# Patient Record
Sex: Female | Born: 1960 | Race: Black or African American | Hispanic: No | Marital: Married | Smoking: Former smoker
Health system: Southern US, Community
[De-identification: ages and names within clinical notes are randomized; demographics above are authoritative.]

## PROBLEM LIST (undated history)

## (undated) DIAGNOSIS — K589 Irritable bowel syndrome without diarrhea: Secondary | ICD-10-CM

## (undated) HISTORY — DX: Irritable bowel syndrome without diarrhea: K58.9

---

## 1991-10-17 HISTORY — PX: GALLBLADDER SURGERY: SHX652

## 2016-10-16 HISTORY — PX: HYSTEROSCOPY: SHX211

## 2017-08-13 LAB — HM HEPATITIS C SCREENING LAB: HM Hepatitis Screen: NEGATIVE

## 2017-10-16 DIAGNOSIS — K589 Irritable bowel syndrome without diarrhea: Secondary | ICD-10-CM

## 2017-10-16 HISTORY — DX: Irritable bowel syndrome, unspecified: K58.9

## 2018-07-24 ENCOUNTER — Encounter: Payer: Self-pay | Admitting: Family Medicine

## 2018-07-24 ENCOUNTER — Other Ambulatory Visit (HOSPITAL_COMMUNITY)
Admission: RE | Admit: 2018-07-24 | Discharge: 2018-07-24 | Disposition: A | Discharge status: Home / Self Care | Payer: Managed Care, Other (non HMO) | Source: Ambulatory Visit | Attending: Obstetrics and Gynecology | Admitting: Obstetrics and Gynecology

## 2018-07-24 ENCOUNTER — Ambulatory Visit (INDEPENDENT_AMBULATORY_CARE_PROVIDER_SITE_OTHER): Payer: Managed Care, Other (non HMO) | Admitting: Family Medicine

## 2018-07-24 ENCOUNTER — Encounter (INDEPENDENT_AMBULATORY_CARE_PROVIDER_SITE_OTHER): Payer: Self-pay

## 2018-07-24 ENCOUNTER — Encounter: Payer: Self-pay | Admitting: Obstetrics and Gynecology

## 2018-07-24 ENCOUNTER — Ambulatory Visit (INDEPENDENT_AMBULATORY_CARE_PROVIDER_SITE_OTHER): Payer: Managed Care, Other (non HMO) | Admitting: Obstetrics and Gynecology

## 2018-07-24 VITALS — BP 118/68 | HR 79 | Temp 98.6°F | Resp 16 | Ht 66.0 in | Wt 222.0 lb

## 2018-07-24 VITALS — BP 121/83 | HR 87 | Ht 66.0 in | Wt 222.0 lb

## 2018-07-24 DIAGNOSIS — R609 Edema, unspecified: Secondary | ICD-10-CM | POA: Diagnosis not present

## 2018-07-24 DIAGNOSIS — N951 Menopausal and female climacteric states: Secondary | ICD-10-CM

## 2018-07-24 DIAGNOSIS — K581 Irritable bowel syndrome with constipation: Secondary | ICD-10-CM

## 2018-07-24 DIAGNOSIS — E669 Obesity, unspecified: Secondary | ICD-10-CM

## 2018-07-24 DIAGNOSIS — Z1239 Encounter for other screening for malignant neoplasm of breast: Secondary | ICD-10-CM

## 2018-07-24 DIAGNOSIS — Z7689 Persons encountering health services in other specified circumstances: Secondary | ICD-10-CM

## 2018-07-24 DIAGNOSIS — Z0000 Encounter for general adult medical examination without abnormal findings: Secondary | ICD-10-CM

## 2018-07-24 DIAGNOSIS — L821 Other seborrheic keratosis: Secondary | ICD-10-CM

## 2018-07-24 MED ORDER — HYDROCHLOROTHIAZIDE 25 MG PO TABS: 25.0000 mg | ORAL_TABLET | Freq: Every day | ORAL | 5 refills | Status: DC

## 2018-07-24 NOTE — Progress Notes (Signed)
Subjective:    Patient ID: Kara Porter, female    DOB: Dec 11, 1960, 57 y.o.   MRN: [id number]  Kara Porter is a 57 y.o. female presenting on 07/24/2018 for Establish Care and Irritable Bowel Syndrome  Establish care with  New PCP today. Previously she was in [place] for 14 years, then [place] 1 yr, then [place]. Prior PCP Dr Orvil Feil through [facility] in [place].   HPI   Chronic IBS / History of prior Fatigue/Joint Pain / Fluid Retention Prior PCP Dr Orvil Feil thought maybe Fibromyalgia, trial few medicines in past, limited results. She did not believe this diagnosis, she had difficulty walking and pain, she has changed lifestyle to improve her health through nutrition switched to FODMAP diet, to help her mental and physical health. Improved her health with reduced inflammation, able to lose some weight. Now working on exercise regimen now with walking regularly low intensity 20-65min daily 5 x week, joined a gym and plan to  - Down 24 lbs approx - Diet changes has helped a whole lot, with reduced swelling and pain, most of her pain was in areas of knees, back, hip - Does not take in salt much but she retains fluid, on HCTZ not for HTN, she has had normal BP, has monitored it before. - Overall she has dramatically improved and continues to do better on current lifestyle regimen  Former Smoker She quit smoking in past with trials of chantix and NRT patches multiple times.  IBS Prior dx from previous PCP mostly constipation type prior to treatments and diet changes now improved background info - Since followed by nutritionist has improved diet and eliminated gluten and improved FODMAPs diet, she has also had issues with constipation. She has been taking OTC Metamucil and Miralax regularly for past 1-2 years, with good results. Previously she would have constipation q 3-4 days, now doing better more regular. Asking how long she can use this regimen. She drinks water or sparkling water and stopped drinking  soda. 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 She does not need referral ordered to derm, she plans to call them to schedule for routine skin check Additional social history She is married and her husband is living with his mother as caregiver temporarily, about 2 hr away.  Child in [place] and one in [place] Maintenance:  UTD screen Hep C 08/13/17  Breast CA Screen - followed by GYN - prior mammo through [facility], now ordered for [facility], prior abnormal w/ cystic breast findings usual require repeat.  Due TDap - will think on this and check records.  Flu shot at work, will send [place] copy of record.  Prior colonoscopy, see above - will request record from [facility]  DEXA per GYN  Depression screen PHQ 2/9 07/24/2018  Decreased Interest 0  Down, Depressed, Hopeless 0  PHQ - 2 Score 0    Past Medical History:  Diagnosis Date  . Irritable bowel syndrome  2019   Past Surgical History:  Procedure Laterality Date  . [place]  . [place]  . HYSTEROSCOPY  2018   Social History   Socioeconomic History  . Marital status: Married    Spouse name: Not on file  . Number of children: 2  . Years of education: College  . Highest education level: Bachelor's degree (e.g., BA, AB, BS)  Occupational History  . Occupation: HR Manager    Comment: [employer]  Social Needs  . Financial resource strain: Not on file  . Food insecurity:     Worry: Not on file    Inability: Not on file  . Transportation needs:    Medical: Not on file    Non-medical: Not on file  Tobacco Use  . Smoking status: Former Smoker    Packs/day: 0.75    Years: 6.00    Pack years: 4.50    Types: Cigarettes    Last attempt to quit: 2018    Years since quitting: 1.7  . Smokeless tobacco: Former Network engineer and Sexual Activity  . Alcohol use: Not Currently    Frequency: Never  . Drug use: Never  . Sexual activity: Not Currently    Birth control/protection: None  Lifestyle  . Physical activity:    Days per week: 5 days    Minutes per session: Not on file  . Stress: Rather much  Relationships  . Social connections:    Talks on phone: Three times a week    Gets together: Three times a week    Attends religious service: Not on file    Active member of club or organization: Not on file    Attends meetings of clubs or organizations: Not on file    Relationship status: Married  . Intimate partner violence:    Fear of current or ex partner: No    Emotionally abused: No    Physically abused: No    Forced sexual activity: No  Other Topics Concern  . Not on file  Social History Narrative  . Not on file   Family History  Problem Relation Age of Onset  . Hyperlipidemia Mother   . Heart disease Mother   . Cancer Father   . Hyperlipidemia Father    Current Outpatient Medications on File Prior to Visit  Medication Sig  . valACYclovir (VALTREX) 500 MG tablet Take 500 mg by mouth 3 (three) times daily as needed. PRN herpes flare   No current facility-administered medications on file prior to visit.     Review of Systems Per HPI unless specifically indicated above     Objective:    BP 118/68   Pulse 79   Temp 98.6 F (37 C) (Oral)   Resp 16   Ht 5\' 6"  (1.676 m)   Wt 222 lb (100.7 kg)   BMI 35.83 kg/m   Wt Readings from Last 3 Encounters:  07/24/18 222 lb (100.7 kg)  07/24/18 222 lb (100.7 kg)    Physical Exam    Constitutional: She is oriented to person, place, and time. She appears well-developed and well-nourished. No distress.  Well-appearing, comfortable, cooperative, obese  HENT:  Head: Normocephalic and atraumatic.  Mouth/Throat: Oropharynx is clear and moist.  Eyes: Conjunctivae are normal. Right eye exhibits no discharge. Left eye exhibits no discharge.  Cardiovascular: Normal rate.  Pulmonary/Chest: Effort normal.  Musculoskeletal: She exhibits no edema.  Neurological: She is alert and oriented to person, place, and time.  Skin: Skin is warm and dry. No rash noted. She is not diaphoretic. No erythema.  Several areas of freckles of skin face cheek area - some slightly raised SK appearing lesions, similar on neck, non tender, no asymmetry or abnormality  Psychiatric: She has a normal mood and affect. Her behavior is normal.  Well groomed, good eye contact, normal speech and thoughts  Nursing note and vitals reviewed.  Results for orders placed or performed in visit on 07/24/18  HM HEPATITIS C SCREENING LAB  Result Value Ref Range   HM Hepatitis Screen Negative-Validated       Assessment & Plan:   Problem List Items Addressed This Visit    Fluid retention Stable chronic issue related to inflammation by her report within body, does not seem to be other cause, improved and controlled on thiazide    Relevant Medications   hydrochlorothiazide (HYDRODIURIL) 25 MG tablet   Irritable bowel syndrome - Primary Primarily constipation type, now well controlled on current diet, lifestyle and med regimen metamucil/miralax for prevention - Follow-up results of prior colonoscopy - request record - In future if need further assistance / workup will consult GI    Obesity (BMI 35.0-39.9 without comorbidity) Gradual wt loss with lifestyle changes Encourage keep up the good work    Seborrheic keratoses Skin findings mostly consistent with benign SKs, but I support her establishing with new  Dermatology for routine surveillance with fam history of skin cancer     Other Visit Diagnoses    Encounter to establish care with new doctor     Request record from [facility] PCP and review in chart other record    Screening for breast cancer       Followed by GYN - they have ordered routine Mammo at [facility], advised patient we can send fax release to [facility] for her results from [facility], needs to sign      Meds ordered this encounter  Medications  . hydrochlorothiazide (HYDRODIURIL) 25 MG tablet    Sig: Take 1 tablet (25 mg total) by mouth daily.    Dispense:  30 tablet    Refill:  5    Patient has changed PCP from Dr Orvil Feil to myself, please update rx on file, fill when ready    Follow up plan: Return in about 4 weeks (around 08/21/2018) for Annual Physical.  Future labs will be ordered for 08/21/18  Nobie Putnam, [facility] 07/24/2018, 4:00 PM

## 2018-07-24 NOTE — Progress Notes (Signed)
HPI:      [name] is a 57 y.o. [id number] who LMP was Patient's last menstrual period was 11/24/2017 (exact date).  Subjective:   She presents today for her annual examination.  She has no specific complaints.  She has only had 2 periods in 2019.  She previously had an endometrial ablation for heavy bleeding and cramping.  She has also had fibroidectomy's for previous fibroids.  She complains of night sweats but denies daily hot flashes. She is up-to-date on mammography and colonoscopy.  She has never had a DEXA scan. She is seeing her family doctor later today who does most of her annual exam blood work.    Hx: The following portions of the patient's history were reviewed and updated as appropriate:             She  has a past medical history of Irritable bowel syndrome (2019). She does not have a problem list on file. She  has a past surgical history that includes Cesarean section (1991 and 1997); Gallbladder surgery (1993); and Hysteroscopy (2018). Her family history includes Cancer in her father; Heart disease in her mother; Hyperlipidemia in her father and mother. She  reports that she quit smoking about 21 months ago. She has never used smokeless tobacco. She reports that she drank alcohol. She reports that she does not use drugs. She has a current medication list which includes the following prescription(s): hydrochlorothiazide, multivitamin, polyethylene glycol, and psyllium. She has no allergies on file.       Review of Systems:  Review of Systems  Constitutional: Denied constitutional symptoms, night sweats, recent illness, fatigue, fever, insomnia and weight loss.  Eyes: Denied eye symptoms, eye pain, photophobia, vision change and visual disturbance.  Ears/Nose/Throat/Neck: Denied ear, nose, throat or neck symptoms, hearing loss, nasal discharge, sinus congestion and sore throat.  Cardiovascular: Denied cardiovascular symptoms, arrhythmia, chest pain/pressure, edema,  exercise intolerance, orthopnea and palpitations.  Respiratory: Denied pulmonary symptoms, asthma, pleuritic pain, productive sputum, cough, dyspnea and wheezing.  Gastrointestinal: Denied, gastro-esophageal reflux, melena, nausea and vomiting.  Genitourinary: Denied genitourinary symptoms including symptomatic vaginal discharge, pelvic relaxation issues, and urinary complaints.  Musculoskeletal: Denied musculoskeletal symptoms, stiffness, swelling, muscle weakness and myalgia.  Dermatologic: Denied dermatology symptoms, rash and scar.  Neurologic: Denied neurology symptoms, dizziness, headache, neck pain and syncope.  Psychiatric: Denied psychiatric symptoms, anxiety and depression.  Endocrine: Denied endocrine symptoms including hot flashes and night sweats.   Meds:   Current Outpatient Medications on File Prior to Visit  Medication Sig Dispense Refill  . hydrochlorothiazide (HYDRODIURIL) 25 MG tablet Take 25 mg by mouth daily.    . Multiple Vitamin (MULTIVITAMIN) LIQD Take 5 mLs by mouth daily.    . polyethylene glycol (MIRALAX / GLYCOLAX) packet Take 17 g by mouth daily.    . psyllium (METAMUCIL) 58.6 % packet Take 1 packet by mouth daily.     No current facility-administered medications on file prior to visit.     Objective:     Vitals:   07/24/18 1016  BP: 121/83  Pulse: 87              Physical examination General NAD, Conversant  HEENT Atraumatic; Op clear with mmm.  Normo-cephalic. Pupils reactive. Anicteric sclerae  Thyroid/Neck Smooth without nodularity or enlargement. Normal ROM.  Neck Supple.  Skin No rashes, lesions or ulceration. Normal palpated skin turgor. No nodularity.  Breasts: No masses or discharge.  Symmetric.  No axillary adenopathy.  Lungs: Clear to  auscultation.No rales or wheezes. Normal Respiratory effort, no retractions.  Heart: NSR.  No murmurs or rubs appreciated. No periferal edema  Abdomen: Soft.  Non-tender.  No masses.  No HSM. No hernia   Extremities: Moves all appropriately.  Normal ROM for age. No lymphadenopathy.  Neuro: Oriented to PPT.  Normal mood. Normal affect.     Pelvic:   Vulva: Normal appearance.  No lesions.  Vagina: No lesions or abnormalities noted.  Support: Normal pelvic support.  Urethra No masses tenderness or scarring.  Meatus Normal size without lesions or prolapse.  Cervix: Normal appearance.  No lesions.  Anus: Normal exam.  No lesions.  Perineum: Normal exam.  No lesions.        Bimanual   Uterus: Normal size.  Non-tender.  Mobile.  AV.  Adnexae: No masses.  Non-tender to palpation.  Cul-de-sac: Negative for abnormality.    Annual examination limited by patient body habitus.  Assessment:    [id number] There are no active problems to display for this patient.    1. Encounter for annual physical exam   2. Symptomatic menopausal or female climacteric states        Plan:            1.  Basic Screening Recommendations The basic screening recommendations for asymptomatic women were discussed with the patient during her visit.  The age-appropriate recommendations were discussed with her and the rational for the tests reviewed.  When I am informed by the patient that another primary care physician has previously obtained the age-appropriate tests and they are up-to-date, only outstanding tests are ordered and referrals given as necessary.  Abnormal results of tests will be discussed with her when all of her results are completed. Pap performed-co-test Mammogram ordered DEXA ordered Colonoscopy-patient says she is up-to-date  2.  FSH to rule in or out menopause.  Will discuss HRT if elevated FSH noted.  Orders No orders of the defined types were placed in this encounter.   No orders of the defined types were placed in this encounter.       F/U  Return for We will contact her with any abnormal test results.  Finis Bud, M.D. 07/24/2018 10:30 AM

## 2018-07-24 NOTE — Progress Notes (Signed)
Pt is here today for annual exam. Pt has had irregular periods for 3 years, her LMP was 11/2017. Pt's last pap was in 07/2017 and results were normal. Pt requests a referral for a mammogram.

## 2018-07-24 NOTE — Patient Instructions (Addendum)
Thank you for coming to the office today.  Keep up the good work overall  See if you can help [place] request the record for the Colonoscopy from [place] from 2014. Call them directly or let us know exactly where to send the release form.  Notify us when you get your Flu Shot for our records.  We will bill a preventative code at next visit FYI for the blood work.  [place] Dr. Ree Edman   [address], [place], [place] [address] Phone: [phone]  ---------------------------------------------- [place]   [place], [place] [address] Hours: 8AM-5PM Phone: [phone]  ----------------------------------------------  [facility] [place], [place] [address] Phone: [phone]   DUE for FASTING BLOOD WORK (no food or drink after midnight before the lab appointment, only water or coffee without cream/sugar on the morning of)  SCHEDULE "Lab Only" visit in the morning at the clinic for lab draw in [place]   - Make sure Lab Only appointment is at about 1 week before your next appointment, so that results will be available  For Lab Results, once available within 2-3 days of blood draw, you can can log in to MyChart online to view your results and a brief explanation. Also, we can discuss results at next follow-up visit.   Please schedule a Follow-up Appointment to: Return in about 4 weeks (around 08/21/2018) for Annual Physical.  If you have any other questions or concerns, please feel free to call the office or send a message through [place]. You may also schedule an earlier appointment if necessary.  Additionally, you may be receiving a survey about your experience at our office within a few days to 1 week by e-mail or mail. We value your feedback.  Nobie Putnam, DO [facility]

## 2018-07-24 NOTE — Addendum Note (Signed)
Addended by: Elmer Picker M on: 07/24/2018 11:21 AM   Modules accepted: Orders

## 2018-07-25 ENCOUNTER — Telehealth: Payer: Self-pay | Admitting: Family Medicine

## 2018-07-25 ENCOUNTER — Other Ambulatory Visit: Payer: Self-pay | Admitting: Family Medicine

## 2018-07-25 DIAGNOSIS — R609 Edema, unspecified: Secondary | ICD-10-CM

## 2018-07-25 DIAGNOSIS — E7800 Pure hypercholesterolemia, unspecified: Secondary | ICD-10-CM | POA: Insufficient documentation

## 2018-07-25 DIAGNOSIS — E669 Obesity, unspecified: Secondary | ICD-10-CM

## 2018-07-25 DIAGNOSIS — Z0000 Encounter for general adult medical examination without abnormal findings: Secondary | ICD-10-CM

## 2018-07-25 DIAGNOSIS — K581 Irritable bowel syndrome with constipation: Secondary | ICD-10-CM

## 2018-07-25 LAB — FOLLICLE STIMULATING HORMONE: FSH: 27.3 m[IU]/mL

## 2018-07-25 NOTE — Telephone Encounter (Signed)
Attempted to call patient back. She needs to sign a [employer] Form so that we can fax it back to [place] and they can request her records of prior mammogram from [facility].  Form is completed and in the folder at nursing station for her next appointment in November 2019.  If she prefers we can mail it or fax it to her, and all she has to do is [place] to [place].  Or she can come by and sign it and WE can fax it to [place].  Or she can wait until her November appointment and we can have her sign it here and then we can fax it.  Nobie Putnam, DO [facility] 07/25/2018, 1:55 PM

## 2018-07-26 ENCOUNTER — Telehealth: Payer: Self-pay

## 2018-07-26 LAB — CYTOLOGY - PAP
DIAGNOSIS: NEGATIVE
HPV (WINDOPATH): NOT DETECTED

## 2018-07-26 NOTE — Telephone Encounter (Signed)
Unable to reach pt on the phone, will try again later today.

## 2018-07-29 NOTE — Telephone Encounter (Signed)
Left Vm for patient to return call

## 2018-07-30 ENCOUNTER — Telehealth: Payer: Self-pay | Admitting: Obstetrics and Gynecology

## 2018-07-30 NOTE — Telephone Encounter (Signed)
Informed pt of pap results and advised her to make an appt with Dr. Amalia Hailey to discuss menopause. Pt will call back to make an appt after reviewing her schedule.

## 2018-07-30 NOTE — Telephone Encounter (Signed)
Patient returned your call.  Thanks!

## 2018-08-21 ENCOUNTER — Other Ambulatory Visit: Payer: Managed Care, Other (non HMO)

## 2018-08-21 DIAGNOSIS — K581 Irritable bowel syndrome with constipation: Secondary | ICD-10-CM

## 2018-08-21 DIAGNOSIS — Z0000 Encounter for general adult medical examination without abnormal findings: Secondary | ICD-10-CM

## 2018-08-21 DIAGNOSIS — R609 Edema, unspecified: Secondary | ICD-10-CM

## 2018-08-21 DIAGNOSIS — E7800 Pure hypercholesterolemia, unspecified: Secondary | ICD-10-CM

## 2018-08-21 DIAGNOSIS — E669 Obesity, unspecified: Secondary | ICD-10-CM

## 2018-08-22 LAB — COMPLETE METABOLIC PANEL WITH GFR
AG Ratio: 1.6 (calc) (ref 1.0–2.5)
ALBUMIN MSPROF: 4.4 g/dL (ref 3.6–5.1)
ALKALINE PHOSPHATASE (APISO): 100 U/L (ref 33–130)
ALT: 18 U/L (ref 6–29)
AST: 17 U/L (ref 10–35)
BILIRUBIN TOTAL: 0.4 mg/dL (ref 0.2–1.2)
BUN: 12 mg/dL (ref 7–25)
CHLORIDE: 103 mmol/L (ref 98–110)
CO2: 28 mmol/L (ref 20–32)
CREATININE: 0.65 mg/dL (ref 0.50–1.05)
Calcium: 9.4 mg/dL (ref 8.6–10.4)
GFR, Est African American: 116 mL/min/{1.73_m2} (ref >=60)
GFR, Est Non African American: 100 mL/min/{1.73_m2} (ref >=60)
GLUCOSE: 100 mg/dL — AB (ref 65–99)
Globulin: 2.8 g/dL (calc) (ref 1.9–3.7)
Potassium: 3.9 mmol/L (ref 3.5–5.3)
Sodium: 138 mmol/L (ref 135–146)
Total Protein: 7.2 g/dL (ref 6.1–8.1)

## 2018-08-22 LAB — CBC WITH DIFFERENTIAL/PLATELET
Basophils Absolute: 34 cells/uL (ref 0–200)
Basophils Relative: 0.4 %
EOS PCT: 0.6 %
Eosinophils Absolute: 51 cells/uL (ref 15–500)
HEMATOCRIT: 40.5 % (ref 35.0–45.0)
HEMOGLOBIN: 13.2 g/dL (ref 11.7–15.5)
LYMPHS ABS: 2601 {cells}/uL (ref 850–3900)
MCH: 30 pg (ref 27.0–33.0)
MCHC: 32.6 g/dL (ref 32.0–36.0)
MCV: 92 fL (ref 80.0–100.0)
MPV: 9.9 fL (ref 7.5–12.5)
Monocytes Relative: 6 %
NEUTROS ABS: 5304 {cells}/uL (ref 1500–7800)
NEUTROS PCT: 62.4 %
Platelets: 429 10*3/uL — ABNORMAL HIGH (ref 140–400)
RBC: 4.4 10*6/uL (ref 3.80–5.10)
RDW: 12.7 % (ref 11.0–15.0)
Total Lymphocyte: 30.6 %
WBC: 8.5 10*3/uL (ref 3.8–10.8)
WBCMIX: 510 {cells}/uL (ref 200–950)

## 2018-08-22 LAB — HEMOGLOBIN A1C
Hgb A1c MFr Bld: 5.7 % of total Hgb — ABNORMAL HIGH (ref <5.7)
MEAN PLASMA GLUCOSE: 117 (calc)
eAG (mmol/L): 6.5 (calc)

## 2018-08-22 LAB — LIPID PANEL
CHOL/HDL RATIO: 4.5 (calc) (ref <5.0)
Cholesterol: 221 mg/dL — ABNORMAL HIGH (ref <200)
HDL: 49 mg/dL — ABNORMAL LOW (ref >50)
LDL Cholesterol (Calc): 145 mg/dL (calc) — ABNORMAL HIGH
NON-HDL CHOLESTEROL (CALC): 172 mg/dL — AB (ref <130)
TRIGLYCERIDES: 142 mg/dL (ref <150)

## 2018-08-22 LAB — TSH: TSH: 1.44 m[IU]/L

## 2018-08-22 LAB — T4, FREE: Free T4: 0.9 ng/dL (ref 0.8–1.8)

## 2018-08-25 ENCOUNTER — Encounter: Payer: Self-pay | Admitting: Family Medicine

## 2018-08-25 DIAGNOSIS — R7303 Prediabetes: Secondary | ICD-10-CM | POA: Insufficient documentation

## 2018-08-25 DIAGNOSIS — R7309 Other abnormal glucose: Secondary | ICD-10-CM | POA: Insufficient documentation

## 2018-08-25 DIAGNOSIS — E1169 Type 2 diabetes mellitus with other specified complication: Secondary | ICD-10-CM | POA: Insufficient documentation

## 2018-08-28 ENCOUNTER — Encounter: Payer: Self-pay | Admitting: Family Medicine

## 2018-08-28 ENCOUNTER — Ambulatory Visit (INDEPENDENT_AMBULATORY_CARE_PROVIDER_SITE_OTHER): Payer: Managed Care, Other (non HMO) | Admitting: Family Medicine

## 2018-08-28 VITALS — BP 111/62 | HR 79 | Temp 98.5°F | Resp 16 | Ht 66.0 in | Wt 223.0 lb

## 2018-08-28 DIAGNOSIS — K581 Irritable bowel syndrome with constipation: Secondary | ICD-10-CM

## 2018-08-28 DIAGNOSIS — R7309 Other abnormal glucose: Secondary | ICD-10-CM | POA: Diagnosis not present

## 2018-08-28 DIAGNOSIS — Z0000 Encounter for general adult medical examination without abnormal findings: Secondary | ICD-10-CM

## 2018-08-28 DIAGNOSIS — E7800 Pure hypercholesterolemia, unspecified: Secondary | ICD-10-CM

## 2018-08-28 DIAGNOSIS — E669 Obesity, unspecified: Secondary | ICD-10-CM | POA: Diagnosis not present

## 2018-08-28 DIAGNOSIS — L239 Allergic contact dermatitis, unspecified cause: Secondary | ICD-10-CM

## 2018-08-28 DIAGNOSIS — D75839 Thrombocytosis, unspecified: Secondary | ICD-10-CM

## 2018-08-28 DIAGNOSIS — D473 Essential (hemorrhagic) thrombocythemia: Secondary | ICD-10-CM

## 2018-08-28 MED ORDER — TRIAMCINOLONE ACETONIDE 0.1 % EX CREA: 1.0000 "application " | TOPICAL_CREAM | Freq: Two times a day (BID) | CUTANEOUS | 0 refills | Status: DC | PRN

## 2018-08-28 NOTE — Assessment & Plan Note (Signed)
Weight stable Encourage improved lifestyle

## 2018-08-28 NOTE — Assessment & Plan Note (Signed)
Controlled cholesterol on lifestyle Last lipid panel 08/2018 Calculated ASCVD 10 yr risk score 3%  Plan: 1. Encourage improved lifestyle - low carb/cholesterol, reduce portion size, continue improving regular exercise Follow-up yearly lipid

## 2018-08-28 NOTE — Assessment & Plan Note (Signed)
Clinically stable chronic IBS-C Improved on diet changes/lifestyle Gluten free FODMAPS On miralax, metamucil  Future refer to GI in future if needed

## 2018-08-28 NOTE — Patient Instructions (Addendum)
Thank you for coming to the office today.  Keep up the good work!  Cholesterol is improved from last year. Still slightly elevated LDL. But towards the goal.  A1c 5.7, borderline Pre-Diabetic still. Keep improving diet / activity / weight loss.  Will follow-up Mammogram and DEXA once completed.  Discuss hormone with GYN as well - my preference would be to avoid if you are not having significant symptoms.  Please schedule a Follow-up Appointment to: Return in about 6 months (around 02/26/2019) for 6 month follow-up PreDM A1c, Weight, IBS.  If you have any other questions or concerns, please feel free to call the office or send a message through [facility]. You may also schedule an earlier appointment if necessary.  Additionally, you may be receiving a survey about your experience at our office within a few days to 1 week by e-mail or mail. We value your feedback.  Nobie Putnam, DO [place]

## 2018-08-28 NOTE — Assessment & Plan Note (Addendum)
Stable A1c 5.7, no prior readings Concern with obesity, HLD  Plan:  1. Not on any therapy currently  2. Encourage improved lifestyle - low carb, low sugar diet, reduce portion size, continue improving regular exercise 3. Follow-up 6 month PreDM A1c

## 2018-08-28 NOTE — Assessment & Plan Note (Signed)
Mild elevated plt, 400-430 range, stable for past >2 years Asymptomatic Otherwise normal CBC  Follow yearly CBC or sooner if abnormal symptoms or new concern. Consider path smear vs hematology if need

## 2018-08-28 NOTE — Progress Notes (Signed)
Subjective:    Patient ID: Kara Porter, female    DOB: Mar 07, 1961, 57 y.o.   MRN: [id number]  Kara Porter is a 57 y.o. female presenting on 08/28/2018 for Annual Exam   HPI   Here for Annual Physical and Lab Review.  IBS Prior dx from previous PCP mostly constipation type prior to treatments and diet changes now improved background info - Since followed by nutritionist has improved diet and eliminated gluten and improved FODMAPs diet, she has also had issues with constipation. She has been taking OTC Metamucil and Miralax regularly for past 1-2 years, with good results. - Today reports doing well. Limited flares of constipation  Former Smoker She quit smoking in past with trials of chantix and NRT patches multiple times.  Pre-Diabetes / Obesity BMI >35 Reports prior history elevated A1c mild in past, no result available. Now recent reading 5.7 CBGs: Not checking sugar Meds: Never on Currently not on ACEi/ARB Denies hypoglycemia  Seasonal Allergic Rhinitis Improved on Tylenol Sinus PRN, Flonase, and occasional Claritin, uses Neti Pot PRN  HYPERLIPIDEMIA: - Reports no concerns. Last lipid panel 08/2018, normal HDL and TG, slightly elevated Total Cholesterol and LDL 145, but improved from 2018 with LDL 165 - Not on cholesterol medicine  Thrombocytosis, elevated platelets Prior history of mild elevated platelets for past 2 years+, with range 400 to 430. She was unaware of this result. 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 - She has not scheduled yet with Dermatology - She has one area on  her chest wall between breasts with two itchy spots, had flaking skin, had issue with fungal infection of skin already treated with powder improved within skin fold beneath breasts, due to moisture  PMH - History of prior Fatigue/Joint Pain / Fluid Retention - see prior note for background history, improved on FODMAP diet and gluten free.  Post menopause Followed by [facility], Dr Amalia Hailey, has seen recently, they did lab testing for menopause and patient will follow-up to discuss HRT. She is less interested in medication at this time because she does not endorse significant post menopausal symptoms. Only occasional sweat and some wt gain, otherwise doing well.   Health Maintenance:  UTD screen Hep C 08/13/17  Breast CA Screen - followed by GYN - prior mammo through [facility], now ordered for [facility], prior abnormal w/ cystic breast findings usual require repeat. - SCHEDULED for 09/18/18  Due TDap - declines today  Flu shot at work, will send [place] copy of record - this month 08/2018  Prior colonoscopy, see above - already requested record from [facility] did not receive copy yet. She states it was done past few years, no polyps, next due reported at age 50.  She is already scheduled for DEXA and Mammogram 09/18/2018   Depression screen PHQ 2/9 08/28/2018 07/24/2018  Decreased Interest 0 0  Down, Depressed, Hopeless 0 0  PHQ - 2 Score 0 0    Past Medical History:  Diagnosis Date  . Irritable bowel syndrome 2019   Past Surgical History:  Procedure Laterality  Date  . [place] and 1997  . [place]  . HYSTEROSCOPY  2018   Social History   Socioeconomic History  . Marital status: Married    Spouse name: Not on file  . Number of children: 2  . Years of education: College  . Highest education level: Bachelor's degree (e.g., BA, AB, BS)  Occupational History  . Occupation: HR Manager    Comment: [employer]  Social Needs  .  Financial resource strain: Not on file  . Food insecurity:    Worry: Not on file    Inability: Not on file  . Transportation needs:    Medical: Not on file    Non-medical: Not on file  Tobacco Use  . Smoking status: Former Smoker    Packs/day: 0.75    Years: 6.00    Pack years: 4.50    Types: Cigarettes    Last attempt to quit: 2018    Years since quitting: 1.8  . Smokeless tobacco: Former Network engineer and Sexual Activity  . Alcohol use: Not Currently    Frequency: Never  . Drug use: Never  . Sexual activity: Not Currently    Birth control/protection: None  Lifestyle  . Physical activity:    Days per week: 5 days    Minutes per session: Not on file  . Stress: Rather much  Relationships  . Social connections:    Talks on phone: Three times a week    Gets together: Three times a week    Attends religious service: Not on file    Active member of club or organization: Not on file    Attends meetings of clubs or organizations: Not on file    Relationship status: Married  . Intimate partner violence:    Fear of current or ex partner: No    Emotionally abused: No    Physically abused: No    Forced sexual activity: No  Other Topics Concern  . Not on file  Social History Narrative  . Not on file   Family History  Problem Relation Age of Onset  . Hyperlipidemia Mother   . Heart disease Mother   . Cancer Father   . Hyperlipidemia Father    Current Outpatient Medications on File Prior to Visit  Medication Sig  . hydrochlorothiazide (HYDRODIURIL) 25 MG tablet Take 1 tablet (25 mg total) by mouth daily.  . Multiple Vitamin (MULTIVITAMIN) LIQD Take 5 mLs by mouth daily.  . polyethylene glycol (MIRALAX / GLYCOLAX) packet Take 17 g by mouth daily.  . psyllium (METAMUCIL) 58.6 % packet Take 1 packet by mouth daily.  . valACYclovir (VALTREX) 500 MG tablet Take 500 mg by mouth 3 (three) times daily as needed. PRN herpes flare   No current facility-administered medications  on file prior to visit.     Review of Systems  Constitutional: Negative for activity change, appetite change, chills, diaphoresis, fatigue and fever.  HENT: Negative for congestion and hearing loss.   Eyes: Negative for visual disturbance.  Respiratory: Negative for apnea, cough, chest tightness, shortness of breath and wheezing.   Cardiovascular: Negative for chest pain, palpitations and leg swelling.  Gastrointestinal: Negative for abdominal pain, anal bleeding, blood in stool, constipation (Improved), diarrhea (Improved), nausea and vomiting.  Endocrine: Negative for cold intolerance.  Genitourinary: Negative for difficulty urinating, dysuria, frequency, hematuria, vaginal bleeding, vaginal discharge and vaginal pain.  Musculoskeletal: Negative for arthralgias and neck pain.  Skin: Positive for rash (fungal rash on  chest under breast has improved, now has area of itchy spot between breasts).  Allergic/Immunologic: Negative for environmental allergies.  Neurological: Negative for dizziness, weakness, light-headedness, numbness and headaches.  Hematological: Negative for adenopathy. Bruises/bleeds easily.  Psychiatric/Behavioral: Negative for behavioral problems, dysphoric mood and sleep disturbance. The patient is not nervous/anxious.    Per HPI unless specifically indicated above     Objective:    BP 111/62   Pulse 79   Temp 98.5 F (36.9 C) (Oral)   Resp 16   Ht 5\' 6"  (1.676 m)   Wt 223 lb (101.2 kg)   BMI 35.99 kg/m   Wt Readings from Last 3 Encounters:  08/28/18 223 lb (101.2 kg)  07/24/18 222 lb (100.7 kg)  07/24/18 222 lb (100.7 kg)    Physical Exam  Constitutional: She is oriented to person, place, and time. She appears well-developed and well-nourished. No distress.  Well-appearing, comfortable, cooperative  HENT:  Head: Normocephalic and atraumatic.  Mouth/Throat: Oropharynx is clear and moist.  Frontal / maxillary sinuses non-tender. Nares with mild turbinate  edema and congestion without purulence. Bilateral TMs clear without erythema, effusion or bulging, L ear canal with mild cerumen, non impacting. Oropharynx clear without erythema, exudates, edema or asymmetry.  Eyes: Pupils are equal, round, and reactive to light. Conjunctivae and EOM are normal. Right eye exhibits no discharge. Left eye exhibits no discharge.  Neck: Normal range of motion. Neck supple. No thyromegaly present.  Cardiovascular: Normal rate, regular rhythm, normal heart sounds and intact distal pulses.  No murmur heard. Pulmonary/Chest: Effort normal and breath sounds normal. No respiratory distress. She has no wheezes. She has no rales.    Abdominal: Soft. Bowel sounds are normal. She exhibits no distension and no mass. There is no tenderness.  Musculoskeletal: Normal range of motion. She exhibits no edema or tenderness.  Upper / Lower Extremities: - Normal muscle tone, strength bilateral upper extremities 5/5, lower extremities 5/5  Lymphadenopathy:    She has no cervical adenopathy.  Neurological: She is alert and oriented to person, place, and time.  Distal sensation intact to light touch all extremities  Skin: Skin is warm and dry. No rash noted. She is not diaphoretic. No erythema.  Psychiatric: She has a normal mood and affect. Her behavior is normal.  Well groomed, good eye contact, normal speech and thoughts  Nursing note and vitals reviewed.    Results for orders placed or performed in visit on 08/21/18  T4, free  Result Value Ref Range   Free T4 0.9 0.8 - 1.8 ng/dL  TSH  Result Value Ref Range   TSH 1.44 mIU/L  Lipid panel  Result Value Ref Range   Cholesterol 221 (H) <200 mg/dL   HDL 49 (L) >50 mg/dL   Triglycerides 142 <150 mg/dL   LDL Cholesterol (Calc) 145 (H) mg/dL (calc)   Total CHOL/HDL Ratio 4.5 <5.0 (calc)   Non-HDL Cholesterol (Calc) 172 (H) <130 mg/dL (calc)  COMPLETE METABOLIC PANEL WITH GFR  Result Value Ref Range   Glucose, Bld 100 (H) 65  - 99 mg/dL   BUN 12 7 - 25 mg/dL   Creat 0.65 0.50 - 1.05 mg/dL   GFR, Est Non African American 100 > OR = 60 mL/min/1.84m2   GFR, Est African American 116 > OR = 60 mL/min/1.15m2   BUN/Creatinine Ratio NOT APPLICABLE 6 - 22 (calc)   Sodium 138 135 - 146 mmol/L   Potassium 3.9 3.5 - 5.3 mmol/L   Chloride 103 98 -  110 mmol/L   CO2 28 20 - 32 mmol/L   Calcium 9.4 8.6 - 10.4 mg/dL   Total Protein 7.2 6.1 - 8.1 g/dL   Albumin 4.4 3.6 - 5.1 g/dL   Globulin 2.8 1.9 - 3.7 g/dL (calc)   AG Ratio 1.6 1.0 - 2.5 (calc)   Total Bilirubin 0.4 0.2 - 1.2 mg/dL   Alkaline phosphatase (APISO) 100 33 - 130 U/L   AST 17 10 - 35 U/L   ALT 18 6 - 29 U/L  CBC with Differential/Platelet  Result Value Ref Range   WBC 8.5 3.8 - 10.8 Thousand/uL   RBC 4.40 3.80 - 5.10 Million/uL   Hemoglobin 13.2 11.7 - 15.5 g/dL   HCT 40.5 35.0 - 45.0 %   MCV 92.0 80.0 - 100.0 fL   MCH 30.0 27.0 - 33.0 pg   MCHC 32.6 32.0 - 36.0 g/dL   RDW 12.7 11.0 - 15.0 %   Platelets 429 (H) 140 - 400 Thousand/uL   MPV 9.9 7.5 - 12.5 fL   Neutro Abs 5,304 1,500 - 7,800 cells/uL   Lymphs Abs 2,601 850 - 3,900 cells/uL   WBC mixed population 510 200 - 950 cells/uL   Eosinophils Absolute 51 15 - 500 cells/uL   Basophils Absolute 34 0 - 200 cells/uL   Neutrophils Relative % 62.4 %   Total Lymphocyte 30.6 %   Monocytes Relative 6.0 %   Eosinophils Relative 0.6 %   Basophils Relative 0.4 %  Hemoglobin A1c  Result Value Ref Range   Hgb A1c MFr Bld 5.7 (H) <5.7 % of total Hgb   Mean Plasma Glucose 117 (calc)   eAG (mmol/L) 6.5 (calc)      Assessment & Plan:   Problem List Items Addressed This Visit    Elevated hemoglobin A1c    Stable A1c 5.7, no prior readings Concern with obesity, HLD  Plan:  1. Not on any therapy currently  2. Encourage improved lifestyle - low carb, low sugar diet, reduce portion size, continue improving regular exercise 3. Follow-up 6 month PreDM A1c       Hypercholesteremia    Controlled  cholesterol on lifestyle Last lipid panel 08/2018 Calculated ASCVD 10 yr risk score 3%  Plan: 1. Encourage improved lifestyle - low carb/cholesterol, reduce portion size, continue improving regular exercise Follow-up yearly lipid       Irritable bowel syndrome    Clinically stable chronic IBS-C Improved on diet changes/lifestyle Gluten free FODMAPS On miralax, metamucil  Future refer to GI in future if needed      Obesity (BMI 35.0-39.9 without comorbidity)    Weight stable Encourage improved lifestyle      Thrombocytosis (HCC)    Mild elevated plt, 400-430 range, stable for past >2 years Asymptomatic Otherwise normal CBC  Follow yearly CBC or sooner if abnormal symptoms or new concern. Consider path smear vs hematology if need       Other Visit Diagnoses    Annual physical exam    -  Primary   Allergic contact dermatitis, unspecified trigger       Likely etiology of skin lesion chest wall. Possible changed SK lesion inflamed. Will try topical triamcinolone 0.1% cream BID 1-2 wk   Relevant Medications   triamcinolone cream (KENALOG) 0.1 %      Updated Health Maintenance information Reviewed recent lab results with patient Encouraged improvement to lifestyle with diet and exercise - Goal of weight loss   Meds ordered this encounter  Medications  .  triamcinolone cream (KENALOG) 0.1 %    Sig: Apply 1 application topically 2 (two) times daily as needed. For 1-2 weeks. Do not use on face.    Dispense:  30 g    Refill:  0    Follow up plan: Return in about 6 months (around 02/26/2019) for 6 month follow-up PreDM A1c, Weight, IBS.  Nobie Putnam, [facility] 08/28/2018, 2:51 PM

## 2018-09-18 ENCOUNTER — Ambulatory Visit
Admission: RE | Admit: 2018-09-18 | Discharge: 2018-09-18 | Disposition: A | Discharge status: Home / Self Care | Payer: Managed Care, Other (non HMO) | Source: Ambulatory Visit | Attending: Obstetrics and Gynecology | Admitting: Obstetrics and Gynecology

## 2018-09-18 ENCOUNTER — Encounter: Payer: Self-pay | Admitting: Radiology

## 2018-09-18 DIAGNOSIS — Z0000 Encounter for general adult medical examination without abnormal findings: Secondary | ICD-10-CM

## 2018-10-17 DIAGNOSIS — R350 Frequency of micturition: Secondary | ICD-10-CM | POA: Diagnosis not present

## 2018-10-17 DIAGNOSIS — R0981 Nasal congestion: Secondary | ICD-10-CM | POA: Diagnosis not present

## 2018-10-17 DIAGNOSIS — R5383 Other fatigue: Secondary | ICD-10-CM | POA: Diagnosis not present

## 2018-12-15 DIAGNOSIS — R509 Fever, unspecified: Secondary | ICD-10-CM | POA: Diagnosis not present

## 2018-12-15 DIAGNOSIS — R05 Cough: Secondary | ICD-10-CM | POA: Diagnosis not present

## 2018-12-15 DIAGNOSIS — R6889 Other general symptoms and signs: Secondary | ICD-10-CM | POA: Diagnosis not present

## 2018-12-15 DIAGNOSIS — J22 Unspecified acute lower respiratory infection: Secondary | ICD-10-CM | POA: Diagnosis not present

## 2018-12-19 ENCOUNTER — Encounter: Payer: Self-pay | Admitting: Family Medicine

## 2018-12-19 ENCOUNTER — Ambulatory Visit (INDEPENDENT_AMBULATORY_CARE_PROVIDER_SITE_OTHER): Payer: BLUE CROSS/BLUE SHIELD | Admitting: Family Medicine

## 2018-12-19 ENCOUNTER — Other Ambulatory Visit: Payer: Self-pay

## 2018-12-19 VITALS — BP 123/76 | HR 74 | Temp 98.3°F | Resp 16 | Ht 66.0 in | Wt 223.0 lb

## 2018-12-19 DIAGNOSIS — G933 Postviral fatigue syndrome: Secondary | ICD-10-CM

## 2018-12-19 DIAGNOSIS — J9801 Acute bronchospasm: Secondary | ICD-10-CM

## 2018-12-19 DIAGNOSIS — G9331 Postviral fatigue syndrome: Secondary | ICD-10-CM

## 2018-12-19 MED ORDER — BENZONATATE 100 MG PO CAPS: 100.0000 mg | ORAL_CAPSULE | Freq: Three times a day (TID) | ORAL | 0 refills | Status: DC | PRN

## 2018-12-19 MED ORDER — ALBUTEROL SULFATE 108 (90 BASE) MCG/ACT IN AEPB: 2.0000 | INHALATION_SPRAY | Freq: Four times a day (QID) | RESPIRATORY_TRACT | 0 refills | Status: DC | PRN

## 2018-12-19 MED ORDER — IPRATROPIUM BROMIDE 0.06 % NA SOLN: 2.0000 | Freq: Four times a day (QID) | NASAL | 0 refills | Status: DC

## 2018-12-19 NOTE — Progress Notes (Signed)
Subjective:    Patient ID: Kara Porter, female    DOB: 06-02-1961, 58 y.o.   MRN: [id number]  Dewayne Jurek is a 58 y.o. female presenting on 12/19/2018 for Cough (went to urgent care at [facility] on Sunday treated for flu and pneumonia for lower left lung, wants clearance for work since she is still not feeling well)   HPI   Urgent Care FOLLOW-UP VISIT  Hospital/Location: [facility] Date of UC Visit: 12/15/18  Reason for Presenting to UC: Fever, chills Flu like Primary (+Secondary) Diagnosis: Influenza  FOLLOW-UP  - ED provider note and record have been reviewed - Patient presents today about 4 days after recent Urgent Care visit. Brief summary of recent course, patient had symptoms of fever, cough flu like symptoms for few day, testing at urgent care with rapid flu swab (negative for A and B) also chest x-ray was read as negative for pneumonia by radiology but treating physician viewed and thought they correlated breath sounds with possible early pneumonia or developing problem in Right lower lung, treated with Tamiflu and Azithromycin, as well as cough syrup w/ codeine.  - Today reports overall has done fairly well after discharge from ED. She is about 50% better now 4 days later. Symptoms of cough is still present some wheezing at night. Admits occasional dizziness but not spinning, just not quite normal. Still tired and run down, today is first day leaving house.  - New medications on discharge: Azithromycin Zpak, Tamiflu treatment dose, Cough syrup codeine - Changes to current meds on discharge: none  She has note to go back to work tomorrow, does not feel like she has enough energy still tired and fatigued also she is worried about going to conference next week.  Admits some loose bowels, eating only liquids and soup mostly Admits mild headache Denies any return of fevers or chills sweats - now resolved Denies any nausea vomiting   Depression screen [facility] 2/9  12/19/2018 08/28/2018 07/24/2018  Decreased Interest 0 0 0  Down, Depressed, Hopeless 0 0 0  PHQ - 2 Score 0 0 0    Social History   Tobacco Use  . Smoking status: Former Smoker    Packs/day: 0.75    Years: 6.00    Pack years: 4.50    Types: Cigarettes    Last attempt to quit: 2018    Years since quitting: 2.1  . Smokeless tobacco: Former Network engineer Use Topics  . Alcohol use: Not Currently    Frequency: Never  . Drug use: Never    Review of Systems Per HPI unless specifically indicated above     Objective:    BP 123/76   Pulse 74   Temp 98.3 F (36.8 C) (Oral)   Resp 16   Ht 5\' 6"  (1.676 m)   Wt 223 lb (101.2 kg)   LMP 11/24/2017 (Exact Date)   SpO2 97%   BMI 35.99 kg/m   Wt Readings from Last 3 Encounters:  12/19/18 223 lb (101.2 kg)  08/28/18 223 lb (101.2 kg)  07/24/18 222 lb (100.7 kg)    Physical Exam Vitals signs and nursing note reviewed.  Constitutional:      General: She is not in acute distress.    Appearance: She is well-developed. She is not diaphoretic.     Comments: Mostly well but still slightly tired appearing, comfortable, cooperative  HENT:     Head: Normocephalic and atraumatic.     Comments: Frontal / maxillary sinuses  non-tender. Nares patent without congestion  Bilateral TMs clear without erythema, effusion or bulging. Oropharynx clear without erythema, exudates, edema or asymmetry. Eyes:     General:        Right eye: No discharge.        Left eye: No discharge.     Conjunctiva/sclera: Conjunctivae normal.  Neck:     Musculoskeletal: Normal range of motion and neck supple.     Thyroid: No thyromegaly.  Cardiovascular:     Rate and Rhythm: Normal rate and regular rhythm.     Heart sounds: Normal heart sounds. No murmur.  Pulmonary:     Effort: Pulmonary effort is normal. No respiratory distress.     Breath sounds: Normal breath sounds. No wheezing or rales.     Comments: No focal crackles or wheezing on exam, including RLL.  Slightly reduced air movement but overall no significant abnormality. Occasional coughing spell. Musculoskeletal: Normal range of motion.  Lymphadenopathy:     Cervical: No cervical adenopathy.  Skin:    General: Skin is warm and dry.     Findings: No erythema or rash.  Neurological:     Mental Status: She is alert and oriented to person, place, and time.  Psychiatric:        Behavior: Behavior normal.     Comments: Well groomed, good eye contact, normal speech and thoughts       I have personally reviewed the radiology report from 12/15/18 [facility].  X-ray chest PA and lateral3/10/2018 [place] Result Narrative  XR CHEST PA AND LATERAL  History: R05 Cough.  Comparison: None.  Frontal and lateral chest 12/15/2018 5:07 PM hours.   Heart size within normal limits.  Lungs show no focal consolidation.  Dextrothoracic scoliosis.  Electronically Signed by: Sarita Bottom, MD, [facility] Electronically Signed on: 12/15/2018 5:16 PM      Results for orders placed or performed in visit on 08/21/18  T4, free  Result Value Ref Range   Free T4 0.9 0.8 - 1.8 ng/dL  TSH  Result Value Ref Range   TSH 1.44 mIU/L  Lipid panel  Result Value Ref Range   Cholesterol 221 (H) <200 mg/dL   HDL 49 (L) >50 mg/dL   Triglycerides 142 <150 mg/dL   LDL Cholesterol (Calc) 145 (H) mg/dL (calc)   Total CHOL/HDL Ratio 4.5 <5.0 (calc)   Non-HDL Cholesterol (Calc) 172 (H) <130 mg/dL (calc)  COMPLETE METABOLIC PANEL WITH GFR  Result Value Ref Range   Glucose, Bld 100 (H) 65 - 99 mg/dL   BUN 12 7 - 25 mg/dL   Creat 0.65 0.50 - 1.05 mg/dL   GFR, Est Non African American 100 > OR = 60 mL/min/1.48m2   GFR, Est African American 116 > OR = 60 mL/min/1.83m2   BUN/Creatinine Ratio NOT APPLICABLE 6 - 22 (calc)   Sodium 138 135 - 146 mmol/L   Potassium 3.9 3.5 - 5.3 mmol/L   Chloride 103 98 - 110 mmol/L   CO2 28 20 - 32 mmol/L   Calcium 9.4 8.6 -  10.4 mg/dL   Total Protein 7.2 6.1 - 8.1 g/dL   Albumin 4.4 3.6 - 5.1 g/dL   Globulin 2.8 1.9 - 3.7 g/dL (calc)   AG Ratio 1.6 1.0 - 2.5 (calc)   Total Bilirubin 0.4 0.2 - 1.2 mg/dL   Alkaline phosphatase (APISO) 100 33 - 130 U/L   AST 17 10 - 35 U/L   ALT 18  6 - 29 U/L  CBC with Differential/Platelet  Result Value Ref Range   WBC 8.5 3.8 - 10.8 Thousand/uL   RBC 4.40 3.80 - 5.10 Million/uL   Hemoglobin 13.2 11.7 - 15.5 g/dL   HCT 40.5 35.0 - 45.0 %   MCV 92.0 80.0 - 100.0 fL   MCH 30.0 27.0 - 33.0 pg   MCHC 32.6 32.0 - 36.0 g/dL   RDW 12.7 11.0 - 15.0 %   Platelets 429 (H) 140 - 400 Thousand/uL   MPV 9.9 7.5 - 12.5 fL   Neutro Abs 5,304 1,500 - 7,800 cells/uL   Lymphs Abs 2,601 850 - 3,900 cells/uL   WBC mixed population 510 200 - 950 cells/uL   Eosinophils Absolute 51 15 - 500 cells/uL   Basophils Absolute 34 0 - 200 cells/uL   Neutrophils Relative % 62.4 %   Total Lymphocyte 30.6 %   Monocytes Relative 6.0 %   Eosinophils Relative 0.6 %   Basophils Relative 0.4 %  Hemoglobin A1c  Result Value Ref Range   Hgb A1c MFr Bld 5.7 (H) <5.7 % of total Hgb   Mean Plasma Glucose 117 (calc)   eAG (mmol/L) 6.5 (calc)      Assessment & Plan:   Problem List Items Addressed This Visit    None    Visit Diagnoses    Post-influenza syndrome    -  Primary   Relevant Medications   ipratropium (ATROVENT) 0.06 % nasal spray   Cough due to bronchospasm       Relevant Medications   benzonatate (TESSALON) 100 MG capsule   Albuterol Sulfate (PROAIR RESPICLICK) 829 (90 Base) MCG/ACT AEPB      IMPROVING suspected influenza vs bronchitis - now seems to be post flu syndrome lingering cough, may still have bronchitis that is improving. - Recent urgent care flu test and x-ray were negative - I do not appreciate any abnormal breath sounds focally in RLL today, seems improved from last eval on 3/1 - No other focal findings of infection today - Did not receive influenza vaccine this  season  Plan: 1. FINISH Tamiflu and Azithromycin Z-pak course - covered already from urgent care for flu and bronchitis/walking pneumonia - Continue cough syrup codeine at night PRN  ADD - Start Tessalon Perls take 1 capsule up to 3 times a day as needed for cough - Ordered Albuterol respiclick inhaler PRN use for coughing bronchospasm worse at night - Start Atrovent nasal spray decongestant 2 sprays in each nostril up to 4 times daily for 7 days  - Deferred X-ray repeat at this time - Note written for work  Return criteria given if significant worsening, consider post-influenza complications, otherwise follow-up if needed    Meds ordered this encounter  Medications  . benzonatate (TESSALON) 100 MG capsule    Sig: Take 1 capsule (100 mg total) by mouth 3 (three) times daily as needed for cough.    Dispense:  30 capsule    Refill:  0  . ipratropium (ATROVENT) 0.06 % nasal spray    Sig: Place 2 sprays into both nostrils 4 (four) times daily. For up to 5-7 days then stop.    Dispense:  15 mL    Refill:  0  . Albuterol Sulfate (PROAIR RESPICLICK) 937 (90 Base) MCG/ACT AEPB    Sig: Inhale 2 puffs into the lungs every 6 (six) hours as needed.    Dispense:  1 each    Refill:  0    Follow  up plan: Return in about 2 weeks (around 01/02/2019), or if symptoms worsen or fail to improve, for as needed post flu / bronchitis.   Nobie Putnam, [facility] 12/19/2018, 9:35 AM

## 2018-12-19 NOTE — Patient Instructions (Addendum)
Thank you for coming to the office today.  You have been treated for Flu already and also for Bronchitis / Walking Pneumonia with Z-pak by urgent care  X-ray did not confirm pneumonia  I do not hear a focal sign of pneumonia today in lungs  Finish Flu medicine and Z-pak  - For symptom control (these are optional OTC medicines)      - Take Ibuprofen / Advil 400-600mg  every 6-8 hours as needed for fever / muscle aches, and may also take Tylenol 500-1000mg  per dose every 6-8 hours or 3 times a day, can alternate dosing     - May try OTC Mucinex (regular) up to 7-10 days then stop  If prescribed for you      - Start Tessalon perls one every 8 hours or 3 times a day as needed for cough      - Start Atrovent nasal spray decongestant 2 sprays in each nostril up to 4 times daily for 7 days  May use rescue inhaler Albuterol as needed for coughing spell or wheezing  - Wash hands and cover cough very well to avoid spread of infection - Improve hydration with plenty of clear fluids   If significant worsening with poor fluid intake, worsening fever, difficulty breathing due to coughing, worsening body aches, weakness, or other more concerning symptoms difficulty breathing you can seek treatment at Emergency Department. Also if improved flu symptoms and then worsening days to week later with concerns for bronchitis, productive cough fever chills again we may need to check for possible pneumonia that can occur after the flu   Please schedule a Follow-up Appointment to: Return in about 2 weeks (around 01/02/2019), or if symptoms worsen or fail to improve, for as needed post flu / bronchitis.  If you have any other questions or concerns, please feel free to call the office or send a message through [place]. You may also schedule an earlier appointment if necessary.  Additionally, you may be receiving a survey about your experience at our office within a few days to 1 week by e-mail or mail. We value your  feedback.  Nobie Putnam, DO [place]

## 2019-01-10 ENCOUNTER — Other Ambulatory Visit: Payer: Self-pay | Admitting: Family Medicine

## 2019-01-10 DIAGNOSIS — G933 Postviral fatigue syndrome: Secondary | ICD-10-CM

## 2019-01-10 DIAGNOSIS — G9331 Postviral fatigue syndrome: Secondary | ICD-10-CM

## 2019-01-24 ENCOUNTER — Other Ambulatory Visit: Payer: Self-pay | Admitting: Family Medicine

## 2019-01-24 DIAGNOSIS — G933 Postviral fatigue syndrome: Secondary | ICD-10-CM

## 2019-01-24 DIAGNOSIS — J3089 Other allergic rhinitis: Secondary | ICD-10-CM

## 2019-01-24 DIAGNOSIS — G9331 Postviral fatigue syndrome: Secondary | ICD-10-CM

## 2019-01-27 MED ORDER — IPRATROPIUM BROMIDE 0.06 % NA SOLN: 2.0000 | Freq: Four times a day (QID) | NASAL | 1 refills | Status: DC

## 2019-01-27 NOTE — Addendum Note (Signed)
Addended by: Olin Hauser on: 01/27/2019 10:52 AM   Modules accepted: Orders

## 2019-05-30 DIAGNOSIS — N3000 Acute cystitis without hematuria: Secondary | ICD-10-CM | POA: Diagnosis not present

## 2019-05-30 DIAGNOSIS — R350 Frequency of micturition: Secondary | ICD-10-CM | POA: Diagnosis not present

## 2019-07-17 DIAGNOSIS — M545 Low back pain: Secondary | ICD-10-CM | POA: Diagnosis not present

## 2019-07-17 DIAGNOSIS — M5442 Lumbago with sciatica, left side: Secondary | ICD-10-CM | POA: Diagnosis not present

## 2019-07-17 DIAGNOSIS — G8929 Other chronic pain: Secondary | ICD-10-CM | POA: Diagnosis not present

## 2019-07-17 DIAGNOSIS — M5136 Other intervertebral disc degeneration, lumbar region: Secondary | ICD-10-CM | POA: Diagnosis not present

## 2019-07-17 DIAGNOSIS — M48062 Spinal stenosis, lumbar region with neurogenic claudication: Secondary | ICD-10-CM | POA: Diagnosis not present

## 2019-07-24 ENCOUNTER — Other Ambulatory Visit: Payer: Self-pay | Admitting: Family Medicine

## 2019-07-24 DIAGNOSIS — R609 Edema, unspecified: Secondary | ICD-10-CM

## 2019-07-28 DIAGNOSIS — M5410 Radiculopathy, site unspecified: Secondary | ICD-10-CM | POA: Diagnosis not present

## 2019-07-28 DIAGNOSIS — M5137 Other intervertebral disc degeneration, lumbosacral region: Secondary | ICD-10-CM | POA: Diagnosis not present

## 2019-08-06 ENCOUNTER — Encounter: Payer: Self-pay | Admitting: Obstetrics and Gynecology

## 2019-08-06 ENCOUNTER — Ambulatory Visit (INDEPENDENT_AMBULATORY_CARE_PROVIDER_SITE_OTHER): Payer: BC Managed Care – PPO | Admitting: Family Medicine

## 2019-08-06 ENCOUNTER — Ambulatory Visit (INDEPENDENT_AMBULATORY_CARE_PROVIDER_SITE_OTHER): Payer: BC Managed Care – PPO | Admitting: Obstetrics and Gynecology

## 2019-08-06 ENCOUNTER — Other Ambulatory Visit: Payer: Self-pay

## 2019-08-06 ENCOUNTER — Other Ambulatory Visit: Payer: Self-pay | Admitting: Family Medicine

## 2019-08-06 ENCOUNTER — Encounter: Payer: Self-pay | Admitting: Family Medicine

## 2019-08-06 VITALS — BP 122/69 | HR 89 | Ht 66.0 in | Wt 241.6 lb

## 2019-08-06 VITALS — BP 117/76 | HR 73 | Temp 98.6°F | Resp 16 | Ht 66.0 in | Wt 241.0 lb

## 2019-08-06 DIAGNOSIS — Z0000 Encounter for general adult medical examination without abnormal findings: Secondary | ICD-10-CM

## 2019-08-06 DIAGNOSIS — K581 Irritable bowel syndrome with constipation: Secondary | ICD-10-CM

## 2019-08-06 DIAGNOSIS — Z1211 Encounter for screening for malignant neoplasm of colon: Secondary | ICD-10-CM

## 2019-08-06 DIAGNOSIS — R7309 Other abnormal glucose: Secondary | ICD-10-CM

## 2019-08-06 DIAGNOSIS — Z1231 Encounter for screening mammogram for malignant neoplasm of breast: Secondary | ICD-10-CM | POA: Diagnosis not present

## 2019-08-06 DIAGNOSIS — E7800 Pure hypercholesterolemia, unspecified: Secondary | ICD-10-CM | POA: Diagnosis not present

## 2019-08-06 DIAGNOSIS — D75839 Thrombocytosis, unspecified: Secondary | ICD-10-CM

## 2019-08-06 DIAGNOSIS — M48061 Spinal stenosis, lumbar region without neurogenic claudication: Secondary | ICD-10-CM | POA: Insufficient documentation

## 2019-08-06 DIAGNOSIS — D473 Essential (hemorrhagic) thrombocythemia: Secondary | ICD-10-CM

## 2019-08-06 DIAGNOSIS — E538 Deficiency of other specified B group vitamins: Secondary | ICD-10-CM

## 2019-08-06 DIAGNOSIS — E669 Obesity, unspecified: Secondary | ICD-10-CM

## 2019-08-06 NOTE — Progress Notes (Signed)
HPI:      [name] is a 58 y.o. [id number] who LMP was Patient's last menstrual period was 11/24/2017 (exact date).  Subjective:   She presents today for her annual examination.  She has no complaints.  She has stopped having menstrual periods.  She has been talking to her PCP about losing weight, her elevated cholesterol and hemoglobin A1c.  She had all of these labs redrawn today as part of her annual examination.     Hx: The following portions of the patient's history were reviewed and updated as appropriate:             She  has a past medical history of Irritable bowel syndrome (2019). She does not have any pertinent problems on file. She  has a past surgical history that includes Cesarean section (1991 and 1997); Gallbladder surgery (1993); and Hysteroscopy (2018). Her family history includes Cancer in her father; Heart disease in her mother; Hyperlipidemia in her father and mother. She  reports that she quit smoking about 2 years ago. Her smoking use included cigarettes. She has a 4.50 pack-year smoking history. She has quit using smokeless tobacco. She reports previous alcohol use. She reports that she does not use drugs. She has a current medication list which includes the following prescription(s): albuterol sulfate, hydrochlorothiazide, ipratropium, meloxicam, multivitamin, polyethylene glycol, psyllium, triamcinolone cream, and valacyclovir. She has No Known Allergies.       Review of Systems:  Review of Systems  Constitutional: Denied constitutional symptoms, night sweats, recent illness, fatigue, fever, insomnia and weight loss.  Eyes: Denied eye symptoms, eye pain, photophobia, vision change and visual disturbance.  Ears/Nose/Throat/Neck: Denied ear, nose, throat or neck symptoms, hearing loss, nasal discharge, sinus congestion and sore throat.  Cardiovascular: Denied cardiovascular symptoms, arrhythmia, chest pain/pressure, edema, exercise intolerance, orthopnea and  palpitations.  Respiratory: Denied pulmonary symptoms, asthma, pleuritic pain, productive sputum, cough, dyspnea and wheezing.  Gastrointestinal: Denied, gastro-esophageal reflux, melena, nausea and vomiting.  Genitourinary: Denied genitourinary symptoms including symptomatic vaginal discharge, pelvic relaxation issues, and urinary complaints.  Musculoskeletal: Denied musculoskeletal symptoms, stiffness, swelling, muscle weakness and myalgia.  Dermatologic: Denied dermatology symptoms, rash and scar.  Neurologic: Denied neurology symptoms, dizziness, headache, neck pain and syncope.  Psychiatric: Denied psychiatric symptoms, anxiety and depression.  Endocrine: Denied endocrine symptoms including hot flashes and night sweats.   Meds:   Current Outpatient Medications on File Prior to Visit  Medication Sig Dispense Refill  . Albuterol Sulfate (PROAIR RESPICLICK) 123XX123 (90 Base) MCG/ACT AEPB Inhale 2 puffs into the lungs every 6 (six) hours as needed. 1 each 0  . hydrochlorothiazide (HYDRODIURIL) 25 MG tablet TAKE 1 TABLET BY MOUTH EVERY DAY 90 tablet 1  . ipratropium (ATROVENT) 0.06 % nasal spray Place 2 sprays into both nostrils 4 (four) times daily. For up to 5-7 days then stop. 15 mL 1  . meloxicam (MOBIC) 15 MG tablet TAKE 1 TABLET (15 MG TOTAL) BY MOUTH ONCE DAILY FOR 30 DAYS    . Multiple Vitamin (MULTIVITAMIN) LIQD Take 5 mLs by mouth daily.    . polyethylene glycol (MIRALAX / GLYCOLAX) packet Take 17 g by mouth daily.    . psyllium (METAMUCIL) 58.6 % packet Take 1 packet by mouth daily.    Marland Kitchen triamcinolone cream (KENALOG) 0.1 % Apply 1 application topically 2 (two) times daily as needed. For 1-2 weeks. Do not use on face. 30 g 0  . valACYclovir (VALTREX) 500 MG tablet Take 500 mg by mouth 3 (three)  times daily as needed. PRN herpes flare     No current facility-administered medications on file prior to visit.     Objective:     Vitals:   08/06/19 1103  BP: 122/69  Pulse: 89               Physical examination General NAD, Conversant  HEENT Atraumatic; Op clear with mmm.  Normo-cephalic. Pupils reactive. Anicteric sclerae  Thyroid/Neck Smooth without nodularity or enlargement. Normal ROM.  Neck Supple.  Skin No rashes, lesions or ulceration. Normal palpated skin turgor. No nodularity.  Breasts: No masses or discharge.  Symmetric.  No axillary adenopathy.  Lungs: Clear to auscultation.No rales or wheezes. Normal Respiratory effort, no retractions.  Heart: NSR.  No murmurs or rubs appreciated. No periferal edema  Abdomen: Soft.  Non-tender.  No masses.  No HSM. No hernia  Extremities: Moves all appropriately.  Normal ROM for age. No lymphadenopathy.  Neuro: Oriented to PPT.  Normal mood. Normal affect.     Pelvic:   Vulva: Normal appearance.  No lesions.  Vagina: No lesions or abnormalities noted.  Support: Normal pelvic support.  Urethra No masses tenderness or scarring.  Meatus Normal size without lesions or prolapse.  Cervix: Normal appearance.  No lesions.  Anus: Normal exam.  No lesions.  Perineum: Normal exam.  No lesions.        Bimanual   Uterus: Normal size.  Non-tender.  Mobile.  AV.  Adnexae: No masses.  Non-tender to palpation.  Cul-de-sac: Negative for abnormality.    Examination limited by patient body habitus  Assessment:    [id number] Patient Active Problem List   Diagnosis Date Noted  . Thrombocytosis ([place]) 08/28/2018  . Elevated hemoglobin A1c 08/25/2018  . Hypercholesteremia 07/25/2018  . Seborrheic keratoses 07/24/2018  . Obesity (BMI 35.0-39.9 without comorbidity) 07/24/2018  . Fluid retention 07/24/2018  . Irritable bowel syndrome 10/16/2017     1. Encounter for annual physical exam   2. Encounter for screening mammogram for malignant neoplasm of breast   3. Obesity (BMI 35.0-39.9 without comorbidity)        Plan:            1.  Basic Screening Recommendations The basic screening recommendations for asymptomatic women were  discussed with the patient during her visit.  The age-appropriate recommendations were discussed with her and the rational for the tests reviewed.  When I am informed by the patient that another primary care physician has previously obtained the age-appropriate tests and they are up-to-date, only outstanding tests are ordered and referrals given as necessary.  Abnormal results of tests will be discussed with her when all of her results are completed.  Routine preventative health maintenance measures emphasized: Exercise/Diet/Weight control, Tobacco Warnings, Alcohol/Substance use risks and Stress Management Mammogram ordered Orders Orders Placed This Encounter  Procedures  . MM 3D SCREEN BREAST BILATERAL    No orders of the defined types were placed in this encounter.       F/U  Return in about 1 year (around 08/05/2020) for Annual Physical.  Finis Bud, M.D. 08/06/2019 11:28 AM

## 2019-08-06 NOTE — Patient Instructions (Addendum)
Thank you for coming to the office today.  In future - we can consider Urology consultation - if recurrent cloudy or inflammation within bladder causing symptoms, may need Urodynamic / Bladder emptying testing.  Also in future - once you are done with prednisone and steroids / anti inflam meds from ortho we can do a blood panel screening here for inflammatory markers - ESR (Sed Rate), CRP (C reactive protein), ANA for lupus, Rheumatoid Factor for RA  Colon Cancer Screening: - For all adults age 6+ routine colon cancer screening is highly recommended.     - Recent guidelines from [place] recommend starting age of 28 - Early detection of colon cancer is important, because often there are no warning signs or symptoms, also if found early usually it can be cured. Late stage is hard to treat.   Cologuard is an excellent alternative for screening test for Colon Cancer. It is highly sensitive for detecting DNA of colon cancer from even the earliest stages. Also, there is NO bowel prep required. - If Cologuard is NEGATIVE, then it is good for 3 years before next due - If Cologuard is POSITIVE, then it is strongly advised to get a Colonoscopy, which allows the GI doctor to locate the source of the cancer or polyp (even very early stage) and treat it by removing it. -------------------------  CPT Code [phone] call insurance to check cost coverage.   The test kit will be delivered to you house within about 1 week. Follow instructions to collect sample, you may call the company for any help or questions, 24/7 telephone support at [phone].   DUE for FASTING BLOOD WORK (no food or drink after midnight before the lab appointment, only water or coffee without cream/sugar on the morning of)  SCHEDULE "Lab Only" visit in the morning at the clinic for lab draw in 4 MONTHS   - Make sure Lab Only appointment is at about 1 week before your next appointment, so that results will be  available  For Lab Results, once available within 2-3 days of blood draw, you can can log in to MyChart online to view your results and a brief explanation. Also, we can discuss results at next follow-up visit.   Please schedule a Follow-up Appointment to: Return in about 4 months (around 12/07/2019) for 4 month follow-up lab results inflammatory / Urinary frequency.  If you have any other questions or concerns, please feel free to call the office or send a message through [place]. You may also schedule an earlier appointment if necessary.  Additionally, you may be receiving a survey about your experience at our office within a few days to 1 week by e-mail or mail. We value your feedback.  Nobie Putnam, DO [place]

## 2019-08-06 NOTE — Progress Notes (Signed)
Subjective:    Patient ID: Kara Porter, female    DOB: 01/29/1961, 58 y.o.   MRN: [id number]  Kara Porter is a 58 y.o. female presenting on 08/06/2019 for Annual Exam   HPI   Here for Annual Physical and Lab Orders.   Wellness / Pre-Diabetes / Obesity BMI >38 Due for lab now. Previous 5.7 Admits wt gain with lifestyle limitation with back pain. CBGs: Not checking sugar Meds: Never on  HYPERLIPIDEMIA: - Reports no concerns. Last lipid panel 08/2018, normal HDL and TG, slightly elevated Total Cholesterol and LDL 145, but improved from 2018 with LDL 165 - Not on cholesterol medicine  Thrombocytosis, elevated platelets Prior history of mild elevated platelets for past 2 years+, with range 400 to 430 Due for lab with CBC for PLT count. Discussed inflammation as possible cause. Admits easy bruising, more frequently, but no bleeding problem  Post menopausal  Additional topics today:  PMH - IBS (see prior notes for background), remains chronic problem. Improving diet. Former Smoker  Lumbar Spinal Stenosis - Followed by Orthopedics, [place].  - Did Prednisone taper to calm down inflammation 6 days > now on Meloxicam - 2nd dose of prednisone - says "entire body deflated", she noticed rest of her body felt betteroverall. She is improved on Meloxicam still now for 30 days, but she is concerned that she was experiencing that generalized inflammation. She was concerned in past she could have fibromyalgia or other inflammatory condition.  Additional concern: Has increased episode of cloudy urine and increased urinary frequency, often has checked urine with no confirmation of infection   Health Maintenance:  Due Flu / TDap - declines today  Prior colonoscopy, [place] - no record received, approx 6 years ago, age 67, no polyps, next due reported at age 62 - but she agrees to proceed with Cologuard if covered.  F/u with OBGYN for Mammogram as scheduled.   Depression screen  [facility] 2/9 08/06/2019 12/19/2018 08/28/2018  Decreased Interest 0 0 0  Down, Depressed, Hopeless 0 0 0  PHQ - 2 Score 0 0 0    Past Medical History:  Diagnosis Date  . Irritable bowel syndrome 2019   Past Surgical History:  Procedure Laterality Date  . [place]  . [place]  . HYSTEROSCOPY  2018   Social History   Socioeconomic History  . Marital status: Married    Spouse name: Not on file  . Number of children: 2  . Years of education: College  . Highest education level: Bachelor's degree (e.g., BA, AB, BS)  Occupational History  . Occupation: HR Manager    Comment: [employer]  Social Needs  . Financial resource strain: Not on file  . Food insecurity    Worry: Not on file    Inability: Not on file  . Transportation needs    Medical: Not on file    Non-medical: Not on file  Tobacco Use  . Smoking status: Former Smoker    Packs/day: 0.75    Years: 6.00    Pack years: 4.50    Types: Cigarettes    Quit date: 2018    Years since quitting: 2.8  . Smokeless tobacco: Former Network engineer and Sexual Activity  . Alcohol use: Not Currently    Frequency: Never  . Drug use: Never  . Sexual activity: Not Currently    Birth control/protection: None  Lifestyle  . Physical activity    Days per week: 5 days  Minutes per session: Not on file  . Stress: Rather much  Relationships  . Social Herbalist on phone: Three times a week    Gets together: Three times a week    Attends religious service: Not on file    Active member of club or organization: Not on file    Attends meetings of clubs or organizations: Not on file    Relationship status: Married  . Intimate partner violence    Fear of current or ex partner: No    Emotionally abused: No    Physically abused: No    Forced sexual activity: No  Other Topics Concern  . Not on file  Social History Narrative  . Not on file   Family History  Problem Relation Age of Onset   . Hyperlipidemia Mother   . Heart disease Mother   . Cancer Father   . Hyperlipidemia Father   . Breast cancer Neg Hx    Current Outpatient Medications on File Prior to Visit  Medication Sig  . Albuterol Sulfate (PROAIR RESPICLICK) 123XX123 (90 Base) MCG/ACT AEPB Inhale 2 puffs into the lungs every 6 (six) hours as needed.  . hydrochlorothiazide (HYDRODIURIL) 25 MG tablet TAKE 1 TABLET BY MOUTH EVERY DAY  . ipratropium (ATROVENT) 0.06 % nasal spray Place 2 sprays into both nostrils 4 (four) times daily. For up to 5-7 days then stop.  . Multiple Vitamin (MULTIVITAMIN) LIQD Take 5 mLs by mouth daily.  . polyethylene glycol (MIRALAX / GLYCOLAX) packet Take 17 g by mouth daily.  . psyllium (METAMUCIL) 58.6 % packet Take 1 packet by mouth daily.  Marland Kitchen triamcinolone cream (KENALOG) 0.1 % Apply 1 application topically 2 (two) times daily as needed. For 1-2 weeks. Do not use on face.  . valACYclovir (VALTREX) 500 MG tablet Take 500 mg by mouth 3 (three) times daily as needed. PRN herpes flare  . meloxicam (MOBIC) 15 MG tablet TAKE 1 TABLET (15 MG TOTAL) BY MOUTH ONCE DAILY FOR 30 DAYS   No current facility-administered medications on file prior to visit.     Review of Systems  Constitutional: Negative for activity change, appetite change, chills, diaphoresis, fatigue and fever.  HENT: Negative for congestion and hearing loss.   Eyes: Negative for visual disturbance.  Respiratory: Negative for apnea, cough, chest tightness, shortness of breath and wheezing.   Cardiovascular: Negative for chest pain, palpitations and leg swelling.  Gastrointestinal: Negative for abdominal pain, anal bleeding, blood in stool, constipation, diarrhea, nausea and vomiting.  Endocrine: Negative for cold intolerance.  Genitourinary: Negative for difficulty urinating, dysuria, frequency and hematuria.  Musculoskeletal: Positive for arthralgias and back pain. Negative for neck pain.  Skin: Negative for rash.   Allergic/Immunologic: Negative for environmental allergies.  Neurological: Negative for dizziness, weakness, light-headedness, numbness and headaches.  Hematological: Negative for adenopathy.  Psychiatric/Behavioral: Negative for behavioral problems, dysphoric mood and sleep disturbance. The patient is not nervous/anxious.    Per HPI unless specifically indicated above      Objective:    BP 117/76   Pulse 73   Temp 98.6 F (37 C) (Oral)   Resp 16   Ht 5\' 6"  (1.676 m)   Wt 241 lb (109.3 kg)   LMP 11/24/2017 (Exact Date)   BMI 38.90 kg/m   Wt Readings from Last 3 Encounters:  08/06/19 241 lb 9.6 oz (109.6 kg)  08/06/19 241 lb (109.3 kg)  12/19/18 223 lb (101.2 kg)    Physical Exam Vitals signs and  nursing note reviewed.  Constitutional:      General: She is not in acute distress.    Appearance: She is well-developed. She is not diaphoretic.     Comments: Well-appearing, comfortable, cooperative, obesity  HENT:     Head: Normocephalic and atraumatic.  Eyes:     General:        Right eye: No discharge.        Left eye: No discharge.     Conjunctiva/sclera: Conjunctivae normal.     Pupils: Pupils are equal, round, and reactive to light.  Neck:     Musculoskeletal: Normal range of motion and neck supple.     Thyroid: No thyromegaly.  Cardiovascular:     Rate and Rhythm: Normal rate and regular rhythm.     Heart sounds: Normal heart sounds. No murmur.  Pulmonary:     Effort: Pulmonary effort is normal. No respiratory distress.     Breath sounds: Normal breath sounds. No wheezing or rales.  Abdominal:     General: Bowel sounds are normal. There is no distension.     Palpations: Abdomen is soft. There is no mass.     Tenderness: There is no abdominal tenderness.  Musculoskeletal: Normal range of motion.        General: No tenderness.     Comments: Upper / Lower Extremities: - Normal muscle tone, strength bilateral upper extremities 5/5, lower extremities 5/5   Lymphadenopathy:     Cervical: No cervical adenopathy.  Skin:    General: Skin is warm and dry.     Findings: No erythema or rash.  Neurological:     Mental Status: She is alert and oriented to person, place, and time.     Comments: Distal sensation intact to light touch all extremities  Psychiatric:        Behavior: Behavior normal.     Comments: Well groomed, good eye contact, normal speech and thoughts        Results for orders placed or performed in visit on 08/21/18  T4, free  Result Value Ref Range   Free T4 0.9 0.8 - 1.8 ng/dL  TSH  Result Value Ref Range   TSH 1.44 mIU/L  Lipid panel  Result Value Ref Range   Cholesterol 221 (H) <200 mg/dL   HDL 49 (L) >50 mg/dL   Triglycerides 142 <150 mg/dL   LDL Cholesterol (Calc) 145 (H) mg/dL (calc)   Total CHOL/HDL Ratio 4.5 <5.0 (calc)   Non-HDL Cholesterol (Calc) 172 (H) <130 mg/dL (calc)  COMPLETE METABOLIC PANEL WITH GFR  Result Value Ref Range   Glucose, Bld 100 (H) 65 - 99 mg/dL   BUN 12 7 - 25 mg/dL   Creat 0.65 0.50 - 1.05 mg/dL   GFR, Est Non African American 100 > OR = 60 mL/min/1.77m2   GFR, Est African American 116 > OR = 60 mL/min/1.39m2   BUN/Creatinine Ratio NOT APPLICABLE 6 - 22 (calc)   Sodium 138 135 - 146 mmol/L   Potassium 3.9 3.5 - 5.3 mmol/L   Chloride 103 98 - 110 mmol/L   CO2 28 20 - 32 mmol/L   Calcium 9.4 8.6 - 10.4 mg/dL   Total Protein 7.2 6.1 - 8.1 g/dL   Albumin 4.4 3.6 - 5.1 g/dL   Globulin 2.8 1.9 - 3.7 g/dL (calc)   AG Ratio 1.6 1.0 - 2.5 (calc)   Total Bilirubin 0.4 0.2 - 1.2 mg/dL   Alkaline phosphatase (APISO) 100 33 - 130 U/L  AST 17 10 - 35 U/L   ALT 18 6 - 29 U/L  CBC with Differential/Platelet  Result Value Ref Range   WBC 8.5 3.8 - 10.8 Thousand/uL   RBC 4.40 3.80 - 5.10 Million/uL   Hemoglobin 13.2 11.7 - 15.5 g/dL   HCT 40.5 35.0 - 45.0 %   MCV 92.0 80.0 - 100.0 fL   MCH 30.0 27.0 - 33.0 pg   MCHC 32.6 32.0 - 36.0 g/dL   RDW 12.7 11.0 - 15.0 %   Platelets 429 (H)  140 - 400 Thousand/uL   MPV 9.9 7.5 - 12.5 fL   Neutro Abs 5,304 1,500 - 7,800 cells/uL   Lymphs Abs 2,601 850 - 3,900 cells/uL   WBC mixed population 510 200 - 950 cells/uL   Eosinophils Absolute 51 15 - 500 cells/uL   Basophils Absolute 34 0 - 200 cells/uL   Neutrophils Relative % 62.4 %   Total Lymphocyte 30.6 %   Monocytes Relative 6.0 %   Eosinophils Relative 0.6 %   Basophils Relative 0.4 %  Hemoglobin A1c  Result Value Ref Range   Hgb A1c MFr Bld 5.7 (H) <5.7 % of total Hgb   Mean Plasma Glucose 117 (calc)   eAG (mmol/L) 6.5 (calc)      Assessment & Plan:   Problem List Items Addressed This Visit    Thrombocytosis (HCC)   Relevant Orders   CBC with Differential/Platelet   Spinal stenosis of lumbar region without neurogenic claudication   Obesity (BMI 35.0-39.9 without comorbidity)   Relevant Orders   COMPLETE METABOLIC PANEL WITH GFR   TSH   Irritable bowel syndrome   Hypercholesteremia   Relevant Orders   COMPLETE METABOLIC PANEL WITH GFR   Lipid panel   Elevated hemoglobin A1c   Relevant Orders   Hemoglobin A1c    Other Visit Diagnoses    Annual physical exam    -  Primary   Relevant Orders   Hemoglobin A1c   CBC with Differential/Platelet   COMPLETE METABOLIC PANEL WITH GFR   Lipid panel   TSH   Vitamin B12 nutritional deficiency       Relevant Orders   Vitamin B12   Screening for colon cancer       Relevant Orders   Cologuard      Updated Health Maintenance information - Proceed w/ OBGYN f/u for prevention and Mammogram - Declined Flu Vaccine.  Due for routine colon cancer screening. Last colonoscopy about 6-7 years ago in [place], never received record, will request once more. She agrees to check with cologuard screening now instead of waiting full 10 years. - Discussion today about recommendations for either Colonoscopy or Cologuard screening, benefits and risks of screening, interested in Cologuard, understands that if positive then recommendation  is for diagnostic colonoscopy to follow-up. - Ordered Cologuard today  - Patient advised to contact insurance first to learn cost as well  Ordered fasting labs to be drawn today, will review once available. Encouraged improvement to lifestyle with diet and exercise - Goal of weight loss  #Platelets - will check CBC to f/u trend, may be from chronic inflammation  #Lumbar spinal stenosis Proceed with [facility] Orthopedic management, steroid/NSAID and further treatment Anticipate defer further work-up with inflammatory markers for complaints of generalized inflammation concerns until this situation is better under control and off those meds. - Future consider fibro vs inflammatory condition  #Urinary dysfunction Possible OAB or Cystitis Anticipate will need UA and culture at that time to  discuss further she is interested in referral to Urology for more PVR Urodynamic evaluation  No orders of the defined types were placed in this encounter.    Follow up plan: Return in about 4 months (around 12/07/2019) for 4 month follow-up lab results inflammatory / Urinary frequency.   Labs to be ordered once current results reviewed.  Nobie Putnam, [facility] 08/06/2019, 9:09 AM

## 2019-08-06 NOTE — Progress Notes (Signed)
Patient comes in today for annual exam. She is due for mammogram. Labs through PCP. Pap 2019 that was normal.

## 2019-08-07 LAB — CBC WITH DIFFERENTIAL/PLATELET
Absolute Monocytes: 323 cells/uL (ref 200–950)
Basophils Absolute: 30 cells/uL (ref 0–200)
Basophils Relative: 0.4 %
Eosinophils Absolute: 68 cells/uL (ref 15–500)
Eosinophils Relative: 0.9 %
HCT: 41.8 % (ref 35.0–45.0)
Hemoglobin: 13.7 g/dL (ref 11.7–15.5)
Lymphs Abs: 2280 cells/uL (ref 850–3900)
MCH: 30.2 pg (ref 27.0–33.0)
MCHC: 32.8 g/dL (ref 32.0–36.0)
MCV: 92.3 fL (ref 80.0–100.0)
MPV: 9.7 fL (ref 7.5–12.5)
Monocytes Relative: 4.3 %
Neutro Abs: 4800 cells/uL (ref 1500–7800)
Neutrophils Relative %: 64 %
Platelets: 327 10*3/uL (ref 140–400)
RBC: 4.53 10*6/uL (ref 3.80–5.10)
RDW: 12.6 % (ref 11.0–15.0)
Total Lymphocyte: 30.4 %
WBC: 7.5 10*3/uL (ref 3.8–10.8)

## 2019-08-07 LAB — COMPLETE METABOLIC PANEL WITH GFR
AG Ratio: 1.4 (calc) (ref 1.0–2.5)
ALT: 20 U/L (ref 6–29)
AST: 18 U/L (ref 10–35)
Albumin: 4.1 g/dL (ref 3.6–5.1)
Alkaline phosphatase (APISO): 77 U/L (ref 37–153)
BUN: 12 mg/dL (ref 7–25)
CO2: 25 mmol/L (ref 20–32)
Calcium: 9.2 mg/dL (ref 8.6–10.4)
Chloride: 107 mmol/L (ref 98–110)
Creat: 0.52 mg/dL (ref 0.50–1.05)
GFR, Est African American: 124 mL/min/{1.73_m2} (ref >=60)
GFR, Est Non African American: 107 mL/min/{1.73_m2} (ref >=60)
Globulin: 2.9 g/dL (calc) (ref 1.9–3.7)
Glucose, Bld: 97 mg/dL (ref 65–99)
Potassium: 4.3 mmol/L (ref 3.5–5.3)
Sodium: 141 mmol/L (ref 135–146)
Total Bilirubin: 0.4 mg/dL (ref 0.2–1.2)
Total Protein: 7 g/dL (ref 6.1–8.1)

## 2019-08-07 LAB — LIPID PANEL
Cholesterol: 236 mg/dL — ABNORMAL HIGH (ref <200)
HDL: 52 mg/dL (ref >=50)
LDL Cholesterol (Calc): 152 mg/dL (calc) — ABNORMAL HIGH
Non-HDL Cholesterol (Calc): 184 mg/dL (calc) — ABNORMAL HIGH (ref <130)
Total CHOL/HDL Ratio: 4.5 (calc) (ref <5.0)
Triglycerides: 183 mg/dL — ABNORMAL HIGH (ref <150)

## 2019-08-07 LAB — HEMOGLOBIN A1C
Hgb A1c MFr Bld: 5.8 % of total Hgb — ABNORMAL HIGH (ref <5.7)
Mean Plasma Glucose: 120 (calc)
eAG (mmol/L): 6.6 (calc)

## 2019-08-07 LAB — VITAMIN B12: Vitamin B-12: 583 pg/mL (ref 200–1100)

## 2019-08-07 LAB — TSH: TSH: 1.01 mIU/L (ref 0.40–4.50)

## 2019-08-08 ENCOUNTER — Telehealth: Payer: Self-pay

## 2019-08-08 MED ORDER — VALACYCLOVIR HCL 500 MG PO TABS: 500.0000 mg | ORAL_TABLET | Freq: Three times a day (TID) | ORAL | 2 refills | Status: DC | PRN

## 2019-08-08 NOTE — Telephone Encounter (Signed)
Pt requesting [place].  CPE 08/06/19. New prescription request. Pt uses [facility] [address] [place].  Pt ph [phone].

## 2019-08-08 NOTE — Telephone Encounter (Signed)
I have sent prescription to the pharmacy. Left message for patient that it has been sent in.

## 2019-08-20 DIAGNOSIS — M5416 Radiculopathy, lumbar region: Secondary | ICD-10-CM | POA: Diagnosis not present

## 2019-08-20 DIAGNOSIS — M5136 Other intervertebral disc degeneration, lumbar region: Secondary | ICD-10-CM | POA: Diagnosis not present

## 2019-08-20 DIAGNOSIS — M48062 Spinal stenosis, lumbar region with neurogenic claudication: Secondary | ICD-10-CM | POA: Diagnosis not present

## 2019-09-08 ENCOUNTER — Other Ambulatory Visit: Payer: Self-pay | Admitting: Family Medicine

## 2019-09-08 DIAGNOSIS — G8929 Other chronic pain: Secondary | ICD-10-CM

## 2019-09-08 DIAGNOSIS — K581 Irritable bowel syndrome with constipation: Secondary | ICD-10-CM

## 2019-09-08 DIAGNOSIS — R609 Edema, unspecified: Secondary | ICD-10-CM

## 2019-09-08 DIAGNOSIS — D75839 Thrombocytosis, unspecified: Secondary | ICD-10-CM

## 2019-09-08 DIAGNOSIS — M48061 Spinal stenosis, lumbar region without neurogenic claudication: Secondary | ICD-10-CM

## 2019-09-08 DIAGNOSIS — E669 Obesity, unspecified: Secondary | ICD-10-CM

## 2019-09-08 DIAGNOSIS — M2550 Pain in unspecified joint: Secondary | ICD-10-CM

## 2019-09-08 DIAGNOSIS — R7309 Other abnormal glucose: Secondary | ICD-10-CM

## 2019-09-08 DIAGNOSIS — D473 Essential (hemorrhagic) thrombocythemia: Secondary | ICD-10-CM

## 2019-09-23 ENCOUNTER — Ambulatory Visit: Payer: Managed Care, Other (non HMO)

## 2019-09-26 DIAGNOSIS — M5416 Radiculopathy, lumbar region: Secondary | ICD-10-CM | POA: Diagnosis not present

## 2019-09-26 DIAGNOSIS — M5136 Other intervertebral disc degeneration, lumbar region: Secondary | ICD-10-CM | POA: Diagnosis not present

## 2019-09-26 DIAGNOSIS — M48062 Spinal stenosis, lumbar region with neurogenic claudication: Secondary | ICD-10-CM | POA: Diagnosis not present

## 2019-09-29 ENCOUNTER — Ambulatory Visit
Admission: RE | Admit: 2019-09-29 | Discharge: 2019-09-29 | Disposition: A | Discharge status: Home / Self Care | Payer: BC Managed Care – PPO | Source: Ambulatory Visit | Attending: Obstetrics and Gynecology | Admitting: Obstetrics and Gynecology

## 2019-09-29 ENCOUNTER — Other Ambulatory Visit: Payer: Self-pay

## 2019-09-29 DIAGNOSIS — Z1231 Encounter for screening mammogram for malignant neoplasm of breast: Secondary | ICD-10-CM | POA: Insufficient documentation

## 2019-10-24 IMAGING — MG DIGITAL SCREENING BILATERAL MAMMOGRAM WITH TOMO AND CAD
8 of 14 series · 8 of 40 positions shown · non-contrast
Comparison: Previous exam(s).

CLINICAL DATA: Screening.

EXAM:
DIGITAL SCREENING BILATERAL MAMMOGRAM WITH TOMO AND CAD

[L MLO synth-2D]
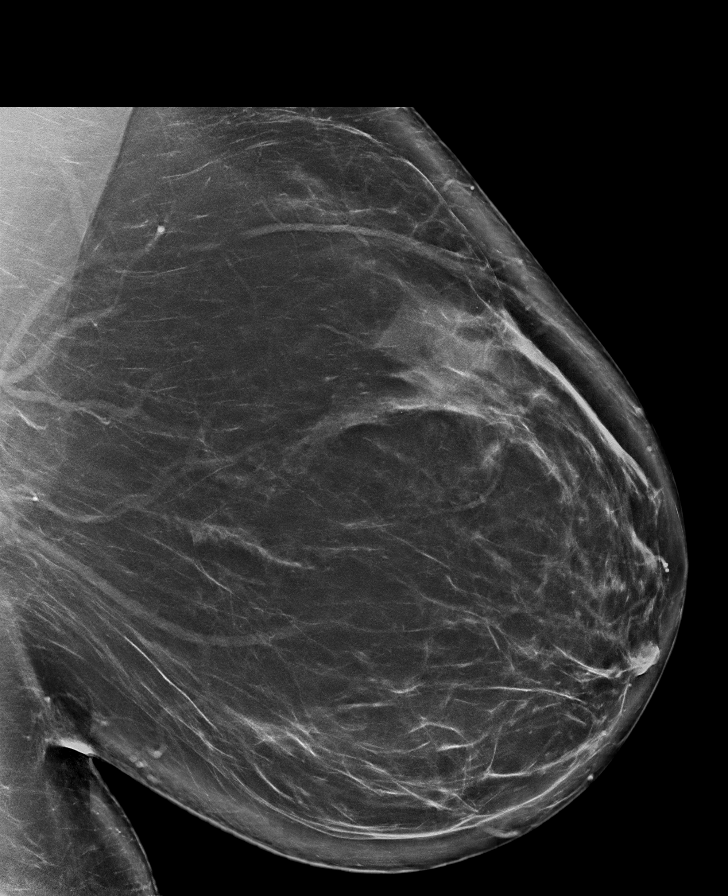

[R CC synth-2D]
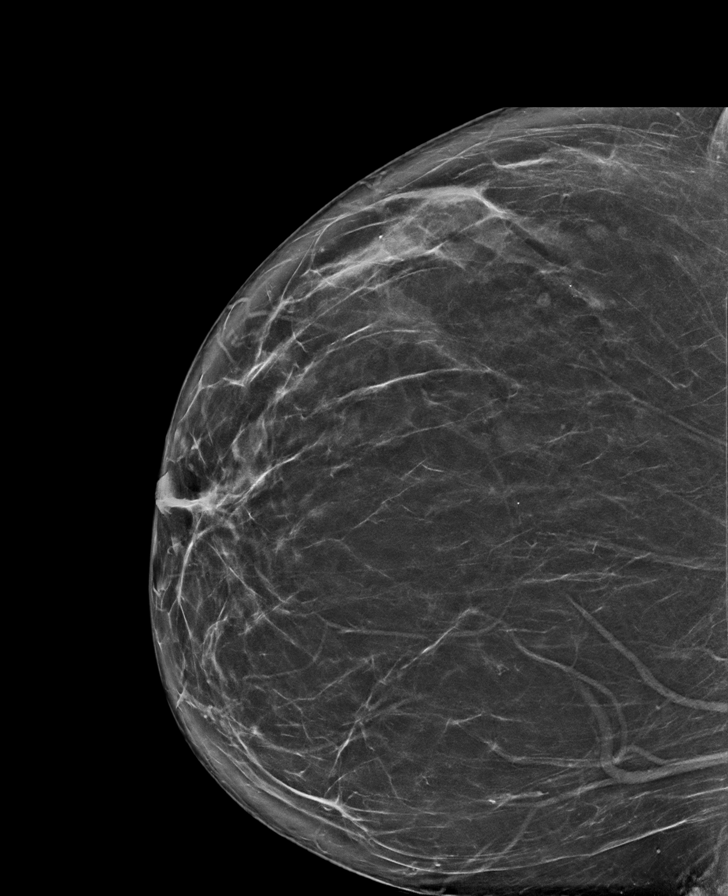

[L CV synth-2D]
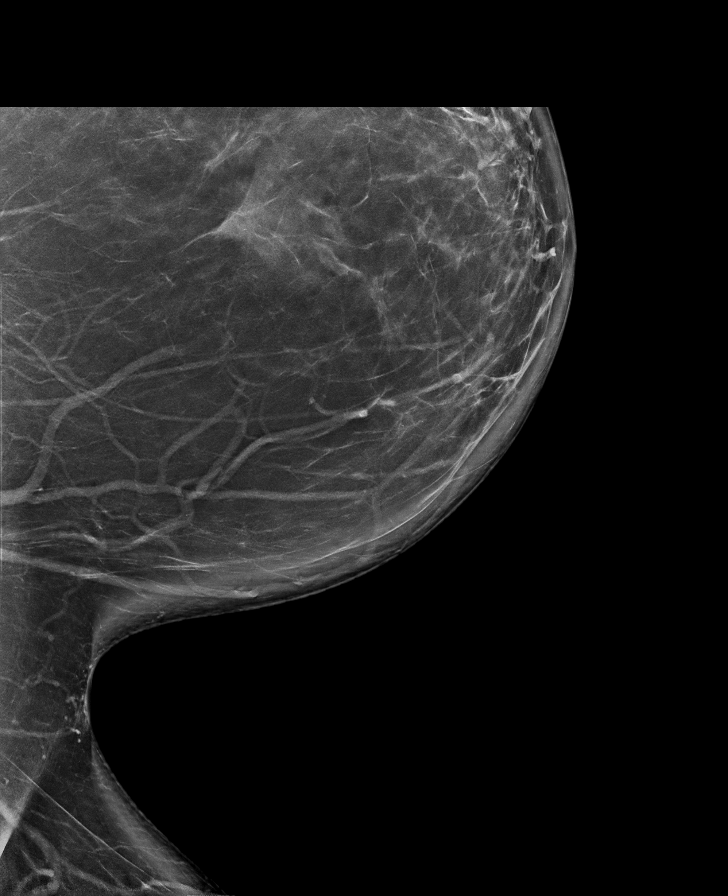

[R CV synth-2D]
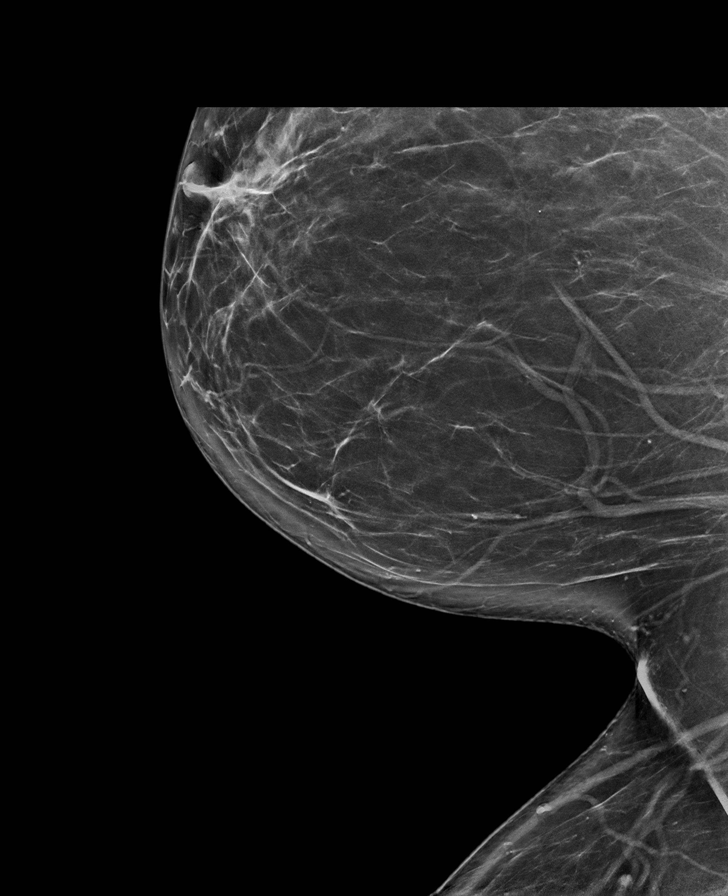

[L CC synth-2D]
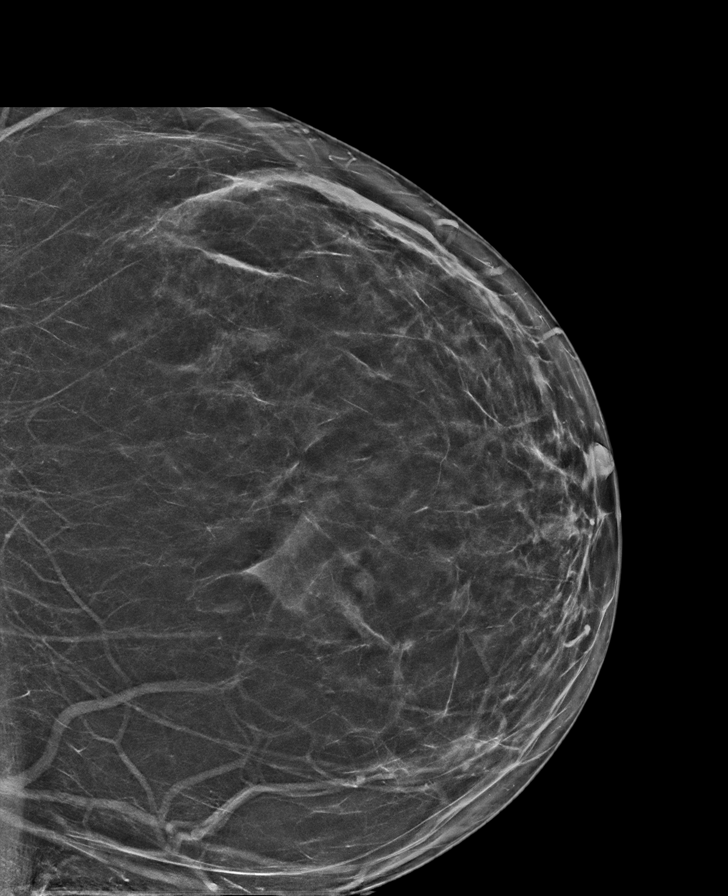

[R MLO synth-2D (1 of 2)]
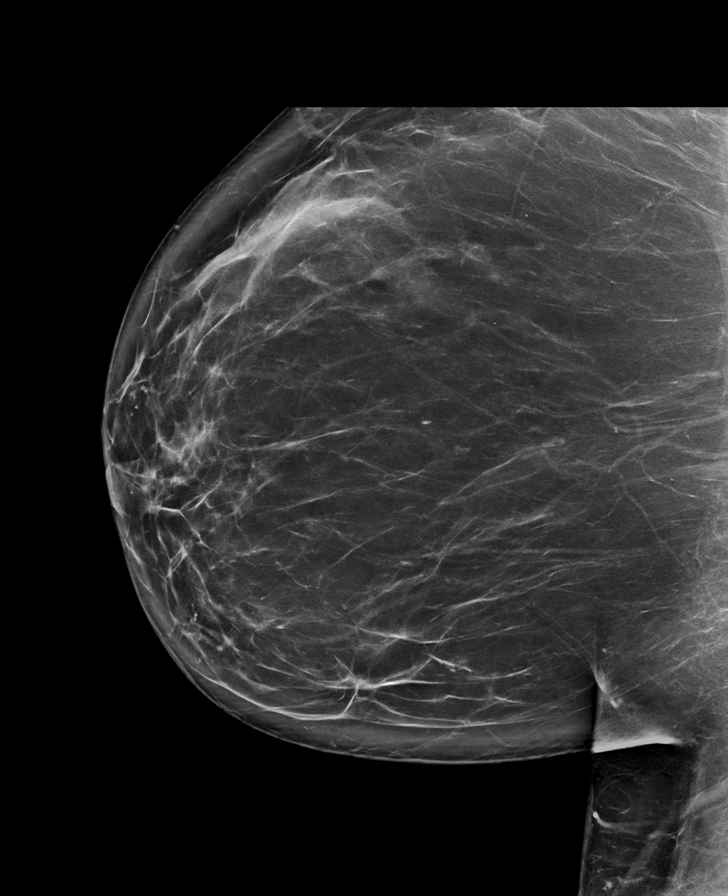

[R MLO synth-2D (2 of 2)]
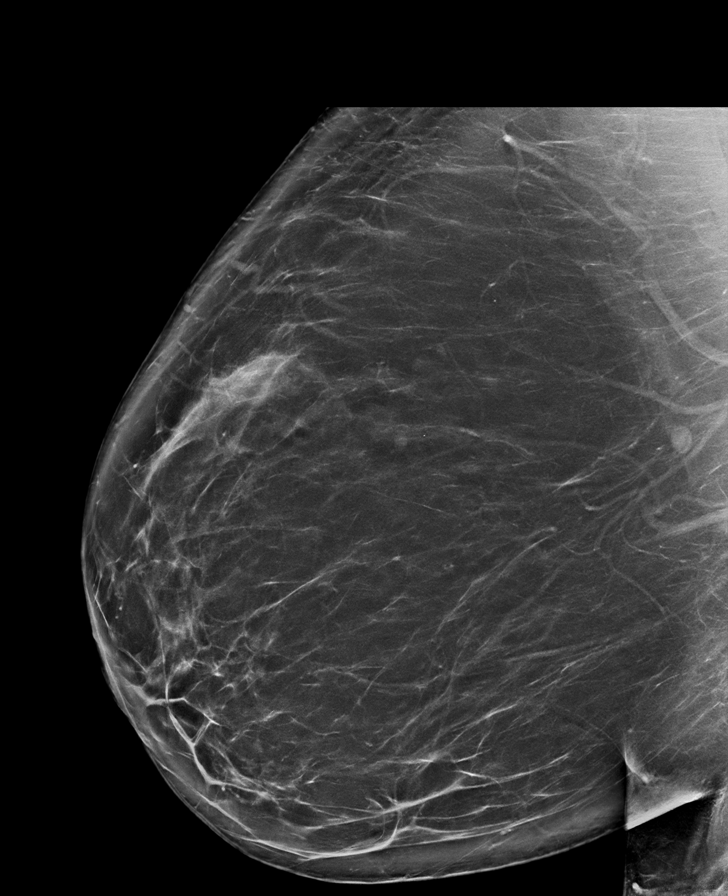

[R CC tomo · tomo slice 44/87.0]
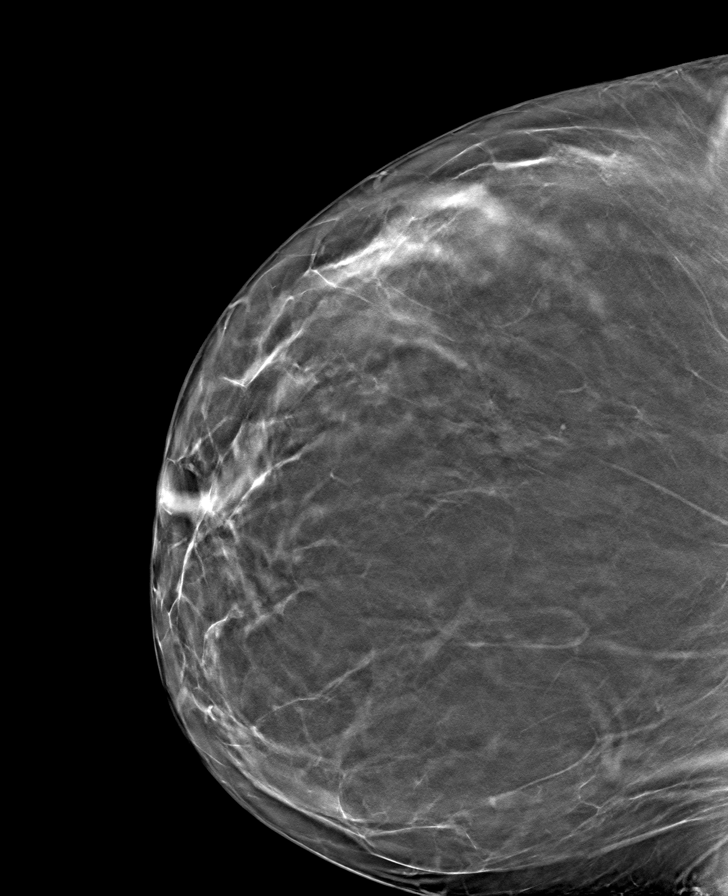

[8 of 40 positions shown; findings below may reference images not displayed]

ACR Breast Density Category b: There are scattered areas of
fibroglandular density.
FINDINGS: There are no findings suspicious for malignancy. Images were
processed with CAD.
IMPRESSION: No mammographic evidence of malignancy. A result letter of this
screening mammogram will be mailed directly to the patient.

RECOMMENDATION:
Screening mammogram in one year. (Code:[id number])

BI-RADS CATEGORY  1: Negative.

## 2019-11-17 DIAGNOSIS — Z1212 Encounter for screening for malignant neoplasm of rectum: Secondary | ICD-10-CM | POA: Diagnosis not present

## 2019-11-17 DIAGNOSIS — Z1211 Encounter for screening for malignant neoplasm of colon: Secondary | ICD-10-CM | POA: Diagnosis not present

## 2019-11-17 LAB — COLOGUARD: Cologuard: NEGATIVE

## 2019-11-20 ENCOUNTER — Encounter: Payer: Self-pay | Admitting: Family Medicine

## 2019-11-20 LAB — COLOGUARD: COLOGUARD: NEGATIVE

## 2019-12-04 ENCOUNTER — Other Ambulatory Visit: Payer: BC Managed Care – PPO

## 2019-12-08 ENCOUNTER — Other Ambulatory Visit (INDEPENDENT_AMBULATORY_CARE_PROVIDER_SITE_OTHER): Payer: BC Managed Care – PPO

## 2019-12-08 ENCOUNTER — Other Ambulatory Visit: Payer: Self-pay

## 2019-12-08 DIAGNOSIS — M48061 Spinal stenosis, lumbar region without neurogenic claudication: Secondary | ICD-10-CM | POA: Diagnosis not present

## 2019-12-08 DIAGNOSIS — G8929 Other chronic pain: Secondary | ICD-10-CM | POA: Diagnosis not present

## 2019-12-08 DIAGNOSIS — M2550 Pain in unspecified joint: Secondary | ICD-10-CM | POA: Diagnosis not present

## 2019-12-08 DIAGNOSIS — N390 Urinary tract infection, site not specified: Secondary | ICD-10-CM | POA: Diagnosis not present

## 2019-12-08 DIAGNOSIS — R7309 Other abnormal glucose: Secondary | ICD-10-CM | POA: Diagnosis not present

## 2019-12-08 LAB — POCT URINALYSIS DIPSTICK
Bilirubin, UA: NEGATIVE
Blood, UA: NEGATIVE
Glucose, UA: NEGATIVE
Ketones, UA: NEGATIVE
Leukocytes, UA: NEGATIVE
Nitrite, UA: NEGATIVE
Protein, UA: NEGATIVE
Spec Grav, UA: 1.025 (ref 1.010–1.025)
Urobilinogen, UA: 0.2 E.U./dL
pH, UA: 5 (ref 5.0–8.0)

## 2019-12-08 NOTE — Addendum Note (Signed)
Addended by: Frederich Cha D on: 12/08/2019 09:44 AM   Modules accepted: Orders

## 2019-12-09 LAB — URINE CULTURE
MICRO NUMBER:: 10175774
SPECIMEN QUALITY:: ADEQUATE

## 2019-12-10 ENCOUNTER — Other Ambulatory Visit: Payer: Self-pay

## 2019-12-10 ENCOUNTER — Ambulatory Visit (INDEPENDENT_AMBULATORY_CARE_PROVIDER_SITE_OTHER): Payer: BC Managed Care – PPO | Admitting: Family Medicine

## 2019-12-10 ENCOUNTER — Encounter: Payer: Self-pay | Admitting: Family Medicine

## 2019-12-10 VITALS — BP 137/74 | Ht 66.0 in | Wt 241.0 lb

## 2019-12-10 DIAGNOSIS — R7309 Other abnormal glucose: Secondary | ICD-10-CM | POA: Diagnosis not present

## 2019-12-10 DIAGNOSIS — E669 Obesity, unspecified: Secondary | ICD-10-CM | POA: Diagnosis not present

## 2019-12-10 DIAGNOSIS — R768 Other specified abnormal immunological findings in serum: Secondary | ICD-10-CM

## 2019-12-10 DIAGNOSIS — R7982 Elevated C-reactive protein (CRP): Secondary | ICD-10-CM | POA: Diagnosis not present

## 2019-12-10 DIAGNOSIS — M48061 Spinal stenosis, lumbar region without neurogenic claudication: Secondary | ICD-10-CM

## 2019-12-10 DIAGNOSIS — G8929 Other chronic pain: Secondary | ICD-10-CM

## 2019-12-10 DIAGNOSIS — M2550 Pain in unspecified joint: Secondary | ICD-10-CM

## 2019-12-10 LAB — SEDIMENTATION RATE: Sed Rate: 29 mm/h (ref 0–30)

## 2019-12-10 LAB — HEMOGLOBIN A1C
Hgb A1c MFr Bld: 6 % of total Hgb — ABNORMAL HIGH (ref <5.7)
Mean Plasma Glucose: 126 (calc)
eAG (mmol/L): 7 (calc)

## 2019-12-10 LAB — ANTI-NUCLEAR AB-TITER (ANA TITER): ANA Titer 1: 1:80 {titer} — ABNORMAL HIGH

## 2019-12-10 LAB — RHEUMATOID FACTOR: Rheumatoid fact SerPl-aCnc: <14 IU/mL (ref <14)

## 2019-12-10 LAB — CYCLIC CITRUL PEPTIDE ANTIBODY, IGG: Cyclic Citrullin Peptide Ab: <16 UNITS

## 2019-12-10 LAB — ANA: Anti Nuclear Antibody (ANA): POSITIVE — AB

## 2019-12-10 LAB — C-REACTIVE PROTEIN: CRP: 21.4 mg/L — ABNORMAL HIGH (ref <8.0)

## 2019-12-10 NOTE — Progress Notes (Signed)
Subjective:    Patient ID: Kara Porter, female    DOB: [birth year], 59 y.o.   MRN: [id number]  Kara Porter is a 59 y.o. female presenting on 12/10/2019 for lab result and Urinary Frequency  Virtual / Telehealth Encounter - Video Visit via [email or web address] The purpose of this virtual visit is to provide medical care while limiting exposure to the novel coronavirus (COVID19) for both patient and office staff.  Consent was obtained for remote visit:  Yes.   Answered questions that patient had about telehealth interaction:  Yes.   I discussed the limitations, risks, security and privacy concerns of performing an evaluation and management service by video/telephone. I also discussed with the patient that there may be a patient responsible charge related to this service. The patient expressed understanding and agreed to proceed.  Patient Location: Home Provider Location: [facility] (Office)   HPI   Wellness / Pre-Diabetes/ Obesity BMI >38 Due for lab now. Previous 5.7 Admits wt gain with lifestyle limitation with back pain. CBGs:Not checking sugar Meds:Never on  Positive ANA / CRP / Joint Pain / Swelling Lumbar Spinal Stenosis - Followed by Orthopedics, [place].  Recent history Steroid injections in back, helped x 2 rounds - Negative ESR (but high normal 29/30), CRP elevated >21, and negative RF and Anti CCP - POSITIVE ANA anti cytoplasmic high titer 1:80 - She is doing yoga and meditation - She has chronic pain all the time - Admits has sciatica symptoms. 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 She has stopped most carbs, but still having difficult - Sleeping 7 hours, limited due to pain   Health Maintenance:  Negative Cologuard 11/17/19 next due 2024  Depression screen [facility] 2/9 12/10/2019 08/06/2019 12/19/2018  Decreased Interest 0 0  0  Down, Depressed, Hopeless 0 0 0  PHQ - 2 Score 0 0 0    Social History   Tobacco Use  . Smoking status: Former Smoker    Packs/day: 0.75    Years: 6.00    Pack years: 4.50    Types: Cigarettes    Quit date: 2018    Years since quitting: 3.1  . Smokeless tobacco: Former Network engineer Use Topics  . Alcohol use: Not Currently  . Drug use: Never    Review of Systems Per HPI unless specifically indicated above     Objective:    BP 137/74   Ht '5\' 6"'  (1.676 m)   Wt 241 lb (109.3 kg)   LMP 11/24/2017 (Exact Date)   BMI 38.90 kg/m   Wt Readings from Last 3 Encounters:  12/10/19 241 lb (109.3 kg)  08/06/19 241 lb 9.6 oz (109.6 kg)  08/06/19 241 lb (109.3 kg)    Physical Exam   Note examination was completely remotely via video observation objective data only  Gen - well-appearing, no acute distress or apparent pain, comfortable HEENT - eyes appear clear without discharge or redness Heart/Lungs - cannot examine virtually - observed no evidence of coughing or labored breathing. Abd - cannot examine virtually  Skin - face visible today- no rash Neuro - awake, alert, oriented Psych - not anxious appearing  Results for orders placed or performed in visit on 12/08/19  Urine Culture   Specimen: Urine  Result Value Ref Range   MICRO NUMBER: [id number]  SPECIMEN QUALITY: Adequate    Sample Source URINE    STATUS: FINAL    ISOLATE 1:      Less than 10,000 CFU/mL of single Gram positive organism isolated. No further testing will be performed. If clinically indicated, recollection using a method to minimize contamination, with prompt transfer to Urine Culture Transport Tube, is recommended.  POCT Urinalysis Dipstick  Result Value Ref Range   Color, UA amber    Clarity, UA clear    Glucose, UA Negative Negative   Bilirubin, UA Negative    Ketones, UA Negative    Spec Grav, UA 1.025 1.010 - 1.025   Blood, UA Negative    pH, UA 5.0 5.0 - 8.0   Protein, UA Negative  Negative   Urobilinogen, UA 0.2 0.2 or 1.0 E.U./dL   Nitrite, UA Negative    Leukocytes, UA Negative Negative   Appearance     Odor        Assessment & Plan:   Problem List Items Addressed This Visit    Spinal stenosis of lumbar region without neurogenic claudication   Relevant Orders   Ambulatory referral to Rheumatology   Obesity (BMI 35.0-39.9 without comorbidity) - Primary   Elevated hemoglobin A1c    Other Visit Diagnoses    Elevated C-reactive protein (CRP)       Relevant Orders   Ambulatory referral to Rheumatology   ANA positive       Relevant Orders   Ambulatory referral to Rheumatology   Chronic pain of multiple joints       Relevant Orders   Ambulatory referral to Rheumatology      #Urinary Dysfunction UA negative. Urine culture negative. Reassurance Can refer to Uro in future if concerning symptoms, may need urodynamics  #Chronic joint pain, lumbar spinal stenosis POSITIVE ANA / CRP - Follow w/ [facility] for continued management of DJD lumbar spine - Now concerning abnormal screening for rheumatological condition, given her history of join pain and swelling inflammation, will pursue refer to [facility] at [facility]  She will call [facility] for dry needling / refer if need   Orders Placed This Encounter  Procedures  . Ambulatory referral to Rheumatology    Referral Priority:   Routine    Referral Type:   Consultation    Referral Reason:   Specialty Services Required    Requested Specialty:   Rheumatology    Number of Visits Requested:   1     No orders of the defined types were placed in this encounter.     Follow up plan: Return if symptoms worsen or fail to improve.   Patient verbalizes understanding with the above medical recommendations including the limitation of remote medical advice.  Specific follow-up and call-back criteria were given for patient to follow-up or seek medical care more urgently if needed.  Total duration of direct patient  care provided via video conference: 20 minutes   Nobie Putnam, [facility] 12/10/2019, 9:56 AM

## 2019-12-10 NOTE — Patient Instructions (Addendum)
I will refer to [facility] Rhe[facility]ology once I get ANA result. Regardless.  Call or message me if you need referral to   [id number] [place], [place] [address] Phone: [phone]  For Dry Needling  If interested can refer to Urology for bladder urodynamics, but currently negative urine dipstick and urine culture.  Negative Cologuard 11/17/19, next due 11/2022  Please schedule a Follow-up Appointment to: Return if symptoms worsen or fail to improve.  If you have any other questions or concerns, please feel free to call the office or send a message through [place]. You may also schedule an earlier appointment if necessary.  Additionally, you may be receiving a survey about your experience at our office within a few days to 1 week by e-mail or mail. We value your feedback.  Nobie Putnam, DO [place]

## 2019-12-11 NOTE — Addendum Note (Signed)
Addended by: Olin Hauser on: 12/11/2019 12:11 AM   Modules accepted: Level of Service

## 2019-12-11 NOTE — Assessment & Plan Note (Signed)
Mild elevated A1c to 6.0 Concern with obesity, HLD  Plan:  1. Not on any therapy currently  2. Encourage improved lifestyle - low carb, low sugar diet, reduce portion size, continue improving regular exercise 3. Follow-up 6 month PreDM A1c

## 2019-12-17 DIAGNOSIS — M545 Low back pain: Secondary | ICD-10-CM | POA: Diagnosis not present

## 2019-12-17 DIAGNOSIS — M5416 Radiculopathy, lumbar region: Secondary | ICD-10-CM | POA: Diagnosis not present

## 2019-12-17 DIAGNOSIS — R262 Difficulty in walking, not elsewhere classified: Secondary | ICD-10-CM | POA: Diagnosis not present

## 2019-12-22 DIAGNOSIS — R262 Difficulty in walking, not elsewhere classified: Secondary | ICD-10-CM | POA: Diagnosis not present

## 2019-12-22 DIAGNOSIS — M545 Low back pain: Secondary | ICD-10-CM | POA: Diagnosis not present

## 2019-12-22 DIAGNOSIS — M5416 Radiculopathy, lumbar region: Secondary | ICD-10-CM | POA: Diagnosis not present

## 2019-12-25 DIAGNOSIS — M5416 Radiculopathy, lumbar region: Secondary | ICD-10-CM | POA: Diagnosis not present

## 2019-12-25 DIAGNOSIS — R262 Difficulty in walking, not elsewhere classified: Secondary | ICD-10-CM | POA: Diagnosis not present

## 2019-12-25 DIAGNOSIS — M545 Low back pain: Secondary | ICD-10-CM | POA: Diagnosis not present

## 2019-12-30 DIAGNOSIS — M5416 Radiculopathy, lumbar region: Secondary | ICD-10-CM | POA: Diagnosis not present

## 2019-12-30 DIAGNOSIS — R262 Difficulty in walking, not elsewhere classified: Secondary | ICD-10-CM | POA: Diagnosis not present

## 2019-12-30 DIAGNOSIS — M545 Low back pain: Secondary | ICD-10-CM | POA: Diagnosis not present

## 2019-12-30 DIAGNOSIS — H04123 Dry eye syndrome of bilateral lacrimal glands: Secondary | ICD-10-CM | POA: Diagnosis not present

## 2020-01-02 DIAGNOSIS — M5416 Radiculopathy, lumbar region: Secondary | ICD-10-CM | POA: Diagnosis not present

## 2020-01-02 DIAGNOSIS — M545 Low back pain: Secondary | ICD-10-CM | POA: Diagnosis not present

## 2020-01-02 DIAGNOSIS — R262 Difficulty in walking, not elsewhere classified: Secondary | ICD-10-CM | POA: Diagnosis not present

## 2020-01-06 DIAGNOSIS — M5416 Radiculopathy, lumbar region: Secondary | ICD-10-CM | POA: Diagnosis not present

## 2020-01-06 DIAGNOSIS — R262 Difficulty in walking, not elsewhere classified: Secondary | ICD-10-CM | POA: Diagnosis not present

## 2020-01-06 DIAGNOSIS — M545 Low back pain: Secondary | ICD-10-CM | POA: Diagnosis not present

## 2020-01-09 DIAGNOSIS — R262 Difficulty in walking, not elsewhere classified: Secondary | ICD-10-CM | POA: Diagnosis not present

## 2020-01-09 DIAGNOSIS — M545 Low back pain: Secondary | ICD-10-CM | POA: Diagnosis not present

## 2020-01-09 DIAGNOSIS — M5416 Radiculopathy, lumbar region: Secondary | ICD-10-CM | POA: Diagnosis not present

## 2020-01-13 DIAGNOSIS — M5416 Radiculopathy, lumbar region: Secondary | ICD-10-CM | POA: Diagnosis not present

## 2020-01-13 DIAGNOSIS — R262 Difficulty in walking, not elsewhere classified: Secondary | ICD-10-CM | POA: Diagnosis not present

## 2020-01-13 DIAGNOSIS — M545 Low back pain: Secondary | ICD-10-CM | POA: Diagnosis not present

## 2020-01-15 DIAGNOSIS — M545 Low back pain: Secondary | ICD-10-CM | POA: Diagnosis not present

## 2020-01-15 DIAGNOSIS — M5416 Radiculopathy, lumbar region: Secondary | ICD-10-CM | POA: Diagnosis not present

## 2020-01-15 DIAGNOSIS — R262 Difficulty in walking, not elsewhere classified: Secondary | ICD-10-CM | POA: Diagnosis not present

## 2020-01-21 DIAGNOSIS — M545 Low back pain: Secondary | ICD-10-CM | POA: Diagnosis not present

## 2020-01-21 DIAGNOSIS — M5416 Radiculopathy, lumbar region: Secondary | ICD-10-CM | POA: Diagnosis not present

## 2020-01-21 DIAGNOSIS — R262 Difficulty in walking, not elsewhere classified: Secondary | ICD-10-CM | POA: Diagnosis not present

## 2020-01-28 ENCOUNTER — Other Ambulatory Visit: Payer: Self-pay | Admitting: Family Medicine

## 2020-01-28 DIAGNOSIS — R262 Difficulty in walking, not elsewhere classified: Secondary | ICD-10-CM | POA: Diagnosis not present

## 2020-01-28 DIAGNOSIS — R609 Edema, unspecified: Secondary | ICD-10-CM

## 2020-01-28 DIAGNOSIS — M545 Low back pain: Secondary | ICD-10-CM | POA: Diagnosis not present

## 2020-01-28 DIAGNOSIS — M5416 Radiculopathy, lumbar region: Secondary | ICD-10-CM | POA: Diagnosis not present

## 2020-01-28 NOTE — Telephone Encounter (Signed)
Requested Prescriptions  Pending Prescriptions Disp Refills  . hydrochlorothiazide (HYDRODIURIL) 25 MG tablet [Pharmacy Med Name: HYDROCHLOROTHIAZIDE 25 MG TAB] 90 tablet 0    Sig: TAKE 1 TABLET BY MOUTH EVERY DAY     Cardiovascular: Diuretics - Thiazide Passed - 01/28/2020  1:23 AM      Passed - Ca in normal range and within 360 days    Calcium  Date Value Ref Range Status  08/06/2019 9.2 8.6 - 10.4 mg/dL Final         Passed - Cr in normal range and within 360 days    Creat  Date Value Ref Range Status  08/06/2019 0.52 0.50 - 1.05 mg/dL Final    Comment:    For patients >11 years of age, the reference limit for Creatinine is approximately 13% higher for people identified as African-American. .          Passed - K in normal range and within 360 days    Potassium  Date Value Ref Range Status  08/06/2019 4.3 3.5 - 5.3 mmol/L Final         Passed - Na in normal range and within 360 days    Sodium  Date Value Ref Range Status  08/06/2019 141 135 - 146 mmol/L Final         Passed - Last BP in normal range    BP Readings from Last 1 Encounters:  12/10/19 137/74         Passed - Valid encounter within last 6 months    Recent Outpatient Visits          1 month ago Obesity (BMI 35.0-39.9 without comorbidity)   [place], DO   5 months ago Annual physical exam   [place], DO   1 year ago Post-influenza syndrome   [place], DO   1 year ago Annual physical exam   [facility] Olin Hauser, DO   1 year ago Irritable bowel syndrome with constipation   [place], DO

## 2020-03-03 DIAGNOSIS — M79671 Pain in right foot: Secondary | ICD-10-CM | POA: Diagnosis not present

## 2020-03-31 DIAGNOSIS — Z1321 Encounter for screening for nutritional disorder: Secondary | ICD-10-CM | POA: Diagnosis not present

## 2020-03-31 DIAGNOSIS — R768 Other specified abnormal immunological findings in serum: Secondary | ICD-10-CM | POA: Diagnosis not present

## 2020-03-31 DIAGNOSIS — M48061 Spinal stenosis, lumbar region without neurogenic claudication: Secondary | ICD-10-CM | POA: Diagnosis not present

## 2020-03-31 DIAGNOSIS — R635 Abnormal weight gain: Secondary | ICD-10-CM | POA: Diagnosis not present

## 2020-03-31 DIAGNOSIS — R7982 Elevated C-reactive protein (CRP): Secondary | ICD-10-CM | POA: Diagnosis not present

## 2020-04-20 ENCOUNTER — Other Ambulatory Visit: Payer: Self-pay | Admitting: Family Medicine

## 2020-04-20 DIAGNOSIS — R609 Edema, unspecified: Secondary | ICD-10-CM

## 2020-04-20 DIAGNOSIS — L739 Follicular disorder, unspecified: Secondary | ICD-10-CM | POA: Diagnosis not present

## 2020-04-20 DIAGNOSIS — D229 Melanocytic nevi, unspecified: Secondary | ICD-10-CM | POA: Diagnosis not present

## 2020-04-20 DIAGNOSIS — D492 Neoplasm of unspecified behavior of bone, soft tissue, and skin: Secondary | ICD-10-CM | POA: Diagnosis not present

## 2020-04-20 DIAGNOSIS — L928 Other granulomatous disorders of the skin and subcutaneous tissue: Secondary | ICD-10-CM | POA: Diagnosis not present

## 2020-04-20 DIAGNOSIS — L820 Inflamed seborrheic keratosis: Secondary | ICD-10-CM | POA: Diagnosis not present

## 2020-04-20 NOTE — Telephone Encounter (Signed)
Requested Prescriptions  Pending Prescriptions Disp Refills  . hydrochlorothiazide (HYDRODIURIL) 25 MG tablet [Pharmacy Med Name: HYDROCHLOROTHIAZIDE 25 MG TAB] 90 tablet 0    Sig: TAKE 1 TABLET BY MOUTH EVERY DAY     Cardiovascular: Diuretics - Thiazide Passed - 04/20/2020  2:02 AM      Passed - Ca in normal range and within 360 days    Calcium  Date Value Ref Range Status  08/06/2019 9.2 8.6 - 10.4 mg/dL Final         Passed - Cr in normal range and within 360 days    Creat  Date Value Ref Range Status  08/06/2019 0.52 0.50 - 1.05 mg/dL Final    Comment:    For patients >37 years of age, the reference limit for Creatinine is approximately 13% higher for people identified as African-American. .          Passed - K in normal range and within 360 days    Potassium  Date Value Ref Range Status  08/06/2019 4.3 3.5 - 5.3 mmol/L Final         Passed - Na in normal range and within 360 days    Sodium  Date Value Ref Range Status  08/06/2019 141 135 - 146 mmol/L Final         Passed - Last BP in normal range    BP Readings from Last 1 Encounters:  12/10/19 137/74         Passed - Valid encounter within last 6 months    Recent Outpatient Visits          4 months ago Obesity (BMI 35.0-39.9 without comorbidity)   [place], DO   8 months ago Annual physical exam   [place], DO   1 year ago Post-influenza syndrome   [place], DO   1 year ago Annual physical exam   [facility] Olin Hauser, DO   1 year ago Irritable bowel syndrome with constipation   [place], DO

## 2020-07-27 ENCOUNTER — Other Ambulatory Visit: Payer: Self-pay | Admitting: Family Medicine

## 2020-07-27 DIAGNOSIS — R609 Edema, unspecified: Secondary | ICD-10-CM

## 2020-07-27 NOTE — Telephone Encounter (Signed)
Appointment 08/18/20- RF granted #[id number]

## 2020-08-18 ENCOUNTER — Ambulatory Visit (INDEPENDENT_AMBULATORY_CARE_PROVIDER_SITE_OTHER): Payer: Managed Care, Other (non HMO) | Admitting: Family Medicine

## 2020-08-18 ENCOUNTER — Other Ambulatory Visit: Payer: Self-pay

## 2020-08-18 ENCOUNTER — Encounter: Payer: Self-pay | Admitting: Family Medicine

## 2020-08-18 VITALS — BP 127/71 | HR 80 | Temp 97.3°F | Resp 16 | Ht 66.0 in | Wt 251.6 lb

## 2020-08-18 DIAGNOSIS — Z1231 Encounter for screening mammogram for malignant neoplasm of breast: Secondary | ICD-10-CM | POA: Diagnosis not present

## 2020-08-18 DIAGNOSIS — R609 Edema, unspecified: Secondary | ICD-10-CM

## 2020-08-18 DIAGNOSIS — R7309 Other abnormal glucose: Secondary | ICD-10-CM | POA: Diagnosis not present

## 2020-08-18 DIAGNOSIS — Z0000 Encounter for general adult medical examination without abnormal findings: Secondary | ICD-10-CM | POA: Diagnosis not present

## 2020-08-18 DIAGNOSIS — Z6841 Body Mass Index (BMI) 40.0 and over, adult: Secondary | ICD-10-CM

## 2020-08-18 DIAGNOSIS — E7800 Pure hypercholesterolemia, unspecified: Secondary | ICD-10-CM

## 2020-08-18 DIAGNOSIS — B009 Herpesviral infection, unspecified: Secondary | ICD-10-CM

## 2020-08-18 MED ORDER — VALACYCLOVIR HCL 500 MG PO TABS: 500.0000 mg | ORAL_TABLET | Freq: Three times a day (TID) | ORAL | 2 refills | Status: DC | PRN

## 2020-08-18 MED ORDER — HYDROCHLOROTHIAZIDE 25 MG PO TABS: 25.0000 mg | ORAL_TABLET | Freq: Every day | ORAL | 3 refills | Status: DC

## 2020-08-18 NOTE — Progress Notes (Signed)
Subjective:    Patient ID: Kara Porter, female    DOB: Oct 08, 1961, 59 y.o.   MRN: [id number]  Kara Porter is a 59 y.o. female presenting on 08/18/2020 for Annual Exam   HPI   Here for Annual Physical and Due fasting Lab Orders.   Wellness / Pre-Diabetes/ Morbid Obesity BMI >40 Prior lab A1c 6.0 Admits wt gain with lifestyle limitation with back pain. CBGs:Not checking sugar Meds:Never on  HYPERLIPIDEMIA: - Reportsno concerns. Last lipid 2020, reviewed, LDL 152 - Not on cholesterol medicine  Thrombocytosis, elevated platelets Prior history of mild elevated platelets for past 2 years+, with range 400 to 430 Due for lab with CBC for PLT count. Discussed inflammation as possible cause. Admits easy bruising, more frequently, but no bleeding problem  Vitamin D deficiency Treated by Rheumatology, 50k weekly.  Post menopausal / Weight Gain / Fatigue  asking about testosterone, insulin random, cortisol - daughter dx with PCOS, she required ablation prior to menopause.  Additional topics today:  PMH - IBS (see prior notes for background), remains chronic problem. Improving diet. Former Smoker  Lumbar Spinal Stenosis - Followed by Orthopedics, [place].   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  Health Maintenance:  [place] up to date,   Due Flu / TDap- declines today  Prior colonoscopy, [facility] - no record received, initial colonoscopy age 44, no polyps. Now completed Cologuard 11/17/19 - NEGATIVE, good for 3 years, next 2024.  F/u with OBGYN for Mammogram as scheduled. - Last pap 2019, next due with GYN 2022    Depression screen [facility] 2/9 08/18/2020 12/10/2019 08/06/2019  Decreased Interest 0 0 0  Down, Depressed, Hopeless 0 0 0  PHQ - 2 Score 0 0 0    Past Medical History:  Diagnosis Date  . Irritable bowel syndrome 2019   Past Surgical History:  Procedure Laterality Date  . [place]  .  [place]  . HYSTEROSCOPY  2018   Social History   Socioeconomic History  . Marital status: Married    Spouse name: Not on file  . Number of children: 2  . Years of education: College  . Highest education level: Bachelor's degree (e.g., BA, AB, BS)  Occupational History  . Occupation: HR Manager    Comment: [employer]  Tobacco Use  . Smoking status: Former Smoker    Packs/day: 0.75    Years: 6.00    Pack years: 4.50    Types: Cigarettes    Quit date: 2018    Years since quitting: 3.8  . Smokeless tobacco: Former Network engineer  . Vaping Use: Never used  Substance and Sexual Activity  . Alcohol use: Not Currently  . Drug use: Never  . Sexual activity: Not Currently    Birth control/protection: None  Other Topics Concern  . Not on file  Social History Narrative  . Not on file   Social Determinants of Health   Financial Resource Strain:   . Difficulty of Paying Living Expenses: Not on file  Food Insecurity:   . Worried About Charity fundraiser in the Last Year: Not on file  . Ran Out of Food in the Last Year: Not on file  Transportation Needs:   . Lack of Transportation (Medical): Not on file  . Lack of Transportation (Non-Medical): Not on file  Physical Activity:   . Days of Exercise per Week: Not on file  .  Minutes of Exercise per Session: Not on file  Stress:   . Feeling of Stress : Not on file  Social Connections:   . Frequency of Communication with Friends and Family: Not on file  . Frequency of Social Gatherings with Friends and Family: Not on file  . Attends Religious Services: Not on file  . Active Member of Clubs or Organizations: Not on file  . Attends Archivist Meetings: Not on file  . Marital Status: Not on file  Intimate Partner Violence:   . Fear of Current or Ex-Partner: Not on file  . Emotionally Abused: Not on file  . Physically Abused: Not on file  . Sexually Abused: Not on file   Family History  Problem  Relation Age of Onset  . Hyperlipidemia Mother   . Heart disease Mother   . Cancer Father   . Hyperlipidemia Father   . Breast cancer Neg Hx    Current Outpatient Medications on File Prior to Visit  Medication Sig  . Albuterol Sulfate (PROAIR RESPICLICK) 720 (90 Base) MCG/ACT AEPB Inhale 2 puffs into the lungs every 6 (six) hours as needed.  Marland Kitchen ipratropium (ATROVENT) 0.06 % nasal spray Place 2 sprays into both nostrils 4 (four) times daily. For up to 5-7 days then stop.  . meloxicam (MOBIC) 15 MG tablet as needed.   . Multiple Vitamin (MULTIVITAMIN) LIQD Take 5 mLs by mouth daily.  . polyethylene glycol (MIRALAX / GLYCOLAX) packet Take 17 g by mouth daily.  . psyllium (METAMUCIL) 58.6 % packet Take 1 packet by mouth daily.  Marland Kitchen triamcinolone cream (KENALOG) 0.1 % Apply 1 application topically 2 (two) times daily as needed. For 1-2 weeks. Do not use on face.   No current facility-administered medications on file prior to visit.    Review of Systems  Constitutional: Negative for activity change, appetite change, chills, diaphoresis, fatigue and fever.  HENT: Negative for congestion and hearing loss.   Eyes: Negative for visual disturbance.  Respiratory: Negative for cough, chest tightness, shortness of breath and wheezing.   Cardiovascular: Negative for chest pain, palpitations and leg swelling.  Gastrointestinal: Negative for abdominal pain, constipation, diarrhea, nausea and vomiting.  Genitourinary: Negative for dysuria, frequency and hematuria.  Musculoskeletal: Negative for arthralgias and neck pain.  Skin: Negative for rash.  Neurological: Negative for dizziness, weakness, light-headedness, numbness and headaches.  Hematological: Negative for adenopathy.  Psychiatric/Behavioral: Negative for behavioral problems, dysphoric mood and sleep disturbance.   Per HPI unless specifically indicated above      Objective:    BP 127/71   Pulse 80   Temp (!) 97.3 F (36.3 C) (Temporal)    Resp 16   Ht 5\' 6"  (1.676 m)   Wt 251 lb 9.6 oz (114.1 kg)   LMP 11/24/2017 (Exact Date)   SpO2 98%   BMI 40.61 kg/m   Wt Readings from Last 3 Encounters:  08/18/20 251 lb 9.6 oz (114.1 kg)  12/10/19 241 lb (109.3 kg)  08/06/19 241 lb 9.6 oz (109.6 kg)    Physical Exam Vitals and nursing note reviewed.  Constitutional:      General: She is not in acute distress.    Appearance: She is well-developed. She is obese. She is not diaphoretic.     Comments: Well-appearing, comfortable, cooperative  HENT:     Head: Normocephalic and atraumatic.  Eyes:     General:        Right eye: No discharge.  Left eye: No discharge.     Conjunctiva/sclera: Conjunctivae normal.     Pupils: Pupils are equal, round, and reactive to light.  Neck:     Thyroid: No thyromegaly.     Vascular: No carotid bruit.  Cardiovascular:     Rate and Rhythm: Normal rate and regular rhythm.     Heart sounds: Normal heart sounds. No murmur heard.   Pulmonary:     Effort: Pulmonary effort is normal. No respiratory distress.     Breath sounds: Normal breath sounds. No wheezing or rales.  Abdominal:     General: Bowel sounds are normal. There is no distension.     Palpations: Abdomen is soft. There is no mass.     Tenderness: There is no abdominal tenderness.  Musculoskeletal:        General: No tenderness. Normal range of motion.     Cervical back: Normal range of motion and neck supple. No tenderness.     Comments: Upper / Lower Extremities: - Normal muscle tone, strength bilateral upper extremities 5/5, lower extremities 5/5  Lymphadenopathy:     Cervical: No cervical adenopathy.  Skin:    General: Skin is warm and dry.     Findings: No erythema or rash.  Neurological:     Mental Status: She is alert and oriented to person, place, and time.     Comments: Distal sensation intact to light touch all extremities  Psychiatric:        Behavior: Behavior normal.     Comments: Well groomed, good eye  contact, normal speech and thoughts      I have personally reviewed the radiology report from [facility] 09/29/19  CLINICAL DATA:  Screening.  EXAM: DIGITAL SCREENING BILATERAL MAMMOGRAM WITH TOMO AND CAD  COMPARISON:  Previous exam(s).  ACR Breast Density Category b: There are scattered areas of fibroglandular density.  FINDINGS: There are no findings suspicious for malignancy. Images were processed with CAD.  IMPRESSION: No mammographic evidence of malignancy. A result letter of this screening mammogram will be mailed directly to the patient.  RECOMMENDATION: Screening mammogram in one year. (Code:SM-B-01Y)  BI-RADS CATEGORY  1: Negative.   Electronically Signed   By: Ammie Ferrier M.D.   On: 09/29/2019 13:55  Results for orders placed or performed in visit on 12/08/19  Urine Culture   Specimen: Urine  Result Value Ref Range   MICRO NUMBER: [id number]    SPECIMEN QUALITY: Adequate    Sample Source URINE    STATUS: FINAL    ISOLATE 1:      Less than 10,000 CFU/mL of single Gram positive organism isolated. No further testing will be performed. If clinically indicated, recollection using a method to minimize contamination, with prompt transfer to Urine Culture Transport Tube, is recommended.  POCT Urinalysis Dipstick  Result Value Ref Range   Color, UA amber    Clarity, UA clear    Glucose, UA Negative Negative   Bilirubin, UA Negative    Ketones, UA Negative    Spec Grav, UA 1.025 1.010 - 1.025   Blood, UA Negative    pH, UA 5.0 5.0 - 8.0   Protein, UA Negative Negative   Urobilinogen, UA 0.2 0.2 or 1.0 E.U./dL   Nitrite, UA Negative    Leukocytes, UA Negative Negative   Appearance     Odor        Assessment & Plan:   Problem List Items Addressed This Visit    Morbid obesity with BMI of 40.0-44.9,  adult [facility])   Relevant Orders   Testosterone   Insulin, random   Hemoglobin A1c   CBC with Differential/Platelet   COMPLETE METABOLIC PANEL  WITH GFR   Lipid panel   TSH   Hypercholesteremia   Relevant Medications   hydrochlorothiazide (HYDRODIURIL) 25 MG tablet   Other Relevant Orders   Lipid panel   TSH   Fluid retention   Relevant Medications   hydrochlorothiazide (HYDRODIURIL) 25 MG tablet   Elevated hemoglobin A1c   Relevant Orders   Insulin, random   Hemoglobin A1c    Other Visit Diagnoses    Annual physical exam    -  Primary   Relevant Orders   Insulin, random   Hemoglobin A1c   CBC with Differential/Platelet   COMPLETE METABOLIC PANEL WITH GFR   Lipid panel   TSH   Encounter for screening mammogram for malignant neoplasm of breast       Relevant Orders   MM 3D SCREEN BREAST BILATERAL   HSV-2 (herpes simplex virus 2) infection       Relevant Medications   valACYclovir (VALTREX) 500 MG tablet      Updated Health Maintenance information - Mammogram ordered - Colon CA screen 2024 - Pap smear in 2022 Labs ordered Encouraged improvement to lifestyle with diet and exercise - Goal of weight loss  Possible PCOS Ordered Testosterone and other lab, will f/u results consider Endocrinology refer in future if indicated, she can f/u with GYN as well.   Meds ordered this encounter  Medications  . hydrochlorothiazide (HYDRODIURIL) 25 MG tablet    Sig: Take 1 tablet (25 mg total) by mouth daily.    Dispense:  90 tablet    Refill:  3  . valACYclovir (VALTREX) 500 MG tablet    Sig: Take 1 tablet (500 mg total) by mouth 3 (three) times daily as needed. PRN herpes flare    Dispense:  90 tablet    Refill:  2     Follow up plan: Return in about 6 months (around 02/15/2021) for 6 month follow-up.  Nobie Putnam, [facility] 08/18/2020, 9:09 AM

## 2020-08-18 NOTE — Patient Instructions (Addendum)
Thank you for coming to the office today.  For Mammogram screening for breast cancer   Call the [place] below anytime to schedule your own appointment now that order has been placed.  [facility] [place], [place] [address] Phone: [phone]  Cologuard or Colonoscopy 2024  Pap Smear 2022  Endocrinology - let me know after labs if need refer  [facility] [place] [place], [place]  [address] Phone: [phone]   [facility] [address] [place],    [address] Main: [phone]  Future consideration for weight loss if decide - would be preferred injectable treatment - with:  - Diabetes/PreDiabetes medicine - Ozempic, Trulicity, Victoza  - Weight Loss - Saxenda (Victoza), Wegovy (Ozempic)  Other category - Contrave   Please schedule a Follow-up Appointment to: Return in about 6 months (around 02/15/2021) for 6 month follow-up.  If you have any other questions or concerns, please feel free to call the office or send a message through [place]. You may also schedule an earlier appointment if necessary.  Additionally, you may be receiving a survey about your experience at our office within a few days to 1 week by e-mail or mail. We value your feedback.  Nobie Putnam, DO [place]

## 2020-08-19 LAB — LIPID PANEL
Cholesterol: 212 mg/dL — ABNORMAL HIGH (ref <200)
HDL: 49 mg/dL — ABNORMAL LOW (ref >=50)
LDL Cholesterol (Calc): 135 mg/dL (calc) — ABNORMAL HIGH
Non-HDL Cholesterol (Calc): 163 mg/dL (calc) — ABNORMAL HIGH (ref <130)
Total CHOL/HDL Ratio: 4.3 (calc) (ref <5.0)
Triglycerides: 146 mg/dL (ref <150)

## 2020-08-19 LAB — TSH: TSH: 1.33 mIU/L (ref 0.40–4.50)

## 2020-08-19 LAB — COMPLETE METABOLIC PANEL WITH GFR
AG Ratio: 1.4 (calc) (ref 1.0–2.5)
ALT: 24 U/L (ref 6–29)
AST: 21 U/L (ref 10–35)
Albumin: 4.2 g/dL (ref 3.6–5.1)
Alkaline phosphatase (APISO): 91 U/L (ref 37–153)
BUN: 10 mg/dL (ref 7–25)
CO2: 28 mmol/L (ref 20–32)
Calcium: 9.4 mg/dL (ref 8.6–10.4)
Chloride: 102 mmol/L (ref 98–110)
Creat: 0.58 mg/dL (ref 0.50–1.05)
GFR, Est African American: 119 mL/min/{1.73_m2} (ref >=60)
GFR, Est Non African American: 102 mL/min/{1.73_m2} (ref >=60)
Globulin: 2.9 g/dL (calc) (ref 1.9–3.7)
Glucose, Bld: 103 mg/dL — ABNORMAL HIGH (ref 65–99)
Potassium: 3.9 mmol/L (ref 3.5–5.3)
Sodium: 140 mmol/L (ref 135–146)
Total Bilirubin: 0.4 mg/dL (ref 0.2–1.2)
Total Protein: 7.1 g/dL (ref 6.1–8.1)

## 2020-08-19 LAB — CBC WITH DIFFERENTIAL/PLATELET
Absolute Monocytes: 432 cells/uL (ref 200–950)
Basophils Absolute: 18 cells/uL (ref 0–200)
Basophils Relative: 0.2 %
Eosinophils Absolute: 126 cells/uL (ref 15–500)
Eosinophils Relative: 1.4 %
HCT: 39.9 % (ref 35.0–45.0)
Hemoglobin: 13.9 g/dL (ref 11.7–15.5)
Lymphs Abs: 2601 cells/uL (ref 850–3900)
MCH: 32 pg (ref 27.0–33.0)
MCHC: 34.8 g/dL (ref 32.0–36.0)
MCV: 91.9 fL (ref 80.0–100.0)
MPV: 10.5 fL (ref 7.5–12.5)
Monocytes Relative: 4.8 %
Neutro Abs: 5823 cells/uL (ref 1500–7800)
Neutrophils Relative %: 64.7 %
Platelets: 359 10*3/uL (ref 140–400)
RBC: 4.34 10*6/uL (ref 3.80–5.10)
RDW: 12.5 % (ref 11.0–15.0)
Total Lymphocyte: 28.9 %
WBC: 9 10*3/uL (ref 3.8–10.8)

## 2020-08-19 LAB — TESTOSTERONE,TOTAL,MALES,(ADULT),IMMUNOASSAY(REFL): Testosterone: 23 ng/dL

## 2020-08-19 LAB — HEMOGLOBIN A1C
Hgb A1c MFr Bld: 6.1 % of total Hgb — ABNORMAL HIGH (ref <5.7)
Mean Plasma Glucose: 128 (calc)
eAG (mmol/L): 7.1 (calc)

## 2020-08-19 LAB — INSULIN, RANDOM: Insulin: 15.2 u[IU]/mL

## 2020-11-09 ENCOUNTER — Telehealth (INDEPENDENT_AMBULATORY_CARE_PROVIDER_SITE_OTHER): Payer: Managed Care, Other (non HMO) | Admitting: Family Medicine

## 2020-11-09 ENCOUNTER — Encounter: Payer: Self-pay | Admitting: Family Medicine

## 2020-11-09 VITALS — Ht 66.0 in | Wt 250.0 lb

## 2020-11-09 DIAGNOSIS — J3089 Other allergic rhinitis: Secondary | ICD-10-CM | POA: Diagnosis not present

## 2020-11-09 DIAGNOSIS — J0110 Acute frontal sinusitis, unspecified: Secondary | ICD-10-CM | POA: Diagnosis not present

## 2020-11-09 DIAGNOSIS — B379 Candidiasis, unspecified: Secondary | ICD-10-CM | POA: Diagnosis not present

## 2020-11-09 MED ORDER — AMOXICILLIN-POT CLAVULANATE 875-125 MG PO TABS: 1.0000 | ORAL_TABLET | Freq: Two times a day (BID) | ORAL | 0 refills | Status: DC

## 2020-11-09 MED ORDER — MONTELUKAST SODIUM 10 MG PO TABS: 10.0000 mg | ORAL_TABLET | Freq: Every day | ORAL | 1 refills | Status: DC

## 2020-11-09 MED ORDER — FLUCONAZOLE 150 MG PO TABS: ORAL_TABLET | ORAL | 0 refills | Status: DC

## 2020-11-09 NOTE — Progress Notes (Signed)
Virtual Visit via Telephone The purpose of this virtual visit is to provide medical care while limiting exposure to the novel coronavirus (COVID19) for both patient and office staff.  Consent was obtained for phone visit:  Yes.   Answered questions that patient had about telehealth interaction:  Yes.   I discussed the limitations, risks, security and privacy concerns of performing an evaluation and management service by telephone. I also discussed with the patient that there may be a patient responsible charge related to this service. The patient expressed understanding and agreed to proceed.  Patient Location: Home Provider Location: [facility] (Office)  Participants in virtual visit: - Patient: Kara Porter - CMA: Burnell Blanks, CMA - Provider: Dr Althea Charon  ---------------------------------------------------------------------- Chief Complaint  Patient presents with  . Sore Throat    S: Reviewed CMA documentation. I have called patient and gathered additional HPI as follows:  Sinusitis Sore Throat / Pharyngitis Environmental Allergies Reports that symptoms started >3 weeks ago closer to near year, she had initial significant laryngitis with sore throat initially, felt like "glass shards" causing pain. Since that time she has gradually improved her hoarse voice. Her sore throat has improved gradually. Admits significant now progression to more sinus congestion, usually deeper sinuses and can alternate between left and right. - She started Flonase medication for drainage - Tried OTC throat lozenges. Takes Claritin at night. - Up to date on COVID vaccine 3rd booster dose Pfizer 10/22/20 approximately. - She did a home antigen COVID 2 step test on 10/15/20, negative. Admits post nasal drainage  Denies any high risk travel to areas of current concern for COVID19. Denies any known or suspected exposure to person with or possibly with COVID19.  Denies any fevers,  chills, sweats, body ache, cough, shortness of breath, headache, abdominal pain, diarrhea  Past Medical History:  Diagnosis Date  . Irritable bowel syndrome 2019   Social History   Tobacco Use  . Smoking status: Former Smoker    Packs/day: 0.75    Years: 6.00    Pack years: 4.50    Types: Cigarettes    Quit date: 2018    Years since quitting: 4.0  . Smokeless tobacco: Former Clinical biochemist  . Vaping Use: Never used  Substance Use Topics  . Alcohol use: Not Currently  . Drug use: Never    Current Outpatient Medications:  .  Albuterol Sulfate (PROAIR RESPICLICK) 108 (90 Base) MCG/ACT AEPB, Inhale 2 puffs into the lungs every 6 (six) hours as needed., Disp: 1 each, Rfl: 0 .  amoxicillin-clavulanate (AUGMENTIN) 875-125 MG tablet, Take 1 tablet by mouth 2 (two) times daily. For 10 days, Disp: 20 tablet, Rfl: 0 .  fluconazole (DIFLUCAN) 150 MG tablet, Take one tablet by mouth on Day 1. Repeat dose 2nd tablet on Day 3., Disp: 2 tablet, Rfl: 0 .  hydrochlorothiazide (HYDRODIURIL) 25 MG tablet, Take 1 tablet (25 mg total) by mouth daily., Disp: 90 tablet, Rfl: 3 .  ipratropium (ATROVENT) 0.06 % nasal spray, Place 2 sprays into both nostrils 4 (four) times daily. For up to 5-7 days then stop., Disp: 15 mL, Rfl: 1 .  montelukast (SINGULAIR) 10 MG tablet, Take 1 tablet (10 mg total) by mouth at bedtime., Disp: 90 tablet, Rfl: 1 .  Multiple Vitamin (MULTIVITAMIN) LIQD, Take 5 mLs by mouth daily., Disp: , Rfl:  .  psyllium (METAMUCIL) 58.6 % packet, Take 1 packet by mouth daily., Disp: , Rfl:  .  triamcinolone cream (KENALOG)  0.1 %, Apply 1 application topically 2 (two) times daily as needed. For 1-2 weeks. Do not use on face., Disp: 30 g, Rfl: 0 .  Turmeric (QC TUMERIC COMPLEX PO), Take 1,000 mg by mouth., Disp: , Rfl:  .  valACYclovir (VALTREX) 500 MG tablet, Take 1 tablet (500 mg total) by mouth 3 (three) times daily as needed. PRN herpes flare, Disp: 90 tablet, Rfl: 2 .  meloxicam (MOBIC)  15 MG tablet, as needed.  (Patient not taking: Reported on 11/09/2020), Disp: , Rfl:  .  polyethylene glycol (MIRALAX / GLYCOLAX) packet, Take 17 g by mouth daily. (Patient not taking: Reported on 11/09/2020), Disp: , Rfl:   Depression screen [facility] 2/9 08/18/2020 12/10/2019 08/06/2019  Decreased Interest 0 0 0  Down, Depressed, Hopeless 0 0 0  PHQ - 2 Score 0 0 0    No flowsheet data found.  -------------------------------------------------------------------------- O: No physical exam performed due to remote telephone encounter.  Lab results reviewed.  Recent Results (from the past 2160 hour(s))  Insulin, random     Status: None   Collection Time: 08/18/20  9:35 AM  Result Value Ref Range   Insulin 15.2 uIU/mL    Comment:      Reference Range  < or = 19.6 .      Risk:      Optimal          < or = 19.6      Moderate         NA      High             >19.6 .      Adult cardiovascular event risk category      cut points (optimal, moderate, high)      are based on [employer] population      data from 09/2010. . This insulin assay shows strong cross-reactivity for some insulin analogs (lispro, aspart, and glargine) and much lower cross-reactivity with others (detemir, glulisine).   Hemoglobin A1c     Status: Abnormal   Collection Time: 08/18/20  9:35 AM  Result Value Ref Range   Hgb A1c MFr Bld 6.1 (H) <5.7 % of total Hgb    Comment: For someone without known diabetes, a hemoglobin  A1c value between 5.7% and 6.4% is consistent with prediabetes and should be confirmed with a  follow-up test. . For someone with known diabetes, a value <7% indicates that their diabetes is well controlled. A1c targets should be individualized based on duration of diabetes, age, comorbid conditions, and other considerations. . This assay result is consistent with an increased risk of diabetes. . Currently, no consensus exists regarding use of hemoglobin A1c for diagnosis of diabetes  for children. .    Mean Plasma Glucose 128 (calc)   eAG (mmol/L) 7.1 (calc)  CBC with Differential/Platelet     Status: None   Collection Time: 08/18/20  9:35 AM  Result Value Ref Range   WBC 9.0 3.8 - 10.8 Thousand/uL   RBC 4.34 3.80 - 5.10 Million/uL   Hemoglobin 13.9 11.7 - 15.5 g/dL   HCT 39.9 35.0 - 45.0 %   MCV 91.9 80.0 - 100.0 fL   MCH 32.0 27.0 - 33.0 pg   MCHC 34.8 32.0 - 36.0 g/dL   RDW 12.5 11.0 - 15.0 %   Platelets 359 140 - 400 Thousand/uL   MPV 10.5 7.5 - 12.5 fL   Neutro Abs 5,823 1,500 - 7,800 cells/uL   Lymphs Abs  2,601 850 - 3,900 cells/uL   Absolute Monocytes 432 200 - 950 cells/uL   Eosinophils Absolute 126 15 - 500 cells/uL   Basophils Absolute 18 0 - 200 cells/uL   Neutrophils Relative % 64.7 %   Total Lymphocyte 28.9 %   Monocytes Relative 4.8 %   Eosinophils Relative 1.4 %   Basophils Relative 0.2 %  COMPLETE METABOLIC PANEL WITH GFR     Status: Abnormal   Collection Time: 08/18/20  9:35 AM  Result Value Ref Range   Glucose, Bld 103 (H) 65 - 99 mg/dL    Comment: .            Fasting reference interval . For someone without known diabetes, a glucose value between 100 and 125 mg/dL is consistent with prediabetes and should be confirmed with a follow-up test. .    BUN 10 7 - 25 mg/dL   Creat 0.58 0.50 - 1.05 mg/dL    Comment: For patients >14 years of age, the reference limit for Creatinine is approximately 13% higher for people identified as African-American. .    GFR, Est Non African American 102 > OR = 60 mL/min/1.56m2   GFR, Est African American 119 > OR = 60 mL/min/1.49m2   BUN/Creatinine Ratio NOT APPLICABLE 6 - 22 (calc)   Sodium 140 135 - 146 mmol/L   Potassium 3.9 3.5 - 5.3 mmol/L   Chloride 102 98 - 110 mmol/L   CO2 28 20 - 32 mmol/L   Calcium 9.4 8.6 - 10.4 mg/dL   Total Protein 7.1 6.1 - 8.1 g/dL   Albumin 4.2 3.6 - 5.1 g/dL   Globulin 2.9 1.9 - 3.7 g/dL (calc)   AG Ratio 1.4 1.0 - 2.5 (calc)   Total Bilirubin 0.4 0.2 - 1.2  mg/dL   Alkaline phosphatase (APISO) 91 37 - 153 U/L   AST 21 10 - 35 U/L   ALT 24 6 - 29 U/L  Lipid panel     Status: Abnormal   Collection Time: 08/18/20  9:35 AM  Result Value Ref Range   Cholesterol 212 (H) <200 mg/dL   HDL 49 (L) > OR = 50 mg/dL   Triglycerides 146 <150 mg/dL   LDL Cholesterol (Calc) 135 (H) mg/dL (calc)    Comment: Reference range: <100 . Desirable range <100 mg/dL for primary prevention;   <70 mg/dL for patients with CHD or diabetic patients  with > or = 2 CHD risk factors. Marland Kitchen LDL-C is now calculated using the Martin-Hopkins  calculation, which is a validated novel method providing  better accuracy than the Friedewald equation in the  estimation of LDL-C.  Cresenciano Genre et al. Annamaria Helling. 8546;270(35): 2061-2068  (http://education.[employer].com/faq/FAQ164)    Total CHOL/HDL Ratio 4.3 <5.0 (calc)   Non-HDL Cholesterol (Calc) 163 (H) <130 mg/dL (calc)    Comment: For patients with diabetes plus 1 major ASCVD risk  factor, treating to a non-HDL-C goal of <100 mg/dL  (LDL-C of <70 mg/dL) is considered a therapeutic  option.   TSH     Status: None   Collection Time: 08/18/20  9:35 AM  Result Value Ref Range   TSH 1.33 0.40 - 4.50 mIU/L  Testosterone,Total,Males,(Adults),Immunoassay (REFL)     Status: None   Collection Time: 08/18/20  9:35 AM  Result Value Ref Range   Testosterone 23 ng/dL    Comment:  Reference Range                                        Not applicable . All test requests for Testosterone on female and  pediatric (<18 years) patients must use test code [phone] - Testosterone, Total, LC/MS/MS. In hypogonadal males, Testosterone, Total, LC/MS/MS, is the recommended assay due to the diminished accuracy of immunoassay at levels below 250 ng/dL. This test code [phone]) must be collected in a red-top tube with no gel.       -------------------------------------------------------------------------- A&P:  Problem List Items Addressed This Visit   None   Visit Diagnoses    Acute non-recurrent frontal sinusitis    -  Primary   Relevant Medications   amoxicillin-clavulanate (AUGMENTIN) 875-125 MG tablet   fluconazole (DIFLUCAN) 150 MG tablet   Antibiotic-induced yeast infection       Relevant Medications   fluconazole (DIFLUCAN) 150 MG tablet   Environmental and seasonal allergies       Relevant Medications   montelukast (SINGULAIR) 10 MG tablet     Consistent with acute frontal rhinosinusitis, likely initially viral URI vs allergic rhinitis component with worsening concern for bacterial infection now >3 week duration second sickening switch to more sinus drainage.  Fully vaccinated against covid, last booster pfizer 10/22/20 need copy of card covid home antigen double negative test 10/15/20  Plan: 1. Start Augmentin 875-125mg  PO BID x 10 days - Diflucan just in case yeast infection 2. Continue nasal steroid Flonase 2 sprays in each nostril daily for 4-6 weeks, may repeat course seasonally or as needed 3. Continue Claritin 4. Add Singulair 10mg  nightly for allergies  Return criteria reviewed   Meds ordered this encounter  Medications  . montelukast (SINGULAIR) 10 MG tablet    Sig: Take 1 tablet (10 mg total) by mouth at bedtime.    Dispense:  90 tablet    Refill:  1  . amoxicillin-clavulanate (AUGMENTIN) 875-125 MG tablet    Sig: Take 1 tablet by mouth 2 (two) times daily. For 10 days    Dispense:  20 tablet    Refill:  0  . fluconazole (DIFLUCAN) 150 MG tablet    Sig: Take one tablet by mouth on Day 1. Repeat dose 2nd tablet on Day 3.    Dispense:  2 tablet    Refill:  0    Follow-up: - Return as needed  Patient verbalizes understanding with the above medical recommendations including the limitation of remote medical advice.  Specific follow-up and call-back criteria were given for  patient to follow-up or seek medical care more urgently if needed.   - Time spent in direct consultation with patient on phone: 7 minutes  Nobie Putnam, [facility] 11/09/2020, 8:11 AM

## 2020-11-09 NOTE — Patient Instructions (Addendum)
1. Start Augmentin 875-125mg  PO BID x 10 days - Diflucan just in case yeast infection 2. Continue nasal steroid Flonase 2 sprays in each nostril daily for 4-6 weeks, may repeat course seasonally or as needed 3. Continue Claritin 4. Add Singulair 10mg  nightly for allergies  Please schedule a Follow-up Appointment to: Return if symptoms worsen or fail to improve.  If you have any other questions or concerns, please feel free to call the office or send a message through [place]. You may also schedule an earlier appointment if necessary.  Additionally, you may be receiving a survey about your experience at our office within a few days to 1 week by e-mail or mail. We value your feedback.  Nobie Putnam, DO [place]

## 2020-12-14 ENCOUNTER — Telehealth: Payer: Self-pay

## 2020-12-14 DIAGNOSIS — R7309 Other abnormal glucose: Secondary | ICD-10-CM

## 2020-12-14 NOTE — Telephone Encounter (Signed)
Copied from [place] [id number]. Topic: General - Inquiry >> Dec 14, 2020  3:09 PM Oneta Rack wrote: Patient was last seen 11/09/2020 and would like a referral sent to [facility] Endocrinology. Patient states her insurance does not require a referral but the specialist is requesting a order be placed. Patient states PCP will know what the referral is for base off of last encounter. Please advise patient when referral is placed.

## 2020-12-14 NOTE — Telephone Encounter (Signed)
Last visit for routine physical 08/18/20 documentation in that note and lab results reviewed.  Orders Placed This Encounter  Procedures  . Ambulatory referral to Endocrinology    Referral Priority:   Routine    Referral Type:   Consultation    Referral Reason:   Specialty Services Required    Number of Visits Requested:   1   Referral to Endocrinology - [facility])  Abnormal weight gain, pre-diabetes, initial concern for PCOS has had some lab testing with TSH Thyroid panel normal, Insulin - 15.2, Testosterone 23. However patient would still like consultation with Endocrinology to review weight gain and if any other hormonal component  Nobie Putnam, [facility] 12/14/2020, 5:33 PM

## 2020-12-15 NOTE — Telephone Encounter (Signed)
I sent the patient a mychart message to notify her that the referral was placed.

## 2021-01-24 ENCOUNTER — Encounter: Payer: Self-pay | Admitting: Endocrinology

## 2021-01-24 ENCOUNTER — Other Ambulatory Visit: Payer: Self-pay

## 2021-01-24 ENCOUNTER — Ambulatory Visit: Payer: Managed Care, Other (non HMO) | Admitting: Endocrinology

## 2021-01-24 DIAGNOSIS — R739 Hyperglycemia, unspecified: Secondary | ICD-10-CM

## 2021-01-24 DIAGNOSIS — R635 Abnormal weight gain: Secondary | ICD-10-CM | POA: Diagnosis not present

## 2021-01-24 MED ORDER — DEXAMETHASONE 1 MG PO TABS: 1.0000 mg | ORAL_TABLET | ORAL | 0 refills | Status: DC

## 2021-01-24 NOTE — Patient Instructions (Signed)
First, please do a 24-HR urine collection.   After you are finished with the above, you should do a "dexamethasone suppression test."  For this, you would take dexamethasone 1 mg at 10 pm (I have sent a prescription to your pharmacy), then come in for a "cortisol" blood test the next morning before 9 am.  You do not need to be fasting for this test.

## 2021-01-24 NOTE — Progress Notes (Signed)
Subjective:    Patient ID: Kara Porter, female    DOB: 12-Jan-1961, 60 y.o.   MRN: [id number]  HPI Pt is referred by Dr Parks Ranger.  She has not recently taken any steroids.  She has no h/o cancer, pituitary disorder, cataracts, PUD, HTN, osteoporosis, or adrenal disorder.  She has never had pituitary or adrenal imaging.  She reports insomnia, depression, and fatigue.  She says cbg varies from 106-144.  She has chronic weight gain, but she has since re-lost 20 lbs.   Past Medical History:  Diagnosis Date  . Irritable bowel syndrome 2019    Past Surgical History:  Procedure Laterality Date  . [place]  . [place]  . HYSTEROSCOPY  2018    Social History   Socioeconomic History  . Marital status: Married    Spouse name: Not on file  . Number of children: 2  . Years of education: College  . Highest education level: Bachelor's degree (e.g., BA, AB, BS)  Occupational History  . Occupation: HR Manager    Comment: [employer]  Tobacco Use  . Smoking status: Former Smoker    Packs/day: 0.75    Years: 6.00    Pack years: 4.50    Types: Cigarettes    Quit date: 2018    Years since quitting: 4.2  . Smokeless tobacco: Former Network engineer  . Vaping Use: Never used  Substance and Sexual Activity  . Alcohol use: Not Currently  . Drug use: Never  . Sexual activity: Not Currently    Birth control/protection: None  Other Topics Concern  . Not on file  Social History Narrative  . Not on file   Social Determinants of Health   Financial Resource Strain: Not on file  Food Insecurity: Not on file  Transportation Needs: Not on file  Physical Activity: Not on file  Stress: Not on file  Social Connections: Not on file  Intimate Partner Violence: Not on file    Current Outpatient Medications on File Prior to Visit  Medication Sig Dispense Refill  . Albuterol Sulfate (PROAIR RESPICLICK) 510 (90 Base) MCG/ACT AEPB Inhale 2 puffs into the  lungs every 6 (six) hours as needed. 1 each 0  . hydrochlorothiazide (HYDRODIURIL) 25 MG tablet Take 1 tablet (25 mg total) by mouth daily. 90 tablet 3  . meloxicam (MOBIC) 15 MG tablet as needed.    . montelukast (SINGULAIR) 10 MG tablet Take 1 tablet (10 mg total) by mouth at bedtime. 90 tablet 1  . Multiple Vitamin (MULTIVITAMIN) LIQD Take 5 mLs by mouth daily.    Marland Kitchen triamcinolone cream (KENALOG) 0.1 % Apply 1 application topically 2 (two) times daily as needed. For 1-2 weeks. Do not use on face. 30 g 0  . Turmeric (QC TUMERIC COMPLEX PO) Take 1,000 mg by mouth.    . valACYclovir (VALTREX) 500 MG tablet Take 1 tablet (500 mg total) by mouth 3 (three) times daily as needed. PRN herpes flare 90 tablet 2  . fluconazole (DIFLUCAN) 150 MG tablet Take one tablet by mouth on Day 1. Repeat dose 2nd tablet on Day 3. 2 tablet 0  . ipratropium (ATROVENT) 0.06 % nasal spray Place 2 sprays into both nostrils 4 (four) times daily. For up to 5-7 days then stop. 15 mL 1  . polyethylene glycol (MIRALAX / GLYCOLAX) packet Take 17 g by mouth daily. (Patient not taking: Reported on 11/09/2020)    . psyllium (METAMUCIL) 58.6 %  packet Take 1 packet by mouth daily.     No current facility-administered medications on file prior to visit.    No Known Allergies  Family History  Problem Relation Age of Onset  . Hyperlipidemia Mother   . Heart disease Mother   . Cancer Father   . Hyperlipidemia Father   . Breast cancer Neg Hx   . Adrenal disorder Neg Hx     BP 126/80 (BP Location: Right Arm, Patient Position: Sitting, Cuff Size: Large)   Pulse 73   Ht 5\' 4"  (1.626 m)   Wt 231 lb (104.8 kg)   LMP 11/24/2017 (Exact Date)   SpO2 98%   BMI 39.65 kg/m    Review of Systems denies headache, easy bruising, depression, and rash on the abdomen.      Objective:   Physical Exam VS: see vs page GEN: no distress HEAD: head: no deformity.  Mild terminal hair on the face.   eyes: no periorbital swelling, no  proptosis external nose and ears are normal NECK: supple, thyroid is not enlarged CHEST WALL: no deformity.   LUNGS: clear to auscultation.  CV: reg rate and rhythm, no murmur.  MUSCULOSKELETAL: gait is normal and steady EXTEMITIES: no deformity.  no leg edema NEURO:  readily moves all 4's.  sensation is intact to touch on all 4's SKIN:  Normal texture and temperature.  No rash or suspicious lesion is visible.  No striae.   NODES:  None palpable at the neck PSYCH: alert, well-oriented.  Does not appear anxious nor depressed.  Lab Results  Component Value Date   TSH 1.33 08/18/2020    I have reviewed outside records, and summarized: Pt was noted to have elevated A1c, and referred here.  Sinusitis, weight gain, and vaginitis were also addressed.       Assessment & Plan:  Weight gain, new to me, uncertain etiology and prognosis.  We'll r/o cushing's.   Patient Instructions  First, please do a 24-HR urine collection.   After you are finished with the above, you should do a "dexamethasone suppression test."  For this, you would take dexamethasone 1 mg at 10 pm (I have sent a prescription to your pharmacy), then come in for a "cortisol" blood test the next morning before 9 am.  You do not need to be fasting for this test.

## 2021-01-27 ENCOUNTER — Other Ambulatory Visit (INDEPENDENT_AMBULATORY_CARE_PROVIDER_SITE_OTHER): Payer: Managed Care, Other (non HMO)

## 2021-01-27 ENCOUNTER — Other Ambulatory Visit: Payer: Self-pay

## 2021-01-27 DIAGNOSIS — R635 Abnormal weight gain: Secondary | ICD-10-CM

## 2021-02-01 LAB — CORTISOL, URINE, 24 HOUR
24 Hour urine volume (VMAHVA): 700 mL
CREATININE, URINE: 1.01 g/(24.h) (ref 0.50–2.15)
Cortisol (Ur), Free: 9.4 mcg/24 h (ref 4.0–50.0)

## 2021-02-02 ENCOUNTER — Other Ambulatory Visit: Payer: Self-pay

## 2021-02-02 ENCOUNTER — Other Ambulatory Visit (INDEPENDENT_AMBULATORY_CARE_PROVIDER_SITE_OTHER): Payer: Managed Care, Other (non HMO)

## 2021-02-02 DIAGNOSIS — R635 Abnormal weight gain: Secondary | ICD-10-CM | POA: Diagnosis not present

## 2021-02-02 DIAGNOSIS — R739 Hyperglycemia, unspecified: Secondary | ICD-10-CM

## 2021-02-02 LAB — CORTISOL: Cortisol, Plasma: 0 ug/dL

## 2021-02-02 LAB — HEMOGLOBIN A1C: Hgb A1c MFr Bld: 5.8 % (ref 4.6–6.5)

## 2021-02-03 ENCOUNTER — Encounter: Payer: Self-pay | Admitting: Endocrinology

## 2021-02-15 ENCOUNTER — Other Ambulatory Visit: Payer: Self-pay

## 2021-02-15 ENCOUNTER — Ambulatory Visit: Payer: Managed Care, Other (non HMO) | Admitting: Family Medicine

## 2021-02-15 ENCOUNTER — Encounter: Payer: Self-pay | Admitting: Family Medicine

## 2021-02-15 VITALS — BP 125/72 | HR 71 | Ht 64.0 in | Wt 230.6 lb

## 2021-02-15 DIAGNOSIS — E669 Obesity, unspecified: Secondary | ICD-10-CM | POA: Diagnosis not present

## 2021-02-15 DIAGNOSIS — R29818 Other symptoms and signs involving the nervous system: Secondary | ICD-10-CM | POA: Diagnosis not present

## 2021-02-15 DIAGNOSIS — G4733 Obstructive sleep apnea (adult) (pediatric): Secondary | ICD-10-CM | POA: Insufficient documentation

## 2021-02-15 DIAGNOSIS — G4719 Other hypersomnia: Secondary | ICD-10-CM

## 2021-02-15 NOTE — Patient Instructions (Addendum)
Thank you for coming to the office today.  Will refer to [place] - stay tuned for apt - 2 step process, home sleep study, then in lab if needed, and then may get the CPAP machine if indicated.  ------------------------------------------------------ Check into this before taking those medications  family history of medullary thyroid cancer or multiple endocrine neoplasia 2A or 2B  -------------------------------------------------------   Call insurance find cost and coverage of the following   1. Ozempic (Semaglutide injection) - start 0.25mg  weekly for 4 weeks then increase to 0.5mg  weekly   2. Bydureon BCise (Exenatide ER) - once weekly - this is my preference, very good medicine well tolerated, less side effects of nausea, upset stomach. No dose changes. Cost and coverage is the problem, but we may be able to get it with the coupon card   3. Trulicity (Dulaglutide) - once weekly - this is very good one, usually one of my top choices as well, two doses, 0.75 (likely we would start) and 1.5 max dose. We can use coupon card here too   4. Victoza (Liraglutide) - once DAILY - 3 dose changes 0.6, 1.2 and 1.8, side effects nausea, upset stomach higher on this one but it is still very effective medicine  WEIGHT LOSS ONLY VERSIONS  Ozempic = Wegovy (weekly)  Victoza = Saxenda (daily)   We can get these newer meds at low cost if you are interested.  3 benefits - 1 significantly reduced A1c sugar, and may be able to reduce or stop metformin in future - 2 reduced appetite and weight loss with good results - 3 cardiovascular risk reduction, less likely to have heart attack/stroke   Please schedule a Follow-up Appointment to: Return in about 3 months (around 05/18/2021) for 3 month follow-up Weight, Sleep Study.  If you have any other questions or concerns, please feel free to call the office or send a message through [place]. You may also schedule an earlier appointment  if necessary.  Additionally, you may be receiving a survey about your experience at our office within a few days to 1 week by e-mail or mail. We value your feedback.  Nobie Putnam, DO [place]

## 2021-02-15 NOTE — Progress Notes (Signed)
Subjective:    Patient ID: Kara Porter, female    DOB: February 01, 1961, 60 y.o.   MRN: [id number]  Kara Porter is a 60 y.o. female presenting on 02/15/2021 for Prediabetes and Obesity   HPI   Wellness /Pre-Diabetes/ Morbid Obesity BMI >39  CBGs:Not checking sugar Meds:Never on  Update from Dr Renato Shin [facility] 01/2021, he tested cortisol levels and A1c, with normal cortisol levels. Improved A1c. Limited steps next, considering medication.  She is closely watching her PO intake. She is keeping a Museum/gallery conservator. She was at lowest on 1200 calorie, then increased this since it was too low, now around 1400-1600. She is following online measurement / calorie tracking. - She is still limited with her back, for exercise, she is walking in moderation, doing more seated exercise, and working on avoiding things that strain her back - She is trying to overall make a Life Change, not just a specific diet. She has gradually improved, had a little less focus in April 2022. - She did a 21 day hormone reset diet and it was helpful. - Now taking Meloxicam / Ibuprofen PRN only. Limiting these.  She feels like motivation and energy levels are still low She is interested in trying to get more energy. Admits contribution from age 28 and menopause Takes her 30 min to get out of bed in AM She admits some restless sleep at times, not really waking up  Epworth Sleepiness Scale Total Score: 12 Sitting and reading - 2 Watching TV - 2 Sitting inactive in a public place - 2 As a passenger in a car for an hour without a break - 2 Lying down to rest in the afternoon when circumstances permit - 2 Sitting and talking to someone - 0 Sitting quietly after a lunch without alcohol - 1 In a car, while stopped for a few minutes in traffic - 1   STOP-Bang OSA scoring Snoring yes   Tiredness yes   Observed apneas no   Pressure HTN no   BMI > 35 kg/m2 yes   Age > 72  yes   Neck (female >17 in;  Female >16 in)  yes 34"  Gender female no   OSA risk low (0-2)  OSA risk intermediate (3-4)  OSA risk high (5+)  Total: 5 high risk      Additional topics today:  PMH -IBS(see prior notes for background), remains chronic problem. Improving diet. Former Smoker  Lumbar Spinal Stenosis - [facility] [place].  Health Maintenance:  Prior colonoscopy,[facility] - no record received, initial colonoscopy age 43,no polyps. Now completed Cologuard 11/17/19 - NEGATIVE, good for 3 years, next 2024.  F/u with OBGYN for Mammogram as scheduled. - Last pap 2019, next due with GYN 2022   Depression screen [facility] 2/9 02/15/2021 08/18/2020 12/10/2019  Decreased Interest 1 0 0  Down, Depressed, Hopeless 1 0 0  PHQ - 2 Score 2 0 0  Altered sleeping 1 - -  Tired, decreased energy 1 - -  Change in appetite 0 - -  Feeling bad or failure about yourself  0 - -  Trouble concentrating 0 - -  Moving slowly or fidgety/restless 0 - -  Suicidal thoughts 0 - -  PHQ-9 Score 4 - -  Difficult doing work/chores Somewhat difficult - -    Social History   Tobacco Use  . Smoking status: Former Smoker    Packs/day: 0.75    Years: 6.00    Pack years: 4.50  Types: Cigarettes    Quit date: 2018    Years since quitting: 4.3  . Smokeless tobacco: Former Network engineer  . Vaping Use: Never used  Substance Use Topics  . Alcohol use: Not Currently  . Drug use: Never    Review of Systems Per HPI unless specifically indicated above     Objective:    BP 125/72   Pulse 71   Ht 5\' 4"  (1.626 m)   Wt 230 lb 9 oz (104.6 kg)   LMP 11/24/2017 (Exact Date)   SpO2 97%   BMI 39.58 kg/m   Wt Readings from Last 3 Encounters:  02/15/21 230 lb 9 oz (104.6 kg)  01/24/21 231 lb (104.8 kg)  11/09/20 250 lb (113.4 kg)    Physical Exam Vitals and nursing note reviewed.  Constitutional:      General: She is not in acute distress.    Appearance: She is well-developed. She is obese. She is not  diaphoretic.     Comments: Well-appearing, comfortable, cooperative  HENT:     Head: Normocephalic and atraumatic.  Eyes:     General:        Right eye: No discharge.        Left eye: No discharge.     Conjunctiva/sclera: Conjunctivae normal.  Cardiovascular:     Rate and Rhythm: Normal rate.  Pulmonary:     Effort: Pulmonary effort is normal.  Skin:    General: Skin is warm and dry.     Findings: No erythema or rash.  Neurological:     Mental Status: She is alert and oriented to person, place, and time.  Psychiatric:        Behavior: Behavior normal.     Comments: Well groomed, good eye contact, normal speech and thoughts       Results for orders placed or performed in visit on 02/02/21  Hemoglobin A1c  Result Value Ref Range   Hgb A1c MFr Bld 5.8 4.6 - 6.5 %  Cortisol  Result Value Ref Range   Cortisol, Plasma 0.0 Repeated and verified X2. ug/dL      Assessment & Plan:   Problem List Items Addressed This Visit    Suspected sleep apnea   Obesity (BMI 30-39.9) - Primary   Excessive daytime sleepiness      New problem with persistent clinical concern for suspected obstructive sleep apnea given reported symptoms with snoring and sleep disturbance, fatigue excessive sleepiness. - Screening: ESS score 12 / STOP-Bang Score 5 high risk - Neck Circumference: 18" - Co-morbidities: Mood Disorder, obesity  Plan: 1. Discussion on initial diagnosis and testing for OSA, risk factors, management, complications 2. Agree to proceed with sleep study testing based on clinical concerns - will fax referral to [place] - to arrange initial PSG home vs in sleep lab and further evaluation.   #Obesity #PreDM 20 lb wt loss in past 3-4 months with good results Counseling on continued improvement with lifestyle, diet, exercise regimen Has completed Endocrine evaluation, no excess cortisol, other labs unremarkable Discussion today on GLP1 therapy as recommended next  step if interested to boost her current weight loss Will give handout info and counseling on med options, she can contact us in future if interested to explore, can do free sample of GLP1 injection if interested and we can order to pharmacy if they can cover it. May consider treating for PreDM or can cover for obesity.  No orders of the defined types were  placed in this encounter.     Follow up plan: Return in about 3 months (around 05/18/2021) for 3 month follow-up Weight, Sleep Study.   Nobie Putnam, [facility] 02/15/2021, 8:31 AM

## 2021-02-22 ENCOUNTER — Other Ambulatory Visit: Payer: Self-pay | Admitting: Family Medicine

## 2021-02-22 DIAGNOSIS — Z1231 Encounter for screening mammogram for malignant neoplasm of breast: Secondary | ICD-10-CM

## 2021-02-22 NOTE — Telephone Encounter (Signed)
Copied from [place] [id number]. Topic: General - Other >> Feb 22, 2021  2:21 PM Leward Quan A wrote: Reason for CRM: Patient called in to inform Dr Raliegh Ip that she have an appointment scheduled for a mammogram at [facility] for 03/09/21 and need a referral sent over please so that this can be completed. Any questions please call Ph# [phone]

## 2021-03-07 ENCOUNTER — Other Ambulatory Visit: Payer: Self-pay

## 2021-03-07 ENCOUNTER — Ambulatory Visit
Admission: RE | Admit: 2021-03-07 | Discharge: 2021-03-07 | Disposition: A | Discharge status: Home / Self Care | Payer: Managed Care, Other (non HMO) | Source: Ambulatory Visit | Attending: Family Medicine | Admitting: Family Medicine

## 2021-03-07 DIAGNOSIS — Z1231 Encounter for screening mammogram for malignant neoplasm of breast: Secondary | ICD-10-CM | POA: Insufficient documentation

## 2021-03-26 DIAGNOSIS — R7303 Prediabetes: Secondary | ICD-10-CM

## 2021-03-30 ENCOUNTER — Other Ambulatory Visit: Payer: Self-pay | Admitting: Family Medicine

## 2021-03-30 DIAGNOSIS — R7303 Prediabetes: Secondary | ICD-10-CM

## 2021-03-30 MED ORDER — OZEMPIC (0.25 OR 0.5 MG/DOSE) 2 MG/1.5ML ~~LOC~~ SOPN
0.2500 mg | PEN_INJECTOR | SUBCUTANEOUS | 1 refills | Status: DC
Start: 2021-03-30 — End: 2021-03-31

## 2021-03-30 NOTE — Telephone Encounter (Signed)
Requested medications are due for refill today.  yes  Requested medications are on the active medications list.  yes  Last refill. See note  Future visit scheduled.     Notes to clinic.  Pharmacy is asking if medication can be changed. Please advise.

## 2021-03-31 ENCOUNTER — Other Ambulatory Visit: Payer: Self-pay | Admitting: Family Medicine

## 2021-03-31 ENCOUNTER — Telehealth: Payer: Self-pay | Admitting: Family Medicine

## 2021-03-31 DIAGNOSIS — R7303 Prediabetes: Secondary | ICD-10-CM

## 2021-03-31 MED ORDER — OZEMPIC (0.25 OR 0.5 MG/DOSE) 2 MG/1.5ML ~~LOC~~ SOPN: 0.2500 mg | PEN_INJECTOR | SUBCUTANEOUS | 1 refills | Status: DC

## 2021-03-31 NOTE — Telephone Encounter (Signed)
Requested medication (s) are due for refill today: Yes  Requested medication (s) are on the active medication list: Yes  Last refill:  03/31/21  Future visit scheduled: Yes  Notes to clinic:  See highlighted notes from pharmacy, med not covered by insurance, sent alternative     Requested Prescriptions  Pending Prescriptions Disp Refills   BYDUREON 2 MG PEN [Pharmacy Med Name: BYDUREON 2 MG PEN INJECT]  0      Endocrinology:  Diabetes - GLP-1 Receptor Agonists - exenatide Passed - 03/31/2021  4:41 PM      Passed - HBA1C is between 0 and 7.9 and within 180 days    Hgb A1c MFr Bld  Date Value Ref Range Status  02/02/2021 5.8 4.6 - 6.5 % Final    Comment:    Glycemic Control Guidelines for People with Diabetes:Non Diabetic:  <6%Goal of Therapy: <7%Additional Action Suggested:  >8%           Passed - Cr in normal range and within 360 days    Creat  Date Value Ref Range Status  08/18/2020 0.58 0.50 - 1.05 mg/dL Final    Comment:    For patients >10 years of age, the reference limit for Creatinine is approximately 13% higher for people identified as African-American. .           Passed - AA eGFR in normal range and within 360 days    GFR, Est African American  Date Value Ref Range Status  08/18/2020 119 > OR = 60 mL/min/1.80m Final   GFR, Est Non African American  Date Value Ref Range Status  08/18/2020 102 > OR = 60 mL/min/1.740mFinal          Passed - Valid encounter within last 6 months    Recent Outpatient Visits           1 month ago Obesity (BMI 30-39.9)   [facility]   4 months ago Acute non-recurrent frontal sinusitis   [facility]   7 months ago Annual physical exam   [facility]   1 year ago Obesity (BMI 35.0-39.9 without comorbidity)   [facility]   1 year ago Annual  physical exam   [facility]       Future Appointments             In 1 month [facility]onne Doughty[facility]

## 2021-03-31 NOTE — Telephone Encounter (Signed)
Initially ordered Ozempic. Today received denial that it was not preferred, and to switch to preferred agent or do PA.  I switched to one of the preferred listed meds from pharmacy - Trulicity today, sent new rx however I received the exact same message, it lists Bydureon as only option.  At this point, I would request that we submit a PA for Ozempic the original rx again and see if we can get it covered by PA.  ____________________________________________________ Additional Rx Information (May be used for Prior Authorization if required)  Medication name and Strength: Ozempic 0.25 to 0.5 mg  Primary Diagnosis and ICD10 Code: Morbid Obesity (E66.01) Secondary Diagnosis and ICD10 Code: Pre-Diabetes (R73.03) Previous Failed Medications (Duration or Start Date) None  Quantity and Duration of New Medication: 4.5 mL for 90 days Additional Supporting Information: Lab result 5.8 to 6.1 A1c Weight 230 lbs, Height 5\' 4" , BMI 39.58 ____________________________________________________  Nobie Putnam, DO [facility] 03/31/2021, 4:20 PM

## 2021-03-31 NOTE — Telephone Encounter (Signed)
  Notes to clinic:  Alternative Requested:THE PRESCRIBED MEDICATION IS NOT COVERED BY INSURANCE. PLEASE CONSIDER CHANGING TO ONE OF THE SUGGESTED COVERED ALTERNATIVES OR SUBMITTING FOR A PRIOR AUTHORIZATION.  Requested Prescriptions  Pending Prescriptions Disp Refills   BYDUREON 2 MG PEN [Pharmacy Med Name: BYDUREON 2 MG PEN INJECT]  0      Endocrinology:  Diabetes - GLP-1 Receptor Agonists - exenatide Passed - 03/31/2021  2:13 PM      Passed - HBA1C is between 0 and 7.9 and within 180 days    Hgb A1c MFr Bld  Date Value Ref Range Status  02/02/2021 5.8 4.6 - 6.5 % Final    Comment:    Glycemic Control Guidelines for People with Diabetes:Non Diabetic:  <6%Goal of Therapy: <7%Additional Action Suggested:  >8%           Passed - Cr in normal range and within 360 days    Creat  Date Value Ref Range Status  08/18/2020 0.58 0.50 - 1.05 mg/dL Final    Comment:    For patients >4 years of age, the reference limit for Creatinine is approximately 13% higher for people identified as African-American. .           Passed - AA eGFR in normal range and within 360 days    GFR, Est African American  Date Value Ref Range Status  08/18/2020 119 > OR = 60 mL/min/1.64m2 Final   GFR, Est Non African American  Date Value Ref Range Status  08/18/2020 102 > OR = 60 mL/min/1.11m2 Final          Passed - Valid encounter within last 6 months    Recent Outpatient Visits           1 month ago Obesity (BMI 30-39.9)   [facility] [place], Devonne Doughty, DO   4 months ago Acute non-recurrent frontal sinusitis   [place], DO   7 months ago Annual physical exam   [facility] [place], DO   1 year ago Obesity (BMI 35.0-39.9 without comorbidity)   [facility] Olin Hauser, DO   1 year ago Annual physical exam   [facility] Olin Hauser, DO        Future Appointments             In 1 month [place], Devonne Doughty, DO [facility], [facility]

## 2021-04-01 NOTE — Telephone Encounter (Signed)
I have completed a PA and it is in process with [employer].

## 2021-04-13 MED ORDER — METFORMIN HCL 500 MG PO TABS: 500.0000 mg | ORAL_TABLET | Freq: Two times a day (BID) | ORAL | 0 refills | Status: DC

## 2021-04-13 NOTE — Addendum Note (Signed)
Addended by: Olin Hauser on: 04/13/2021 05:30 PM   Modules accepted: Orders

## 2021-05-04 ENCOUNTER — Other Ambulatory Visit: Payer: Self-pay | Admitting: Family Medicine

## 2021-05-04 DIAGNOSIS — R7303 Prediabetes: Secondary | ICD-10-CM

## 2021-05-04 MED ORDER — OZEMPIC (0.25 OR 0.5 MG/DOSE) 2 MG/1.5ML ~~LOC~~ SOPN: 0.2500 mg | PEN_INJECTOR | SUBCUTANEOUS | 1 refills | Status: DC

## 2021-05-04 NOTE — Telephone Encounter (Signed)
Requested medications are due for refill today.  yes  Requested medications are on the active medications list.  yes  Last refill. nonr  Future visit scheduled.   yes  Notes to clinic.  Pt requesting alternate medication as this one is not covered by insurance.

## 2021-05-05 ENCOUNTER — Telehealth: Payer: Self-pay | Admitting: Family Medicine

## 2021-05-05 ENCOUNTER — Other Ambulatory Visit: Payer: Self-pay | Admitting: Family Medicine

## 2021-05-05 DIAGNOSIS — R7303 Prediabetes: Secondary | ICD-10-CM

## 2021-05-05 NOTE — Telephone Encounter (Signed)
Can you submit a Prior Authorization for her Ozempic?  We have tried to submit this to pharmacy multiple times in past 4-6 weeks. They auto reject it and say to switch to alternative. We have switched to alternative Trulicity, Bydureon. None of them are covered, same response from each one.  The denial letter we received from first time, said she needed to try Metformin first.  I have since already ordered Metformin on 04/13/21.  Now would like to resubmit PA for Ozempic.  I sent rx to pharmacy and they sent the autodecline again saying need to switch again but we know that will not work. It does say PA may be required.  Thanks!  ____________________________________________________ Additional Rx Information (May be used for Prior Authorization if required)  Medication name and Strength: Ozempic Pen 0.25 / 0.5 mg  Primary Diagnosis and ICD10 Code: Pre-Diabetes (R73.03) Secondary Diagnosis and ICD10 Code: Morbid Obesity (E66.01) Previous Failed Medications (Duration or Start Date) Metformin (04/13/21) Quantity and Duration of New Medication: 4.5 mL for 84 day supply Additional Supporting Information: Lab result A1c 5.8 to 6.1 Weight 230 lbs, Ht 5\' 4" , BMI >39 ___________________________________________  Nobie Putnam, DO [facility] 05/05/2021, 12:41 PM

## 2021-05-05 NOTE — Telephone Encounter (Signed)
  Notes to clinic:  Alternative Requested:THE PRESCRIBED MEDICATION IS NOT COVERED BY INSURANCE. PLEASE CONSIDER CHANGING TO ONE OF THE SUGGESTED COVERED ALTERNATIVES OR SUBMITTING FOR A PRIOR AUTHORIZATION   Requested Prescriptions  Pending Prescriptions Disp Refills   BYDUREON 2 MG PEN [Pharmacy Med Name: BYDUREON 2 MG PEN INJECT]  0      Endocrinology:  Diabetes - GLP-1 Receptor Agonists - exenatide Passed - 05/05/2021 12:43 PM      Passed - HBA1C is between 0 and 7.9 and within 180 days    Hgb A1c MFr Bld  Date Value Ref Range Status  02/02/2021 5.8 4.6 - 6.5 % Final    Comment:    Glycemic Control Guidelines for People with Diabetes:Non Diabetic:  <6%Goal of Therapy: <7%Additional Action Suggested:  >8%           Passed - Cr in normal range and within 360 days    Creat  Date Value Ref Range Status  08/18/2020 0.58 0.50 - 1.05 mg/dL Final    Comment:    For patients >63 years of age, the reference limit for Creatinine is approximately 13% higher for people identified as African-American. .           Passed - AA eGFR in normal range and within 360 days    GFR, Est African American  Date Value Ref Range Status  08/18/2020 119 > OR = 60 mL/min/1.21m2 Final   GFR, Est Non African American  Date Value Ref Range Status  08/18/2020 102 > OR = 60 mL/min/1.49m2 Final          Passed - Valid encounter within last 6 months    Recent Outpatient Visits           2 months ago Obesity (BMI 30-39.9)   [facility], Devonne Doughty, DO   5 months ago Acute non-recurrent frontal sinusitis   [place], DO   8 months ago Annual physical exam   [facility] [place], Devonne Doughty, DO   1 year ago Obesity (BMI 35.0-39.9 without comorbidity)   [facility] Olin Hauser, DO   1 year ago Annual physical exam   [facility] Olin Hauser, DO        Future Appointments             In 2 weeks [place], Devonne Doughty, DO [facility], [facility]

## 2021-05-05 NOTE — Telephone Encounter (Signed)
I have submitted a PA for Ozempic to [employer]. Will await decision.

## 2021-05-05 NOTE — Telephone Encounter (Signed)
I believe this is your pt

## 2021-05-23 ENCOUNTER — Other Ambulatory Visit: Payer: Self-pay

## 2021-05-23 ENCOUNTER — Other Ambulatory Visit: Payer: Self-pay | Admitting: Family Medicine

## 2021-05-23 ENCOUNTER — Encounter: Payer: Self-pay | Admitting: Family Medicine

## 2021-05-23 ENCOUNTER — Ambulatory Visit: Payer: Managed Care, Other (non HMO) | Admitting: Family Medicine

## 2021-05-23 VITALS — BP 116/62 | HR 62 | Ht 64.0 in | Wt 217.2 lb

## 2021-05-23 DIAGNOSIS — E559 Vitamin D deficiency, unspecified: Secondary | ICD-10-CM | POA: Diagnosis not present

## 2021-05-23 DIAGNOSIS — M48061 Spinal stenosis, lumbar region without neurogenic claudication: Secondary | ICD-10-CM

## 2021-05-23 DIAGNOSIS — R7303 Prediabetes: Secondary | ICD-10-CM

## 2021-05-23 DIAGNOSIS — Z0000 Encounter for general adult medical examination without abnormal findings: Secondary | ICD-10-CM

## 2021-05-23 DIAGNOSIS — B009 Herpesviral infection, unspecified: Secondary | ICD-10-CM

## 2021-05-23 DIAGNOSIS — E7800 Pure hypercholesterolemia, unspecified: Secondary | ICD-10-CM

## 2021-05-23 DIAGNOSIS — R7309 Other abnormal glucose: Secondary | ICD-10-CM

## 2021-05-23 DIAGNOSIS — B372 Candidiasis of skin and nail: Secondary | ICD-10-CM

## 2021-05-23 MED ORDER — VALACYCLOVIR HCL 500 MG PO TABS: 500.0000 mg | ORAL_TABLET | Freq: Three times a day (TID) | ORAL | 3 refills | Status: DC | PRN

## 2021-05-23 MED ORDER — CLOTRIMAZOLE-BETAMETHASONE 1-0.05 % EX CREA: TOPICAL_CREAM | CUTANEOUS | 1 refills | Status: AC

## 2021-05-23 MED ORDER — MELOXICAM 15 MG PO TABS
15.0000 mg | ORAL_TABLET | ORAL | 3 refills | Status: DC | PRN
Start: 2021-05-23 — End: 2022-06-06

## 2021-05-23 NOTE — Patient Instructions (Addendum)
Thank you for coming to the office today.  Try the Metformin '500mg'$  once daily with meal for 1 week then can adjust dose to 1 pill twice day with meal.  Wegovy - WEEKLY higher dose of Ozempic, reconsider in future when available.   Saxenda - DAILY (other version of Victoza)  Refills sent.  Labs today   DUE for FASTING BLOOD WORK (no food or drink after midnight before the lab appointment, only water or coffee without cream/sugar on the morning of)  SCHEDULE "Lab Only" visit in the morning at the clinic for lab draw in 3 MONTHS   - Make sure Lab Only appointment is at about 1 week before your next appointment, so that results will be available  For Lab Results, once available within 2-3 days of blood draw, you can can log in to MyChart online to view your results and a brief explanation. Also, we can discuss results at next follow-up visit.   Please schedule a Follow-up Appointment to: Return in about 3 months (around 08/23/2021) for 3 month fasting lab only then 1 week later Annual Physical.  If you have any other questions or concerns, please feel free to call the office or send a message through [place]. You may also schedule an earlier appointment if necessary.  Additionally, you may be receiving a survey about your experience at our office within a few days to 1 week by e-mail or mail. We value your feedback.  Nobie Putnam, DO [place]

## 2021-05-23 NOTE — Assessment & Plan Note (Signed)
Weight improved Consider GLP1 Saxenda or Wegovy in future

## 2021-05-23 NOTE — Progress Notes (Signed)
Subjective:    Patient ID: Kara Porter, female    DOB: [birth year], 60 y.o.   MRN: [id number]  Kara Porter is a 60 y.o. female presenting on 05/23/2021 for Weight Check   HPI  Wellness / Pre-Diabetes / Morbid Obesity BMI >37  CBGs: Not checking sugar Meds: Trial of Ozempic now was on samples. Now insurance not covering for PreDM. Weight loss 13 lbs in 3 months Has Metformin IR '500mg'$  for PreDm Now taking Meloxicam / Ibuprofen PRN only. Limiting these.  Vit D Deficiency Due for repeat Vitamin D level   Depression screen [facility] 2/9 02/15/2021 08/18/2020 12/10/2019  Decreased Interest 1 0 0  Down, Depressed, Hopeless 1 0 0  PHQ - 2 Score 2 0 0  Altered sleeping 1 - -  Tired, decreased energy 1 - -  Change in appetite 0 - -  Feeling bad or failure about yourself  0 - -  Trouble concentrating 0 - -  Moving slowly or fidgety/restless 0 - -  Suicidal thoughts 0 - -  PHQ-9 Score 4 - -  Difficult doing work/chores Somewhat difficult - -    Social History   Tobacco Use   Smoking status: Former    Packs/day: 0.75    Years: 6.00    Pack years: 4.50    Types: Cigarettes    Quit date: 2018    Years since quitting: 4.6   Smokeless tobacco: Former  Scientific laboratory technician Use: Never used  Substance Use Topics   Alcohol use: Not Currently   Drug use: Never    Review of Systems Per HPI unless specifically indicated above     Objective:    BP 116/62   Pulse 62   Ht '5\' 4"'$  (1.626 m)   Wt 217 lb 3.2 oz (98.5 kg)   LMP 11/24/2017 (Exact Date)   SpO2 97%   BMI 37.28 kg/m   Wt Readings from Last 3 Encounters:  05/23/21 217 lb 3.2 oz (98.5 kg)  02/15/21 230 lb 9 oz (104.6 kg)  01/24/21 231 lb (104.8 kg)    Physical Exam Vitals and nursing note reviewed.  Constitutional:      General: She is not in acute distress.    Appearance: Normal appearance. She is well-developed. She is obese. She is not diaphoretic.     Comments: Well-appearing, comfortable, cooperative  HENT:      Head: Normocephalic and atraumatic.  Eyes:     General:        Right eye: No discharge.        Left eye: No discharge.     Conjunctiva/sclera: Conjunctivae normal.  Neck:     Thyroid: No thyromegaly.  Cardiovascular:     Rate and Rhythm: Normal rate and regular rhythm.     Heart sounds: Normal heart sounds. No murmur heard. Pulmonary:     Effort: Pulmonary effort is normal. No respiratory distress.     Breath sounds: Normal breath sounds. No wheezing or rales.  Musculoskeletal:        General: Normal range of motion.     Cervical back: Normal range of motion and neck supple.  Lymphadenopathy:     Cervical: No cervical adenopathy.  Skin:    General: Skin is warm and dry.     Findings: No erythema or rash.  Neurological:     Mental Status: She is alert and oriented to person, place, and time.  Psychiatric:        Mood and Affect:  Mood normal.        Behavior: Behavior normal.        Thought Content: Thought content normal.     Comments: Well groomed, good eye contact, normal speech and thoughts     Results for orders placed or performed in visit on 02/02/21  Hemoglobin A1c  Result Value Ref Range   Hgb A1c MFr Bld 5.8 4.6 - 6.5 %  Cortisol  Result Value Ref Range   Cortisol, Plasma 0.0 Repeated and verified X2. ug/dL      Assessment & Plan:   Problem List Items Addressed This Visit     Pre-diabetes - Primary    A1c 5.8 recently, due for lab now Concern with obesity, HLD  Plan:  1. Not on any therapy currently  2. Encourage improved lifestyle - low carb, low sugar diet, reduce portion size, continue improving regular exercise  Start Metformin IR '500mg'$  daily then titrate up to BID if tolerated. Unable to get Ozempic covered for PreDM      Relevant Orders   Hemoglobin A1c   Morbid obesity ([place])    Weight improved Consider GLP1 Saxenda or Wegovy in future       Other Visit Diagnoses     Vitamin D deficiency       Relevant Orders   VITAMIN D 25 Hydroxy  (Vit-D Deficiency, Fractures)       No orders of the defined types were placed in this encounter.    Follow up plan: Return in about 3 months (around 08/23/2021) for 3 month fasting lab only then 1 week later Annual Physical.  Future labs ordered for 08/2021   Nobie Porter, [facility] 05/23/2021, 8:29 AM

## 2021-05-23 NOTE — Assessment & Plan Note (Signed)
A1c 5.8 recently, due for lab now Concern with obesity, HLD  Plan:  1. Not on any therapy currently  2. Encourage improved lifestyle - low carb, low sugar diet, reduce portion size, continue improving regular exercise  Start Metformin IR '500mg'$  daily then titrate up to BID if tolerated. Unable to get Ozempic covered for PreDM

## 2021-05-24 LAB — HEMOGLOBIN A1C
Hgb A1c MFr Bld: 5.3 % of total Hgb (ref <5.7)
Mean Plasma Glucose: 105 mg/dL
eAG (mmol/L): 5.8 mmol/L

## 2021-05-24 LAB — VITAMIN D 25 HYDROXY (VIT D DEFICIENCY, FRACTURES): Vit D, 25-Hydroxy: 44 ng/mL (ref 30–100)

## 2021-05-25 ENCOUNTER — Encounter: Payer: Self-pay | Admitting: Family Medicine

## 2021-05-25 MED ORDER — WEGOVY 1.7 MG/0.75ML ~~LOC~~ SOAJ: 1.7000 mg | SUBCUTANEOUS | 2 refills | Status: DC

## 2021-07-10 ENCOUNTER — Other Ambulatory Visit: Payer: Self-pay | Admitting: Family Medicine

## 2021-07-10 DIAGNOSIS — R7303 Prediabetes: Secondary | ICD-10-CM

## 2021-08-04 LAB — HM PAP SMEAR: HM Pap smear: NEGATIVE

## 2021-08-24 ENCOUNTER — Other Ambulatory Visit: Payer: Self-pay

## 2021-08-24 ENCOUNTER — Other Ambulatory Visit: Payer: Self-pay | Admitting: Family Medicine

## 2021-08-24 DIAGNOSIS — R7309 Other abnormal glucose: Secondary | ICD-10-CM

## 2021-08-24 DIAGNOSIS — R7303 Prediabetes: Secondary | ICD-10-CM

## 2021-08-24 DIAGNOSIS — E7800 Pure hypercholesterolemia, unspecified: Secondary | ICD-10-CM

## 2021-08-24 DIAGNOSIS — Z0000 Encounter for general adult medical examination without abnormal findings: Secondary | ICD-10-CM

## 2021-08-24 DIAGNOSIS — R609 Edema, unspecified: Secondary | ICD-10-CM

## 2021-08-24 NOTE — Telephone Encounter (Signed)
Requested medication (s) are due for refill today:   Yes  Requested medication (s) are on the active medication list:   Yes  Future visit scheduled:   Yes 08/30/2021   Last ordered: 08/18/2020 #90, 3 refills  Returned because protocol failed, labs due   Requested Prescriptions  Pending Prescriptions Disp Refills   hydrochlorothiazide (HYDRODIURIL) 25 MG tablet [Pharmacy Med Name: HYDROCHLOROTHIAZIDE 25 MG TAB] 90 tablet 3    Sig: TAKE 1 TABLET BY MOUTH EVERY DAY     Cardiovascular: Diuretics - Thiazide Failed - 08/24/2021  3:05 AM      Failed - Ca in normal range and within 360 days    Calcium  Date Value Ref Range Status  08/18/2020 9.4 8.6 - 10.4 mg/dL Final          Failed - Cr in normal range and within 360 days    Creat  Date Value Ref Range Status  08/18/2020 0.58 0.50 - 1.05 mg/dL Final    Comment:    For patients >26 years of age, the reference limit for Creatinine is approximately 13% higher for people identified as African-American. .           Failed - K in normal range and within 360 days    Potassium  Date Value Ref Range Status  08/18/2020 3.9 3.5 - 5.3 mmol/L Final          Failed - Na in normal range and within 360 days    Sodium  Date Value Ref Range Status  08/18/2020 140 135 - 146 mmol/L Final          Passed - Last BP in normal range    BP Readings from Last 1 Encounters:  05/23/21 116/62          Passed - Valid encounter within last 6 months    Recent Outpatient Visits           3 months ago Pre-diabetes   [place], Devonne Doughty, DO   6 months ago Obesity (BMI 30-39.9)   [facility] Olin Hauser, DO   9 months ago Acute non-recurrent frontal sinusitis   [facility] [place], Devonne Doughty, DO   1 year ago Annual physical exam   [facility] Olin Hauser, DO   1 year ago Obesity (BMI 35.0-39.9 without comorbidity)   [place], DO       Future Appointments             In 6 days [place], Devonne Doughty, DO [facility]

## 2021-08-25 ENCOUNTER — Other Ambulatory Visit: Payer: Self-pay

## 2021-08-25 ENCOUNTER — Other Ambulatory Visit: Payer: Managed Care, Other (non HMO)

## 2021-08-26 LAB — LIPID PANEL
Cholesterol: 220 mg/dL — ABNORMAL HIGH (ref <200)
HDL: 55 mg/dL (ref >=50)
LDL Cholesterol (Calc): 140 mg/dL (calc) — ABNORMAL HIGH
Non-HDL Cholesterol (Calc): 165 mg/dL (calc) — ABNORMAL HIGH (ref <130)
Total CHOL/HDL Ratio: 4 (calc) (ref <5.0)
Triglycerides: 128 mg/dL (ref <150)

## 2021-08-26 LAB — CBC WITH DIFFERENTIAL/PLATELET
Absolute Monocytes: 411 cells/uL (ref 200–950)
Basophils Absolute: 24 cells/uL (ref 0–200)
Basophils Relative: 0.3 %
Eosinophils Absolute: 47 cells/uL (ref 15–500)
Eosinophils Relative: 0.6 %
HCT: 38.4 % (ref 35.0–45.0)
Hemoglobin: 12.7 g/dL (ref 11.7–15.5)
Lymphs Abs: 2607 cells/uL (ref 850–3900)
MCH: 30.8 pg (ref 27.0–33.0)
MCHC: 33.1 g/dL (ref 32.0–36.0)
MCV: 93.2 fL (ref 80.0–100.0)
MPV: 9.5 fL (ref 7.5–12.5)
Monocytes Relative: 5.2 %
Neutro Abs: 4811 cells/uL (ref 1500–7800)
Neutrophils Relative %: 60.9 %
Platelets: 351 10*3/uL (ref 140–400)
RBC: 4.12 10*6/uL (ref 3.80–5.10)
RDW: 12.4 % (ref 11.0–15.0)
Total Lymphocyte: 33 %
WBC: 7.9 10*3/uL (ref 3.8–10.8)

## 2021-08-26 LAB — COMPLETE METABOLIC PANEL WITH GFR
AG Ratio: 1.5 (calc) (ref 1.0–2.5)
ALT: 15 U/L (ref 6–29)
AST: 15 U/L (ref 10–35)
Albumin: 4.3 g/dL (ref 3.6–5.1)
Alkaline phosphatase (APISO): 95 U/L (ref 37–153)
BUN: 13 mg/dL (ref 7–25)
CO2: 24 mmol/L (ref 20–32)
Calcium: 9.4 mg/dL (ref 8.6–10.4)
Chloride: 105 mmol/L (ref 98–110)
Creat: 0.56 mg/dL (ref 0.50–1.03)
Globulin: 2.9 g/dL (calc) (ref 1.9–3.7)
Glucose, Bld: 107 mg/dL — ABNORMAL HIGH (ref 65–99)
Potassium: 4.2 mmol/L (ref 3.5–5.3)
Sodium: 141 mmol/L (ref 135–146)
Total Bilirubin: 0.4 mg/dL (ref 0.2–1.2)
Total Protein: 7.2 g/dL (ref 6.1–8.1)
eGFR: 106 mL/min/{1.73_m2} (ref >=60)

## 2021-08-26 LAB — TSH: TSH: 1.18 mIU/L (ref 0.40–4.50)

## 2021-08-26 LAB — HEMOGLOBIN A1C
Hgb A1c MFr Bld: 5.7 % of total Hgb — ABNORMAL HIGH (ref <5.7)
Mean Plasma Glucose: 117 mg/dL
eAG (mmol/L): 6.5 mmol/L

## 2021-08-30 ENCOUNTER — Other Ambulatory Visit: Payer: Self-pay

## 2021-08-30 ENCOUNTER — Encounter: Payer: Self-pay | Admitting: Family Medicine

## 2021-08-30 ENCOUNTER — Ambulatory Visit (INDEPENDENT_AMBULATORY_CARE_PROVIDER_SITE_OTHER): Payer: Managed Care, Other (non HMO) | Admitting: Family Medicine

## 2021-08-30 ENCOUNTER — Other Ambulatory Visit: Payer: Self-pay | Admitting: Family Medicine

## 2021-08-30 ENCOUNTER — Encounter: Payer: Managed Care, Other (non HMO) | Admitting: Family Medicine

## 2021-08-30 VITALS — BP 127/69 | HR 60 | Ht 64.0 in | Wt 213.2 lb

## 2021-08-30 DIAGNOSIS — R7303 Prediabetes: Secondary | ICD-10-CM | POA: Diagnosis not present

## 2021-08-30 DIAGNOSIS — E7800 Pure hypercholesterolemia, unspecified: Secondary | ICD-10-CM

## 2021-08-30 DIAGNOSIS — Z0000 Encounter for general adult medical examination without abnormal findings: Secondary | ICD-10-CM | POA: Diagnosis not present

## 2021-08-30 NOTE — Progress Notes (Signed)
Subjective:    Patient ID: Kara Porter, female    DOB: 04-Apr-1961, 60 y.o.   MRN: [id number]  Kara Porter is a 60 y.o. female presenting on 08/30/2021 for Annual Exam   HPI  Here for Annual Physical and Lab Review.  Wellness / Pre-Diabetes / Morbid Obesity BMI >36 CBGs: Not checking sugar Meds: Completed trial on Wegovy (also tried Ozempic sample) Weight loss 4 lbs in 3 months Has Metformin IR 500mg  for PreDm Now taking Meloxicam / Ibuprofen PRN only. Limiting these.   Vit D Deficiency Taking VIt D3, asking about repeat test in future   Wellness / Pre-Diabetes / Morbid Obesity BMI >36 Last lab A1c 5.7, slight increase but overlal improved. Admits wt gain with lifestyle limitation with back pain. CBGs: Not checking sugar Meds: Never on   HYPERLIPIDEMIA: - Reports no concerns. Last lipid 2022, elevated LDL 140 stable to improved - Not on cholesterol medicine   Thrombocytosis, elevated platelets - resolved  She has started following with Chiropractor and has had major improvement with inflammation, constipation, and improved w/ accupuncture  History of suspected sleep apnea - last eval 02/2021 but she could not afford the higher deductible cost of sleep study.      PMH - IBS (see prior notes for background), remains chronic problem. Improving diet. Former Smoker   Lumbar Spinal Stenosis - Followed by Orthopedics, [place].    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   Health Maintenance:   [place] up to date. She has had 1 booster in April. Not yet,      Due Flu / TDap - declines today   Prior colonoscopy, [facility] - no record received, initial colonoscopy age 9, no polyps. Now completed Cologuard 11/17/19 - NEGATIVE, good for 3 years, next 2024. Consider Colonoscopy repeat if need.   F/u with OBGYN for Mammogram - done 02/2021, can do 1-2 years per.  Updated last pap smear 08/04/21 [facility] - negative results including HPV, see results  below   Depression screen [facility] 2/9 08/30/2021 02/15/2021 08/18/2020  Decreased Interest 1 1 0  Down, Depressed, Hopeless 0 1 0  PHQ - 2 Score 1 2 0  Altered sleeping 0 1 -  Tired, decreased energy 1 1 -  Change in appetite 0 0 -  Feeling bad or failure about yourself  0 0 -  Trouble concentrating 0 0 -  Moving slowly or fidgety/restless 0 0 -  Suicidal thoughts 0 0 -  PHQ-9 Score 2 4 -  Difficult doing work/chores Not difficult at all Somewhat difficult -    Past Medical History:  Diagnosis Date   Irritable bowel syndrome 2019   Past Surgical History:  Procedure Laterality Date   CESAREAN SECTION  1991 and [place]   HYSTEROSCOPY  2018   Social History   Socioeconomic History   Marital status: Married    Spouse name: Not on file   Number of children: 2   Years of education: College   Highest education level: Bachelor's degree (e.g., BA, AB, BS)  Occupational History   Occupation: HR Manager    Comment: [employer]  Tobacco Use   Smoking status: Former    Packs/day: 0.75    Years: 6.00    Pack years: 4.50    Types: Cigarettes    Quit date: 2018    Years since quitting: 4.8   Smokeless tobacco: Former  Media planner  Vaping Use: Never used  Substance and Sexual Activity   Alcohol use: Not Currently   Drug use: Never   Sexual activity: Not Currently    Birth control/protection: None  Other Topics Concern   Not on file  Social History Narrative   Not on file   Social Determinants of Health   Financial Resource Strain: Not on file  Food Insecurity: Not on file  Transportation Needs: Not on file  Physical Activity: Not on file  Stress: Not on file  Social Connections: Not on file  Intimate Partner Violence: Not on file   Family History  Problem Relation Age of Onset   Hyperlipidemia Mother    Heart disease Mother    Cancer Father    Hyperlipidemia Father    Breast cancer Neg Hx    Adrenal disorder Neg Hx    Current Outpatient  Medications on File Prior to Visit  Medication Sig   Albuterol Sulfate (PROAIR RESPICLICK) 144 (90 Base) MCG/ACT AEPB Inhale 2 puffs into the lungs every 6 (six) hours as needed.   clotrimazole-betamethasone (LOTRISONE) cream Apply 1-2 times a day for worsening flare intertrigo, may re-use daily up to 1 week as needed.   hydrochlorothiazide (HYDRODIURIL) 25 MG tablet TAKE 1 TABLET BY MOUTH EVERY DAY   meloxicam (MOBIC) 15 MG tablet Take 1 tablet (15 mg total) by mouth as needed.   metFORMIN (GLUCOPHAGE) 500 MG tablet TAKE 1 TABLET BY MOUTH 2 TIMES DAILY WITH A MEAL.   montelukast (SINGULAIR) 10 MG tablet Take 1 tablet (10 mg total) by mouth at bedtime.   Multiple Vitamin (MULTIVITAMIN) LIQD Take 5 mLs by mouth daily.   triamcinolone cream (KENALOG) 0.1 % Apply 1 application topically 2 (two) times daily as needed. For 1-2 weeks. Do not use on face.   Turmeric (QC TUMERIC COMPLEX PO) Take 1,000 mg by mouth.   valACYclovir (VALTREX) 500 MG tablet Take 1 tablet (500 mg total) by mouth 3 (three) times daily as needed. PRN herpes flare   No current facility-administered medications on file prior to visit.    Review of Systems  Constitutional:  Negative for activity change, appetite change, chills, diaphoresis, fatigue and fever.  HENT:  Negative for congestion and hearing loss.   Eyes:  Negative for visual disturbance.  Respiratory:  Negative for cough, chest tightness, shortness of breath and wheezing.   Cardiovascular:  Negative for chest pain, palpitations and leg swelling.  Gastrointestinal:  Negative for abdominal pain, constipation, diarrhea, nausea and vomiting.  Genitourinary:  Negative for dysuria, frequency and hematuria.  Musculoskeletal:  Negative for arthralgias and neck pain.  Skin:  Negative for rash.  Allergic/Immunologic: Negative for environmental allergies.  Neurological:  Negative for dizziness, weakness, light-headedness, numbness and headaches.  Hematological:  Negative  for adenopathy.  Psychiatric/Behavioral:  Negative for behavioral problems, dysphoric mood and sleep disturbance.   Per HPI unless specifically indicated above     Objective:    BP 127/69   Pulse 60   Ht 5\' 4"  (1.626 m)   Wt 213 lb 3.2 oz (96.7 kg)   LMP 11/24/2017 (Exact Date)   SpO2 100%   BMI 36.60 kg/m   Wt Readings from Last 3 Encounters:  08/30/21 213 lb 3.2 oz (96.7 kg)  05/23/21 217 lb 3.2 oz (98.5 kg)  02/15/21 230 lb 9 oz (104.6 kg)    Physical Exam Vitals and nursing note reviewed.  Constitutional:      General: She is not in acute distress.  Appearance: She is well-developed. She is obese. She is not diaphoretic.     Comments: Well-appearing, comfortable, cooperative  HENT:     Head: Normocephalic and atraumatic.  Eyes:     General:        Right eye: No discharge.        Left eye: No discharge.     Conjunctiva/sclera: Conjunctivae normal.     Pupils: Pupils are equal, round, and reactive to light.  Neck:     Thyroid: No thyromegaly.  Cardiovascular:     Rate and Rhythm: Normal rate and regular rhythm.     Pulses: Normal pulses.     Heart sounds: Normal heart sounds. No murmur heard. Pulmonary:     Effort: Pulmonary effort is normal. No respiratory distress.     Breath sounds: Normal breath sounds. No wheezing or rales.  Abdominal:     General: Bowel sounds are normal. There is no distension.     Palpations: Abdomen is soft. There is no mass.     Tenderness: There is no abdominal tenderness.  Musculoskeletal:        General: No tenderness. Normal range of motion.     Cervical back: Normal range of motion and neck supple.     Comments: Upper / Lower Extremities: - Normal muscle tone, strength bilateral upper extremities 5/5, lower extremities 5/5  Lymphadenopathy:     Cervical: No cervical adenopathy.  Skin:    General: Skin is warm and dry.     Findings: No erythema or rash.  Neurological:     Mental Status: She is alert and oriented to person,  place, and time.     Comments: Distal sensation intact to light touch all extremities  Psychiatric:        Mood and Affect: Mood normal.        Behavior: Behavior normal.        Thought Content: Thought content normal.     Comments: Well groomed, good eye contact, normal speech and thoughts   Recent Labs    02/02/21 0754 05/23/21 0850 08/25/21 0832  HGBA1C 5.8 5.3 5.7*     Chemistry      Component Value Date/Time   NA 141 08/25/2021 0832   K 4.2 08/25/2021 0832   CL 105 08/25/2021 0832   CO2 24 08/25/2021 0832   BUN 13 08/25/2021 0832   CREATININE 0.56 08/25/2021 0832      Component Value Date/Time   CALCIUM 9.4 08/25/2021 0832   AST 15 08/25/2021 0832   ALT 15 08/25/2021 0832   BILITOT 0.4 08/25/2021 0832     Lipid Panel     Component Value Date/Time   CHOL 220 (H) 08/25/2021 0832   TRIG 128 08/25/2021 0832   HDL 55 08/25/2021 0832   CHOLHDL 4.0 08/25/2021 0832   LDLCALC 140 (H) 08/25/2021 [place] Outside Information   Pap Smear Specimen:  Thinprep - Cervix uteri structure (body structure) Component 3 wk ago  Case Report  Gynecologic Cytology Report                       Case: [id number]                                Authorizing Provider:  Adalberto Ill, MD    Collected:           08/04/2021  [place] Location:     [facility]       Received:            08/05/2021 4580                                     [facility]                                                                First Screen:          Vinnie Langton                                                            Specimen:    Liquid-Based Pap Smear, Cervix                                                         Interpretation  Negative for intraepithelial lesion or malignancy   Electronically signed by Vinnie Langton on 08/17/2021 at  5:45 PM  Specimen Adequacy  Satisfactory for evaluation, endocervical/transformation zone component absent    LMP  unknown   PAP EDUCATIONAL NOTE    The PAP smear is a screening test used as an aid in detecting cervical cancer and its precursors. Published data indicates that PAP smear testing is subject to both false negative and false positive results. It is NOT a diagnostic procedure and should not be used as the sole means of detecting cervical cancer. Periodic repeat testing and follow-up of any unexplained clinical signs and symptoms are recommended.  Resulting Agency [facility]  Specimen Collected: 08/04/21 11:48 Last Resulted: 08/17/21 17:45  Received From: [facility]  Result Received: 08/24/21 15:28    Results for orders placed or performed in visit on 08/30/21  [facility] PAP SMEAR  Result Value Ref Range   HM Pap smear Negative pap smear and HPV test       Assessment & Plan:   Problem List Items Addressed This Visit     Pre-diabetes    A1c 5.7 improved Concern with obesity, HLD Remains off GLP1  Plan:  1. May keep Metformin 500 BID  2. Encourage improved lifestyle - low carb, low sugar diet, reduce portion size, continue improving regular exercise      Morbid obesity (HCC)    Stable to improved continued wt loss, continue lifestyle management Remains off GLP1 Reconsider options in future      Hypercholesteremia    Controlled cholesterol on lifestyle Last lipid panel 08/2021 The 10-year ASCVD risk score (Arnett DK, et al., 2019) is: 6.4%  Plan: 1. No meds 2. Continue ASA 81mg  for primary ASCVD risk reduction 3. Encourage improved lifestyle - low carb/cholesterol, reduce portion size, continue improving regular  exercise 4. Follow-up 6 month       Other Visit Diagnoses     Annual physical exam    -  Primary       Updated Health Maintenance information Consider Shingrix Mammogram yearly, last done 02/2021 can do 1-2 years surveillance Cologuard done, 02/2021 Reviewed recent lab results with patient Encouraged improvement to lifestyle  with diet and exercise Goal of weight loss   No orders of the defined types were placed in this encounter.     Follow up plan: Return in about 6 months (around 02/27/2022) for 6 month fasting lab only 1 week later Follow-up PreDM Cholesterol result.  Future labs Lipid + A1c 6 month.  Nobie Putnam, [facility] 08/30/2021, 10:41 AM

## 2021-08-30 NOTE — Assessment & Plan Note (Addendum)
Stable to improved continued wt loss, continue lifestyle management Remains off GLP1 Reconsider options in future

## 2021-08-30 NOTE — Assessment & Plan Note (Signed)
A1c 5.7 improved Concern with obesity, HLD Remains off GLP1  Plan:  1. May keep Metformin 500 BID  2. Encourage improved lifestyle - low carb, low sugar diet, reduce portion size, continue improving regular exercise

## 2021-08-30 NOTE — Assessment & Plan Note (Signed)
Controlled cholesterol on lifestyle Last lipid panel 08/2021 The 10-year ASCVD risk score (Arnett DK, et al., 2019) is: 6.4%  Plan: 1. No meds 2. Continue ASA 81mg  for primary ASCVD risk reduction 3. Encourage improved lifestyle - low carb/cholesterol, reduce portion size, continue improving regular exercise 4. Follow-up 6 month

## 2021-08-30 NOTE — Patient Instructions (Addendum)
Thank you for coming to the office today.  Recent Labs    02/02/21 0754 05/23/21 0850 08/25/21 0832  HGBA1C 5.8 5.3 5.7*   Keep up the great work overall.  Shingles vaccine in future when ready - 2 doses, 2-6 months apart can go to pharmacy  DUE for FASTING BLOOD WORK (no food or drink after midnight before the lab appointment, only water or coffee without cream/sugar on the morning of)  SCHEDULE "Lab Only" visit in the morning at the clinic for lab draw in 6 MONTHS   - Make sure Lab Only appointment is at about 1 week before your next appointment, so that results will be available  For Lab Results, once available within 2-3 days of blood draw, you can can log in to MyChart online to view your results and a brief explanation. Also, we can discuss results at next follow-up visit.    Please schedule a Follow-up Appointment to: Return in about 6 months (around 02/27/2022) for 6 month fasting lab only 1 week later Follow-up PreDM Cholesterol result.  If you have any other questions or concerns, please feel free to call the office or send a message through [place]. You may also schedule an earlier appointment if necessary.  Additionally, you may be receiving a survey about your experience at our office within a few days to 1 week by e-mail or mail. We value your feedback.  Nobie Putnam, DO [place]

## 2021-10-25 ENCOUNTER — Ambulatory Visit: Payer: Self-pay | Admitting: *Deleted

## 2021-10-25 NOTE — Telephone Encounter (Signed)
She will need a virtual appt to be prescribed any medication.

## 2021-10-25 NOTE — Telephone Encounter (Signed)
Reason for Disposition  [1] HIGH RISK for severe COVID complications (e.g., weak immune system, age > 79 years, obesity with BMI > 25, pregnant, chronic lung disease or other chronic medical condition) AND [2] COVID symptoms (e.g., cough, fever)  (Exceptions: Already seen by PCP and no new or worsening symptoms.)  Answer Assessment - Initial Assessment Questions 1. COVID-19 DIAGNOSIS: "Who made your COVID-19 diagnosis?" "Was it confirmed by a positive lab test or self-test?" If not diagnosed by a doctor (or NP/PA), ask "Are there lots of cases (community spread) where you live?" Note: See public health department website, if unsure.     Tested positive for COvid 10/25/2021 2. COVID-19 EXPOSURE: "Was there any known exposure to COVID before the symptoms began?" CDC Definition of close contact: within 6 feet (2 meters) for a total of 15 minutes or more over a 24-hour period.      Husband having symptoms and is positive yesterday 3. ONSET: "When did the COVID-19 symptoms start?"      Yesterday 4. WORST SYMPTOM: "What is your worst symptom?" (e.g., cough, fever, shortness of breath, muscle aches)     5. COUGH: "Do you have a cough?" If Yes, ask: "How bad is the cough?"       Yes I'm not able to cough up anything.   Post nasal drip 6. FEVER: "Do you have a fever?" If Yes, ask: "What is your temperature, how was it measured, and when did it start?"     99 and having chills 7. RESPIRATORY STATUS: "Describe your breathing?" (e.g., shortness of breath, wheezing, unable to speak)      No shortness of breath or chest pain.    I have an inhaler. 8. BETTER-SAME-WORSE: "Are you getting better, staying the same or getting worse compared to yesterday?"  If getting worse, ask, "In what way?"     *No Answer* 9. HIGH RISK DISEASE: "Do you have any chronic medical problems?" (e.g., asthma, heart or lung disease, weak immune system, obesity, etc.)     Overweight   10. VACCINE: "Have you had the COVID-19 vaccine?" If  Yes, ask: "Which one, how many shots, when did you get it?"       *No Answer* 11. BOOSTER: "Have you received your COVID-19 booster?" If Yes, ask: "Which one and when did you get it?"       *No Answer* 12. PREGNANCY: "Is there any chance you are pregnant?" "When was your last menstrual period?"       *No Answer* 13. OTHER SYMPTOMS: "Do you have any other symptoms?"  (e.g., chills, fatigue, headache, loss of smell or taste, muscle pain, sore throat)       See above 14. O2 SATURATION MONITOR:  "Do you use an oxygen saturation monitor (pulse oximeter) at home?" If Yes, ask "What is your reading (oxygen level) today?" "What is your usual oxygen saturation reading?" (e.g., 95%)       *No Answer*  Protocols used: Coronavirus (COVID-19) Diagnosed or Suspected-A-AH

## 2021-10-25 NOTE — Telephone Encounter (Signed)
°  Chief Complaint: Tested positive for Covid with a home test this morning. Symptoms: Runny nose, nasal congestion, wet cough but not bringing up anything, post nasal drip, chills, body aches and low grade fever 99 Frequency: Symptoms started yesterday  (Her husband is positive also) Pertinent Negatives: Patient denies shortness of breath or chest pain Disposition: [] ED /[] Urgent Care (no appt availability in office) / [] Appointment(In office/virtual)/ []  [place] [facility]/ [] Home Care/ [] Refused Recommended Disposition /[] [place] [facility]/ [x]  Follow-up with PCP Additional Notes: She is wanting to know if she is a candidate for an antiviral.   She does want something called in for the cough and congestion.

## 2021-10-26 ENCOUNTER — Telehealth: Payer: Managed Care, Other (non HMO) | Admitting: Family Medicine

## 2021-10-26 ENCOUNTER — Encounter: Payer: Self-pay | Admitting: Family Medicine

## 2021-10-26 VITALS — Wt 213.0 lb

## 2021-10-26 DIAGNOSIS — U071 COVID-19: Secondary | ICD-10-CM | POA: Diagnosis not present

## 2021-10-26 MED ORDER — IPRATROPIUM BROMIDE 0.06 % NA SOLN: 2.0000 | Freq: Four times a day (QID) | NASAL | 0 refills | Status: AC

## 2021-10-26 MED ORDER — BENZONATATE 100 MG PO CAPS: 100.0000 mg | ORAL_CAPSULE | Freq: Three times a day (TID) | ORAL | 0 refills | Status: DC | PRN

## 2021-10-26 MED ORDER — NIRMATRELVIR/RITONAVIR (PAXLOVID)TABLET: 3.0000 | ORAL_TABLET | Freq: Two times a day (BID) | ORAL | 0 refills | Status: AC

## 2021-10-26 NOTE — Patient Instructions (Addendum)
Start Tessalon Perls take 1 capsule up to 3 times a day as needed for cough Start Atrovent nasal spray decongestant 2 sprays in each nostril up to 4 times daily for 7 days Paxlovid medication 5 days Follow-up if not improving or new concerns  Please schedule a Follow-up Appointment to: Return if symptoms worsen or fail to improve.  If you have any other questions or concerns, please feel free to call the office or send a message through [place]. You may also schedule an earlier appointment if necessary.  Additionally, you may be receiving a survey about your experience at our office within a few days to 1 week by e-mail or mail. We value your feedback.  Nobie Putnam, DO [place]

## 2021-10-26 NOTE — Progress Notes (Signed)
Subjective:    Patient ID: Kara Porter, female    DOB: 12/01/1960, 61 y.o.   MRN: [id number]  Kara Porter is a 61 y.o. female presenting on 10/26/2021 for Cough and Sore Throat  Virtual / Telehealth Encounter - Video Visit via MyChart The purpose of this virtual visit is to provide medical care while limiting exposure to the novel coronavirus (COVID19) for both patient and office staff.  Consent was obtained for remote visit:  Yes.   Answered questions that patient had about telehealth interaction:  Yes.   I discussed the limitations, risks, security and privacy concerns of performing an evaluation and management service by video/telephone. I also discussed with the patient that there may be a patient responsible charge related to this service. The patient expressed understanding and agreed to proceed.  Patient Location: Home Provider Location: [facility] (Office)  Participants in virtual visit: - Patient: Kara Porter - CMA: Orinda Kenner, CMA - Provider: Dr Parks Ranger   HPI  COVID-19 Positive Reports symptoms started 2 days ago on 1/9 and tested positive for covid home test on Tues 1/10. Initial with fever chills and then that improved some, still feels feverish and achey but no longer measuring a temp when checking. Admits coughing congestion. Not taking any other OTC except Did try Tylenol Sinus med  Admits some tightness in chest with cough, has albuterol PRN with some good results. She was taken out of work for 5 days for quarantine Her husband tested positive on Monday and he was treated with tessalon, atrovent and paxlovid  Health Maintenance: Initial covid vaccine series, does not have recent booster.  Depression screen [facility] 2/9 08/30/2021 02/15/2021 08/18/2020  Decreased Interest 1 1 0  Down, Depressed, Hopeless 0 1 0  PHQ - 2 Score 1 2 0  Altered sleeping 0 1 -  Tired, decreased energy 1 1 -  Change in appetite 0 0 -  Feeling bad or failure  about yourself  0 0 -  Trouble concentrating 0 0 -  Moving slowly or fidgety/restless 0 0 -  Suicidal thoughts 0 0 -  PHQ-9 Score 2 4 -  Difficult doing work/chores Not difficult at all Somewhat difficult -    Social History   Tobacco Use   Smoking status: Former    Packs/day: 0.75    Years: 6.00    Pack years: 4.50    Types: Cigarettes    Quit date: 2018    Years since quitting: 5.0   Smokeless tobacco: Former  Scientific laboratory technician Use: Never used  Substance Use Topics   Alcohol use: Not Currently   Drug use: Never    Review of Systems Per HPI unless specifically indicated above     Objective:    Wt 213 lb (96.6 kg)    LMP 11/24/2017 (Exact Date)    BMI 36.56 kg/m   Wt Readings from Last 3 Encounters:  10/26/21 213 lb (96.6 kg)  08/30/21 213 lb 3.2 oz (96.7 kg)  05/23/21 217 lb 3.2 oz (98.5 kg)    Physical Exam  Note examination was completely remotely via video observation objective data only  Gen - well-appearing, no acute distress or apparent pain, comfortable HEENT - eyes appear clear without discharge or redness Heart/Lungs - cannot examine virtually - observed no evidence of coughing or labored breathing. Abd - cannot examine virtually  Skin - face visible today- no rash Neuro - awake, alert, oriented Psych - not anxious appearing  Results for orders placed or performed in visit on 08/30/21  HM PAP SMEAR  Result Value Ref Range   HM Pap smear Negative pap smear and HPV test       Assessment & Plan:   Problem List Items Addressed This Visit   None Visit Diagnoses     COVID-19 virus infection    -  Primary   Relevant Medications   benzonatate (TESSALON) 100 MG capsule   ipratropium (ATROVENT) 0.06 % nasal spray   nirmatrelvir/ritonavir EUA (PAXLOVID) 20 x 150 MG & 10 x 100MG  TABS       COVID19 positive Symptom 1st onset 10/24/21 Confirm home test positive 10/25/21 Sick contact COVID+ Husband at home, on paxlovid now Mild to moderate  symptoms currently. No red flags or dyspnea Limited risk factors age 5, has history of bronchospasm, morbid obesity  Start Paxlovid, GFR >60, 5 day course as prescribed, regular dosing with normal kidney function GFR >60. Reviewed med interaction checker without interactions  Start Atrovent nasal spray decongestant 2 sprays in each nostril up to 4 times daily for 7 days Start Tessalon Perls take 1 capsule up to 3 times a day as needed for cough  Counseling provided Supportive care OTC PRN Follow-up criteria given.   Meds ordered this encounter  Medications   benzonatate (TESSALON) 100 MG capsule    Sig: Take 1 capsule (100 mg total) by mouth 3 (three) times daily as needed for cough.    Dispense:  30 capsule    Refill:  0   ipratropium (ATROVENT) 0.06 % nasal spray    Sig: Place 2 sprays into both nostrils 4 (four) times daily. For up to 5-7 days then stop.    Dispense:  15 mL    Refill:  0   nirmatrelvir/ritonavir EUA (PAXLOVID) 20 x 150 MG & 10 x 100MG  TABS    Sig: Take 3 tablets by mouth 2 (two) times daily for 5 days. (Take nirmatrelvir 150 mg two tablets twice daily for 5 days and ritonavir 100 mg one tablet twice daily for 5 days) Patient GFR is >60    Dispense:  30 tablet    Refill:  0      Follow up plan: Return if symptoms worsen or fail to improve.  Nobie Putnam, [facility] 10/26/2021, 11:44 AM

## 2021-11-09 ENCOUNTER — Encounter: Payer: Self-pay | Admitting: Family Medicine

## 2021-11-09 ENCOUNTER — Ambulatory Visit: Payer: Managed Care, Other (non HMO) | Admitting: Family Medicine

## 2021-11-09 ENCOUNTER — Other Ambulatory Visit: Payer: Self-pay

## 2021-11-09 ENCOUNTER — Ambulatory Visit: Payer: Self-pay

## 2021-11-09 DIAGNOSIS — B379 Candidiasis, unspecified: Secondary | ICD-10-CM

## 2021-11-09 DIAGNOSIS — J0110 Acute frontal sinusitis, unspecified: Secondary | ICD-10-CM | POA: Diagnosis not present

## 2021-11-09 DIAGNOSIS — T3695XA Adverse effect of unspecified systemic antibiotic, initial encounter: Secondary | ICD-10-CM

## 2021-11-09 MED ORDER — FLUCONAZOLE 150 MG PO TABS: ORAL_TABLET | ORAL | 1 refills | Status: DC

## 2021-11-09 MED ORDER — AMOXICILLIN-POT CLAVULANATE 875-125 MG PO TABS: 1.0000 | ORAL_TABLET | Freq: Two times a day (BID) | ORAL | 0 refills | Status: DC

## 2021-11-09 NOTE — Patient Instructions (Addendum)
1. It sounds like you have a Sinusitis (Bacterial Infection) - this most likely started as an Upper Respiratory Virus that has settled into an infection. Allergies can also cause this. - Start Augmentin 1 pill twice daily (breakfast and dinner, with food and plenty of water) for 10 days, complete entire course, do not stop early even if feeling better Diflucan if needed Use previous nose spray if need decongestant Sudafed OTC Mucinex for up to 1 week - Improve hydration by drinking plenty of clear fluids (water, gatorade) to reduce secretions and thin congestion - Congestion draining down throat can cause irritation. May try warm herbal tea with honey, cough drops - Can take Tylenol or Ibuprofen as needed for fevers  If you develop persistent fever >101F for at least 3 consecutive days, headaches with sinus pain or pressure or persistent earache, please schedule a follow-up evaluation within next few days to week.   Please schedule a Follow-up Appointment to: Return if symptoms worsen or fail to improve.  If you have any other questions or concerns, please feel free to call the office or send a message through [place]. You may also schedule an earlier appointment if necessary.  Additionally, you may be receiving a survey about your experience at our office within a few days to 1 week by e-mail or mail. We value your feedback.  Nobie Putnam, DO [place]

## 2021-11-09 NOTE — Telephone Encounter (Addendum)
° °  Chief Complaint: Continued cough and sinus congestion from COVID 19. Symptoms: Chest tight from coughing. 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 Please advise pt.  Reason for Disposition  [1] PERSISTING SYMPTOMS OF COVID-19 AND [2] symptoms WORSE  Answer Assessment - Initial Assessment Questions 1. COVID-19 ONSET: "When did the symptoms of COVID-19 first start?"     10/25/21 2. DIAGNOSIS CONFIRMATION: "How were you diagnosed?" (e.g., COVID-19 oral or nasal viral test; COVID-19 antibody test; doctor visit)     Home test 3. MAIN SYMPTOM:  "What is your main concern or symptom right now?" (e.g., breathing difficulty, cough, fatigue. loss of smell)     Cough and sinus congestion 4. SYMPTOM ONSET: "When did the  cough  start?"     10/25/21 5. BETTER-SAME-WORSE: "Are you getting better, staying the same, or getting worse over the last 1 to 2 weeks?"     Same 6. RECENT MEDICAL VISIT: "Have you been seen by a healthcare provider (doctor, NP, PA) for these persisting COVID-19 symptoms?" If Yes, ask: "When were you seen?" (e.g., date)     Yes 7. COUGH: "Do you have a cough?" If Yes, ask: "How bad is the cough?"       No 8. FEVER: "Do you have a fever?" If Yes, ask: "What is your temperature, how was it measured, and when did it start?"     No 9. BREATHING DIFFICULTY: "Are you having any trouble breathing?" If Yes, ask: "How bad is your breathing?" (e.g., mild, moderate, severe)    - MILD: No SOB at rest, mild SOB with walking, speaks normally in sentences, can lie down, no retractions, pulse < 100.    - MODERATE: SOB at rest, SOB with minimal exertion and prefers  to sit, cannot lie down flat, speaks in phrases, mild retractions, audible wheezing, pulse 100-120.    - SEVERE: Very SOB at rest, speaks in single words, struggling to breathe, sitting hunched forward, retractions, pulse > 120       No 10. HIGH RISK DISEASE: "Do you have any chronic medical problems?" (e.g., asthma, heart or lung disease, weak immune system, obesity, etc.)       Pre-diabetic 11. VACCINE: "Have you gotten the COVID-19 vaccine?" If Yes, ask: "Which one, how many shots, when did you get it?"       N/a 12. BOOSTER: "Have you received your COVID-19 booster?" If Yes, ask: "Which one and when did you get it?"       N/a 13. PREGNANCY: "Is there any chance you are pregnant?" "When was your last menstrual period?"       No 14. OTHER SYMPTOMS: "Do you have any other symptoms?"  (e.g., fatigue, headache, muscle pain, weakness)       No 15. O2 SATURATION MONITOR:  "Do you use an oxygen saturation monitor (pulse oximeter) at home?" If Yes, ask "What is your reading (oxygen level) today?" "What is your usual oxygen saturation reading?" (e.g., 95%)       No  Protocols used: Coronavirus (COVID-19) Persisting Symptoms Follow-up Call-[facility]

## 2021-11-09 NOTE — Progress Notes (Signed)
Virtual Visit via Telephone The purpose of this virtual visit is to provide medical care while limiting exposure to the novel coronavirus (COVID19) for both patient and office staff.  Consent was obtained for phone visit:  Yes.   Answered questions that patient had about telehealth interaction:  Yes.   I discussed the limitations, risks, security and privacy concerns of performing an evaluation and management service by telephone. I also discussed with the patient that there may be a patient responsible charge related to this service. The patient expressed understanding and agreed to proceed.  Patient Location: Home Provider Location: [facility] (Office)  Participants in virtual visit: - Patient: Kara Porter - CMA: Orinda Kenner, [place] - Provider: Dr Parks Ranger  ---------------------------------------------------------------------- Chief Complaint  Patient presents with   Sinusitis    S: Reviewed CMA documentation. I have called patient and gathered additional HPI as follows:  Post-COVID19 vs recurrent sinusitis Reports that symptoms started 10/24/21 initially with positive covid and treatment at that time on 10/26/21 virtual visit with me, she did Paxlovid, and Tessalon, Atorvent nasal, notable improvement overall. But then concern symptoms returned. - She is taking OTC Tylenol Cold/Flu Admits lymph node swelling in neck Admits feels similar to prior sinusitis She has inhaler albuterol PRN She does not have a fever, but still feels like low grade temp fighting something Admits  sinus pain or pressure Denies any fevers, sweats, body ache, shortness of breath, headache, abdominal pain, diarrhea  Past Medical History:  Diagnosis Date   Irritable bowel syndrome 2019   Social History   Tobacco Use   Smoking status: Former    Packs/day: 0.75    Years: 6.00    Pack years: 4.50    Types: Cigarettes    Quit date: 2018    Years since quitting: 5.0   Smokeless  tobacco: Former  Scientific laboratory technician Use: Never used  Substance Use Topics   Alcohol use: Not Currently   Drug use: Never    Current Outpatient Medications:    amoxicillin-clavulanate (AUGMENTIN) 875-125 MG tablet, Take 1 tablet by mouth 2 (two) times daily., Disp: 20 tablet, Rfl: 0   fluconazole (DIFLUCAN) 150 MG tablet, Take one tablet by mouth on Day 1. Repeat dose 2nd tablet on Day 3., Disp: 2 tablet, Rfl: 1   Albuterol Sulfate (PROAIR RESPICLICK) 329 (90 Base) MCG/ACT AEPB, Inhale 2 puffs into the lungs every 6 (six) hours as needed., Disp: 1 each, Rfl: 0   benzonatate (TESSALON) 100 MG capsule, Take 1 capsule (100 mg total) by mouth 3 (three) times daily as needed for cough., Disp: 30 capsule, Rfl: 0   clotrimazole-betamethasone (LOTRISONE) cream, Apply 1-2 times a day for worsening flare intertrigo, may re-use daily up to 1 week as needed., Disp: 30 g, Rfl: 1   hydrochlorothiazide (HYDRODIURIL) 25 MG tablet, TAKE 1 TABLET BY MOUTH EVERY DAY, Disp: 90 tablet, Rfl: 3   ipratropium (ATROVENT) 0.06 % nasal spray, Place 2 sprays into both nostrils 4 (four) times daily. For up to 5-7 days then stop., Disp: 15 mL, Rfl: 0   meloxicam (MOBIC) 15 MG tablet, Take 1 tablet (15 mg total) by mouth as needed., Disp: 30 tablet, Rfl: 3   metFORMIN (GLUCOPHAGE) 500 MG tablet, TAKE 1 TABLET BY MOUTH 2 TIMES DAILY WITH A MEAL., Disp: 180 tablet, Rfl: 0   montelukast (SINGULAIR) 10 MG tablet, Take 1 tablet (10 mg total) by mouth at bedtime., Disp: 90 tablet, Rfl: 1   Multiple  Vitamin (MULTIVITAMIN) LIQD, Take 5 mLs by mouth daily., Disp: , Rfl:    triamcinolone cream (KENALOG) 0.1 %, Apply 1 application topically 2 (two) times daily as needed. For 1-2 weeks. Do not use on face., Disp: 30 g, Rfl: 0   Turmeric (QC TUMERIC COMPLEX PO), Take 1,000 mg by mouth., Disp: , Rfl:    valACYclovir (VALTREX) 500 MG tablet, Take 1 tablet (500 mg total) by mouth 3 (three) times daily as needed. PRN herpes flare, Disp: 90  tablet, Rfl: 3  Depression screen [facility] 2/9 08/30/2021 02/15/2021 08/18/2020  Decreased Interest 1 1 0  Down, Depressed, Hopeless 0 1 0  PHQ - 2 Score 1 2 0  Altered sleeping 0 1 -  Tired, decreased energy 1 1 -  Change in appetite 0 0 -  Feeling bad or failure about yourself  0 0 -  Trouble concentrating 0 0 -  Moving slowly or fidgety/restless 0 0 -  Suicidal thoughts 0 0 -  PHQ-9 Score 2 4 -  Difficult doing work/chores Not difficult at all Somewhat difficult -    GAD 7 : Generalized Anxiety Score 08/30/2021  Nervous, Anxious, on Edge 0  Control/stop worrying 0  Worry too much - different things 0  Trouble relaxing 0  Restless 0  Easily annoyed or irritable 1  Afraid - awful might happen 0  Total GAD 7 Score 1  Anxiety Difficulty Not difficult at all    -------------------------------------------------------------------------- O: No physical exam performed due to remote telephone encounter.  Lab results reviewed.  Recent Results (from the past 2160 hour(s))  TSH     Status: None   Collection Time: 08/25/21  8:32 AM  Result Value Ref Range   TSH 1.18 0.40 - 4.50 mIU/L  Hemoglobin A1c     Status: Abnormal   Collection Time: 08/25/21  8:32 AM  Result Value Ref Range   Hgb A1c MFr Bld 5.7 (H) <5.7 % of total Hgb    Comment: For someone without known diabetes, a hemoglobin  A1c value between 5.7% and 6.4% is consistent with prediabetes and should be confirmed with a  follow-up test. . For someone with known diabetes, a value <7% indicates that their diabetes is well controlled. A1c targets should be individualized based on duration of diabetes, age, comorbid conditions, and other considerations. . This assay result is consistent with an increased risk of diabetes. . Currently, no consensus exists regarding use of hemoglobin A1c for diagnosis of diabetes for children. .    Mean Plasma Glucose 117 mg/dL   eAG (mmol/L) 6.5 mmol/L  CBC with Differential/Platelet      Status: None   Collection Time: 08/25/21  8:32 AM  Result Value Ref Range   WBC 7.9 3.8 - 10.8 Thousand/uL   RBC 4.12 3.80 - 5.10 Million/uL   Hemoglobin 12.7 11.7 - 15.5 g/dL   HCT 38.4 35.0 - 45.0 %   MCV 93.2 80.0 - 100.0 fL   MCH 30.8 27.0 - 33.0 pg   MCHC 33.1 32.0 - 36.0 g/dL   RDW 12.4 11.0 - 15.0 %   Platelets 351 140 - 400 Thousand/uL   MPV 9.5 7.5 - 12.5 fL   Neutro Abs 4,811 1,500 - 7,800 cells/uL   Lymphs Abs 2,607 850 - 3,900 cells/uL   Absolute Monocytes 411 200 - 950 cells/uL   Eosinophils Absolute 47 15 - 500 cells/uL   Basophils Absolute 24 0 - 200 cells/uL   Neutrophils Relative % 60.9 %  Total Lymphocyte 33.0 %   Monocytes Relative 5.2 %   Eosinophils Relative 0.6 %   Basophils Relative 0.3 %  Lipid panel     Status: Abnormal   Collection Time: 08/25/21  8:32 AM  Result Value Ref Range   Cholesterol 220 (H) <200 mg/dL   HDL 55 > OR = 50 mg/dL   Triglycerides 128 <150 mg/dL   LDL Cholesterol (Calc) 140 (H) mg/dL (calc)    Comment: Reference range: <100 . Desirable range <100 mg/dL for primary prevention;   <70 mg/dL for patients with CHD or diabetic patients  with > or = 2 CHD risk factors. Marland Kitchen LDL-C is now calculated using the Martin-Hopkins  calculation, which is a validated novel method providing  better accuracy than the Friedewald equation in the  estimation of LDL-C.  Cresenciano Genre et al. Annamaria Helling. 9390;300(92): 2061-2068  (http://education.[facility].com/faq/FAQ164)    Total CHOL/HDL Ratio 4.0 <5.0 (calc)   Non-HDL Cholesterol (Calc) 165 (H) <130 mg/dL (calc)    Comment: For patients with diabetes plus 1 major ASCVD risk  factor, treating to a non-HDL-C goal of <100 mg/dL  (LDL-C of <70 mg/dL) is considered a therapeutic  option.   COMPLETE METABOLIC PANEL WITH GFR     Status: Abnormal   Collection Time: 08/25/21  8:32 AM  Result Value Ref Range   Glucose, Bld 107 (H) 65 - 99 mg/dL    Comment: .            Fasting reference  interval . For someone without known diabetes, a glucose value between 100 and 125 mg/dL is consistent with prediabetes and should be confirmed with a follow-up test. .    BUN 13 7 - 25 mg/dL   Creat 0.56 0.50 - 1.03 mg/dL   eGFR 106 > OR = 60 mL/min/1.50m    Comment: The eGFR is based on the CKD-EPI 2021 equation. To calculate  the new eGFR from a previous Creatinine or Cystatin C result, go to https://www.kidney.org/professionals/ kdoqi/gfr%5Fcalculator    BUN/Creatinine Ratio NOT APPLICABLE 6 - 22 (calc)   Sodium 141 135 - 146 mmol/L   Potassium 4.2 3.5 - 5.3 mmol/L   Chloride 105 98 - 110 mmol/L   CO2 24 20 - 32 mmol/L   Calcium 9.4 8.6 - 10.4 mg/dL   Total Protein 7.2 6.1 - 8.1 g/dL   Albumin 4.3 3.6 - 5.1 g/dL   Globulin 2.9 1.9 - 3.7 g/dL (calc)   AG Ratio 1.5 1.0 - 2.5 (calc)   Total Bilirubin 0.4 0.2 - 1.2 mg/dL   Alkaline phosphatase (APISO) 95 37 - 153 U/L   AST 15 10 - 35 U/L   ALT 15 6 - 29 U/L    -------------------------------------------------------------------------- A&P:  Problem List Items Addressed This Visit   None Visit Diagnoses     Acute non-recurrent frontal sinusitis    -  Primary   Relevant Medications   amoxicillin-clavulanate (AUGMENTIN) 875-125 MG tablet   fluconazole (DIFLUCAN) 150 MG tablet   Antibiotic-induced yeast infection       Relevant Medications   fluconazole (DIFLUCAN) 150 MG tablet      Consistent with acute frontal sinusitis, likely initially viral URI vs allergic rhinitis component with worsening concern for bacterial infection.  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 Start Augmentin 875-1239mPO BID x 10 days 2. Diflucan if need Sudafed, Mucinex  as advised Hold off on steroid since not dyspnea wheezing Use Albuterol PRN Return criteria reviewed   Meds ordered this encounter  Medications    amoxicillin-clavulanate (AUGMENTIN) 875-125 MG tablet    Sig: Take 1 tablet by mouth 2 (two) times daily.    Dispense:  20 tablet    Refill:  0   fluconazole (DIFLUCAN) 150 MG tablet    Sig: Take one tablet by mouth on Day 1. Repeat dose 2nd tablet on Day 3.    Dispense:  2 tablet    Refill:  1    Follow-up: - Return in PRN if not improved  Patient verbalizes understanding with the above medical recommendations including the limitation of remote medical advice.  Specific follow-up and call-back criteria were given for patient to follow-up or seek medical care more urgently if needed.   - Time spent in direct consultation with patient on phone: 8 minutes   Nobie Putnam, [facility] 11/09/2021, 4:24 PM

## 2021-11-09 NOTE — Telephone Encounter (Signed)
I rescheduled for today (virtually) at 4pm.

## 2021-11-11 ENCOUNTER — Telehealth: Payer: Managed Care, Other (non HMO) | Admitting: Family Medicine

## 2021-11-17 ENCOUNTER — Other Ambulatory Visit: Payer: Self-pay | Admitting: Family Medicine

## 2021-11-17 DIAGNOSIS — U071 COVID-19: Secondary | ICD-10-CM

## 2021-11-17 NOTE — Telephone Encounter (Signed)
Requested medication (s) are due for refill today: Early  Requested medication (s) are on the active medication list: Yes  Last refill:  10/26/21  Future visit scheduled: No  Notes to clinic:  See request.    Requested Prescriptions  Pending Prescriptions Disp Refills   ipratropium (ATROVENT) 0.06 % nasal spray [Pharmacy Med Name: IPRATROPIUM 0.06% SPRAY]      Sig: Place 2 sprays into both nostrils 4 (four) times daily. For up to 5-7 days then stop.     Off-Protocol Failed - 11/17/2021  1:32 PM      Failed - Medication not assigned to a protocol, review manually.      Passed - Valid encounter within last 12 months    Recent Outpatient Visits           1 week ago Acute non-recurrent frontal sinusitis   [facility] [place], Devonne Doughty, DO   3 weeks ago COVID-19 virus infection   [place], DO   2 months ago Annual physical exam   [facility] Olin Hauser, DO   5 months ago Pre-diabetes   [place], DO   9 months ago Obesity (BMI 30-39.9)   [place], DO             Off-Protocol Failed - 11/17/2021  1:32 PM      Failed - Medication not assigned to a protocol, review manually.      Passed - Valid encounter within last 12 months    Recent Outpatient Visits           1 week ago Acute non-recurrent frontal sinusitis   [facility], Devonne Doughty, DO   3 weeks ago COVID-19 virus infection   [place], DO   2 months ago Annual physical exam   [facility] Olin Hauser, DO   5 months ago Pre-diabetes   [place], DO   9 months ago Obesity (BMI 30-39.9)   [facility], Devonne Doughty, DO

## 2022-01-24 ENCOUNTER — Telehealth (INDEPENDENT_AMBULATORY_CARE_PROVIDER_SITE_OTHER): Payer: Managed Care, Other (non HMO) | Admitting: Family Medicine

## 2022-01-24 ENCOUNTER — Ambulatory Visit: Payer: Self-pay | Admitting: *Deleted

## 2022-01-24 ENCOUNTER — Encounter: Payer: Self-pay | Admitting: Family Medicine

## 2022-01-24 VITALS — Ht 64.0 in | Wt 213.0 lb

## 2022-01-24 DIAGNOSIS — J0110 Acute frontal sinusitis, unspecified: Secondary | ICD-10-CM

## 2022-01-24 DIAGNOSIS — B379 Candidiasis, unspecified: Secondary | ICD-10-CM | POA: Diagnosis not present

## 2022-01-24 MED ORDER — FLUCONAZOLE 150 MG PO TABS: ORAL_TABLET | ORAL | 0 refills | Status: DC

## 2022-01-24 MED ORDER — AMOXICILLIN-POT CLAVULANATE 875-125 MG PO TABS: 1.0000 | ORAL_TABLET | Freq: Two times a day (BID) | ORAL | 0 refills | Status: DC

## 2022-01-24 NOTE — Progress Notes (Signed)
? ?Subjective:  ? ? Patient ID: Kara Porter, female    DOB: [birth year], 61 y.o.   MRN: [id number] ? ?Kara Porter is a 61 y.o. female presenting on 01/24/2022 for Sinusitis ? ?Virtual / Telehealth Encounter - Video Visit via MyChart ?The purpose of this virtual visit is to provide medical care while limiting exposure to the novel coronavirus (COVID19) for both patient and office staff. ? ?Consent was obtained for remote visit:  Yes.   ?Answered questions that patient had about telehealth interaction:  Yes.   ?I discussed the limitations, risks, security and privacy concerns of performing an evaluation and management service by video/telephone. I also discussed with the patient that there may be a patient responsible charge related to this service. The patient expressed understanding and agreed to proceed. ? ?Patient Location: Home ?Provider Location: [facility] (Office) ? ?Participants in virtual visit: ?- Patient: Earvin Hansen ?- CMA: Orinda Kenner, CMA ?- Provider: Dr Parks Ranger ? ? ?HPI ? ?Sinusitis ?Reports symptoms started end of last week admits sinus congestion and yellow sinus drainage and some mild dyspnea wheezing at times with cough. Using OTC allergy med options limited relief. ? ? ? ?  01/24/2022  ?  4:10 PM 08/30/2021  ? 10:31 AM 02/15/2021  ?  8:51 AM  ?Depression screen PHQ 2/9  ?Decreased Interest '1 1 1  '$ ?Down, Depressed, Hopeless 0 0 1  ?PHQ - 2 Score '1 1 2  '$ ?Altered sleeping 0 0 1  ?Tired, decreased energy '1 1 1  '$ ?Change in appetite 0 0 0  ?Feeling bad or failure about yourself  0 0 0  ?Trouble concentrating 0 0 0  ?Moving slowly or fidgety/restless 0 0 0  ?Suicidal thoughts 0 0 0  ?PHQ-9 Score '2 2 4  '$ ?Difficult doing work/chores Not difficult at all Not difficult at all Somewhat difficult  ? ? ?Social History  ? ?Tobacco Use  ? Smoking status: Former  ?  Packs/day: 0.75  ?  Years: 6.00  ?  Pack years: 4.50  ?  Types: Cigarettes  ?  Quit date: 2018  ?  Years since quitting: 5.2   ? Smokeless tobacco: Former  ?Vaping Use  ? Vaping Use: Never used  ?Substance Use Topics  ? Alcohol use: Not Currently  ? Drug use: Never  ? ? ?Review of Systems ?Per HPI unless specifically indicated above ? ?   ?Objective:  ?  ?Ht '5\' 4"'$  (1.626 m)   Wt 213 lb (96.6 kg)   LMP 11/24/2017 (Exact Date)   BMI 36.56 kg/m?   ?Wt Readings from Last 3 Encounters:  ?01/24/22 213 lb (96.6 kg)  ?10/26/21 213 lb (96.6 kg)  ?08/30/21 213 lb 3.2 oz (96.7 kg)  ?  ?Physical Exam ? ?Note examination was completely remotely via video observation objective data only ? ?Gen - well-appearing, no acute distress or apparent pain, comfortable ?HEENT - eyes appear clear without discharge or redness ?Heart/Lungs - cannot examine virtually - observed no evidence of coughing or labored breathing. ?Abd - cannot examine virtually  ?Skin - face visible today- no rash ?Neuro - awake, alert, oriented ?Psych - not anxious appearing ? ? ?Results for orders placed or performed in visit on 08/30/21  ?HM PAP SMEAR  ?Result Value Ref Range  ? HM Pap smear Negative pap smear and HPV test   ? ?   ?Assessment & Plan:  ? ?Problem List Items Addressed This Visit   ?None ?Visit Diagnoses   ? ?  Acute non-recurrent frontal sinusitis    -  Primary  ? Relevant Medications  ? amoxicillin-clavulanate (AUGMENTIN) 875-125 MG tablet  ? fluconazole (DIFLUCAN) 150 MG tablet  ? Antibiotic-induced yeast infection      ? Relevant Medications  ? fluconazole (DIFLUCAN) 150 MG tablet  ? ?  ?  ?Consistent with acute frontal sinusitis, likely initially viral URI vs allergic rhinitis component with worsening concern for bacterial infection.  ? ?Plan: ?1. Start Augmentin 875-'125mg'$  PO BID x 10 days ?2. Continue anti histamine, Flonase rx ?Return criteria reviewed ? ? ? ?Meds ordered this encounter  ?Medications  ? amoxicillin-clavulanate (AUGMENTIN) 875-125 MG tablet  ?  Sig: Take 1 tablet by mouth 2 (two) times daily.  ?  Dispense:  20 tablet  ?  Refill:  0  ? fluconazole  (DIFLUCAN) 150 MG tablet  ?  Sig: Take one tablet by mouth on Day 1. Repeat dose 2nd tablet on Day 3.  ?  Dispense:  2 tablet  ?  Refill:  0  ? ? ? ? ?Follow up plan: ?Return if symptoms worsen or fail to improve. ? ? ?Patient verbalizes understanding with the above medical recommendations including the limitation of remote medical advice. ? ?Specific follow-up and call-back criteria were given for patient to follow-up or seek medical care more urgently if needed. ? ?Total duration of direct patient care provided via video conference: 7 minutes ? ? ?Nobie Putnam, DO ?[facility] ?[facility] ?01/24/2022, 4:40 PM ?

## 2022-01-24 NOTE — Telephone Encounter (Signed)
?  Chief Complaint: sinus congestion, pressure, pain, right ear pressure and pain, intermittent wheezing ?Symptoms: post nasal drip, nasal congestion, yellow mucus, right ear pain and pressure, swollen glands in neck that are tender ?Frequency: Since Sat. ?Pertinent Negatives: Patient denies Fever ?Disposition: '[]'$ ED /'[]'$ Urgent Care (no appt availability in office) / '[x]'$ Appointment(In office/virtual)/ '[]'$  [facility]/ '[]'$ Home Care/ '[]'$ Refused Recommended Disposition /'[]'$ [facility]/ '[]'$  Follow-up with PCP ?Additional Notes:   ?

## 2022-01-24 NOTE — Telephone Encounter (Signed)
Reason for Disposition ? [1] Sinus pain (not just congestion) AND [2] fever ? ?Answer Assessment - Initial Assessment Questions ?1. LOCATION: "Where does it hurt?"  ?    Having sinus congestion, pressure and pain in my face.  Also wheezing on and off.   I'm coughing too.  It's draining down my throat.   I have pressure around the cheeks.   Glands in neck are swollen and tender to touch.   No fever.   ?2. ONSET: "When did the sinus pain start?"  (e.g., hours, days)  ?    Saturday ?3. SEVERITY: "How bad is the pain?"   (Scale 1-10; mild, moderate or severe) ?  - MILD (1-3): doesn't interfere with normal activities  ?  - MODERATE (4-7): interferes with normal activities (e.g., work or school) or awakens from sleep ?  - SEVERE (8-10): excruciating pain and patient unable to do any normal activities    ?    Covid test Sat. And Mon both negative.   ?4. RECURRENT SYMPTOM: "Have you ever had sinus problems before?" If Yes, ask: "When was the last time?" and "What happened that time?"  ?    *No Answer* ?5. NASAL CONGESTION: "Is the nose blocked?" If Yes, ask: "Can you open it or must you breathe through your mouth?" ?    Yes ?6. NASAL DISCHARGE: "Do you have discharge from your nose?" If so ask, "What color?" ?    Yellow ?7. FEVER: "Do you have a fever?" If Yes, ask: "What is it, how was it measured, and when did it start?"  ?    No ?8. OTHER SYMPTOMS: "Do you have any other symptoms?" (e.g., sore throat, cough, earache, difficulty breathing) ?    Ear on right side has pressure and pain ?9. PREGNANCY: "Is there any chance you are pregnant?" "When was your last menstrual period?" ?    Not asked ? ?Protocols used: Sinus Pain or Congestion-A-AH ? ?

## 2022-01-25 NOTE — Patient Instructions (Signed)
° °  Please schedule a Follow-up Appointment to: No follow-ups on file. ° °If you have any other questions or concerns, please feel free to call the office or send a message through MyChart. You may also schedule an earlier appointment if necessary. ° °Additionally, you may be receiving a survey about your experience at our office within a few days to 1 week by e-mail or mail. We value your feedback. ° °Drystan Reader, DO °[facility] °

## 2022-06-06 ENCOUNTER — Other Ambulatory Visit: Payer: Self-pay | Admitting: Family Medicine

## 2022-06-06 DIAGNOSIS — M48061 Spinal stenosis, lumbar region without neurogenic claudication: Secondary | ICD-10-CM

## 2022-06-06 NOTE — Telephone Encounter (Signed)
Requested Prescriptions  Pending Prescriptions Disp Refills  . meloxicam (MOBIC) 15 MG tablet [Pharmacy Med Name: MELOXICAM 15 MG TABLET] 30 tablet 3    Sig: TAKE 1 TABLET (15 MG TOTAL) BY MOUTH AS NEEDED.     Analgesics:  COX2 Inhibitors Failed - 06/06/2022  2:10 AM      Failed - Manual Review: Labs are only required if the patient has taken medication for more than 8 weeks.      Passed - HGB in normal range and within 360 days    Hemoglobin  Date Value Ref Range Status  08/25/2021 12.7 11.7 - 15.5 g/dL Final         Passed - Cr in normal range and within 360 days    Creat  Date Value Ref Range Status  08/25/2021 0.56 0.50 - 1.03 mg/dL Final         Passed - HCT in normal range and within 360 days    HCT  Date Value Ref Range Status  08/25/2021 38.4 35.0 - 45.0 % Final         Passed - AST in normal range and within 360 days    AST  Date Value Ref Range Status  08/25/2021 15 10 - 35 U/L Final         Passed - ALT in normal range and within 360 days    ALT  Date Value Ref Range Status  08/25/2021 15 6 - 29 U/L Final         Passed - eGFR is 30 or above and within 360 days    GFR, Est African American  Date Value Ref Range Status  08/18/2020 119 > OR = 60 mL/min/1.60m Final   GFR, Est Non African American  Date Value Ref Range Status  08/18/2020 102 > OR = 60 mL/min/1.7106mFinal   eGFR  Date Value Ref Range Status  08/25/2021 106 > OR = 60 mL/min/1.736minal    Comment:    The eGFR is based on the CKD-EPI 2021 equation. To calculate  the new eGFR from a previous Creatinine or Cystatin C result, go to https://www.kidney.org/professionals/ kdoqi/gfr%5Fcalculator          Passed - Patient is not pregnant      Passed - Valid encounter within last 12 months    Recent Outpatient Visits          4 months ago Acute non-recurrent frontal sinusitis   [facility]   6 months ago Acute non-recurrent frontal sinusitis    [facility]   7 months ago COVID-19 virus infection   [facility]   9 months ago Annual physical exam   [facility]   1 year [facility] [place]

## 2022-07-26 ENCOUNTER — Other Ambulatory Visit: Payer: Self-pay | Admitting: Family Medicine

## 2022-07-26 DIAGNOSIS — B009 Herpesviral infection, unspecified: Secondary | ICD-10-CM

## 2022-07-26 NOTE — Telephone Encounter (Signed)
Requested Prescriptions  Pending Prescriptions Disp Refills  . valACYclovir (VALTREX) 500 MG tablet [Pharmacy Med Name: VALACYCLOVIR HCL 500 MG TABLET] 270 tablet 0    Sig: TAKE 1 TABLET (500 MG TOTAL) BY MOUTH 3 (THREE) TIMES DAILY AS NEEDED FOR FLARE     Antimicrobials:  Antiviral Agents - Anti-Herpetic Passed - 07/26/2022  2:24 AM      Passed - Valid encounter within last 12 months    Recent Outpatient Visits          6 months ago Acute non-recurrent frontal sinusitis   [place], DO   8 months ago Acute non-recurrent frontal sinusitis   [facility] Olin Hauser, DO   9 months ago COVID-19 virus infection   [facility] Hauser, DO   11 months ago Annual physical exam   [facility] Olin Hauser, DO   1 year ago [place], Devonne Doughty, [place]

## 2022-08-18 ENCOUNTER — Encounter: Payer: Self-pay | Admitting: Family Medicine

## 2022-08-18 ENCOUNTER — Ambulatory Visit: Payer: Managed Care, Other (non HMO) | Admitting: Family Medicine

## 2022-08-18 VITALS — BP 138/80 | HR 94 | Ht 64.0 in | Wt 238.0 lb

## 2022-08-18 DIAGNOSIS — F5104 Psychophysiologic insomnia: Secondary | ICD-10-CM | POA: Diagnosis not present

## 2022-08-18 DIAGNOSIS — F3341 Major depressive disorder, recurrent, in partial remission: Secondary | ICD-10-CM | POA: Insufficient documentation

## 2022-08-18 DIAGNOSIS — F411 Generalized anxiety disorder: Secondary | ICD-10-CM

## 2022-08-18 DIAGNOSIS — F332 Major depressive disorder, recurrent severe without psychotic features: Secondary | ICD-10-CM

## 2022-08-18 DIAGNOSIS — F331 Major depressive disorder, recurrent, moderate: Secondary | ICD-10-CM | POA: Insufficient documentation

## 2022-08-18 DIAGNOSIS — F410 Panic disorder [episodic paroxysmal anxiety] without agoraphobia: Secondary | ICD-10-CM

## 2022-08-18 MED ORDER — ESCITALOPRAM OXALATE 10 MG PO TABS: 10.0000 mg | ORAL_TABLET | Freq: Every day | ORAL | 2 refills | Status: DC

## 2022-08-18 MED ORDER — BUSPIRONE HCL 5 MG PO TABS: 5.0000 mg | ORAL_TABLET | Freq: Three times a day (TID) | ORAL | 2 refills | Status: DC | PRN

## 2022-08-18 MED ORDER — TRAZODONE HCL 50 MG PO TABS: 50.0000 mg | ORAL_TABLET | Freq: Every evening | ORAL | 2 refills | Status: DC | PRN

## 2022-08-18 NOTE — Patient Instructions (Addendum)
Thank you for coming to the office today.  Start medication as prescribed Escitalopram Lexapro take half tab for '5mg'$  for first 1-2 weeks, then take whole tab '10mg'$  daily, anticipate 6-12 months, may take 3-4 weeks for full benefit  Use Buspar for anxiety, not instant but can work quickly, usually use in advance of difficult plans or week  Trazodone can be used intermittent for sleep, prefer if need at first take before bed until you are doing better then scale back to intermittent.  Will complete FMLA paperwork, requesting 12 weeks leave and may re-assess sooner within 6 weeks to determine if plan to return sooner with intermittent or not.  These offices have both PSYCHIATRY doctors and [place] (Virtual Available) [place] [address] [place], [place] [address] Phone: [phone]  [facility] Address: [address], [place], [place] [address] [email or web address] Phone: [phone]  [facility] ([place] [facility]) Address: [address], [place], [place] [address] Hours: 8:30AM-5PM Phone: [phone]  [facility] (Adult, [place], Geriatric, Counseling) [address] [place], [place] [address] Phone: [phone] Fax: [phone]  [facility] [place], [place] [address] Phone: [phone]  [facility] (All ages) [address], [place], [id number] Phone: [phone] (Option 1) [email or web address]  ----------------------------------------------------------------- THERAPIST ONLY  (No Psychiatry)  [facility] [address], [place] [address] [place] P: [phone]  [facility]) [facility], [facility] [address] [place], [place] [address] [phone]  [place].   Address: [address] [place], [place] [address] Hours: Open today  9AM-7PM Phone: [phone]  [facility] [address], [place] Address: [address] [place], [place], [place] [address] Phone: [phone]    Please schedule a Follow-up Appointment to: Return in about 6 weeks (around 09/29/2022) for 6 week follow-up depression, anxiety insomnia updates FMLA.  If you have any other questions or concerns, please feel free to call the office or send a message through [place]. You may also schedule an earlier appointment if necessary.  Additionally, you may be receiving a survey about your experience at our office within a few days to 1 week by e-mail or mail. We value your feedback.  Nobie Putnam, DO [place]
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
## 2022-08-23 ENCOUNTER — Ambulatory Visit: Payer: 59 | Admitting: Psychology

## 2022-08-23 DIAGNOSIS — F331 Major depressive disorder, recurrent, moderate: Secondary | ICD-10-CM | POA: Diagnosis not present
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
## 2022-08-29 ENCOUNTER — Ambulatory Visit (INDEPENDENT_AMBULATORY_CARE_PROVIDER_SITE_OTHER): Payer: Managed Care, Other (non HMO) | Admitting: Family Medicine

## 2022-08-29 ENCOUNTER — Ambulatory Visit: Payer: Self-pay

## 2022-08-29 ENCOUNTER — Other Ambulatory Visit: Payer: Self-pay | Admitting: Family Medicine

## 2022-08-29 ENCOUNTER — Encounter: Payer: Self-pay | Admitting: Family Medicine

## 2022-08-29 DIAGNOSIS — J9801 Acute bronchospasm: Secondary | ICD-10-CM

## 2022-08-29 DIAGNOSIS — U071 COVID-19: Secondary | ICD-10-CM | POA: Diagnosis not present

## 2022-08-29 MED ORDER — NIRMATRELVIR/RITONAVIR (PAXLOVID)TABLET: 3.0000 | ORAL_TABLET | Freq: Two times a day (BID) | ORAL | 0 refills | Status: AC

## 2022-08-29 MED ORDER — PROAIR RESPICLICK 108 (90 BASE) MCG/ACT IN AEPB: 2.0000 | INHALATION_SPRAY | Freq: Four times a day (QID) | RESPIRATORY_TRACT | 0 refills | Status: DC | PRN

## 2022-08-29 NOTE — Telephone Encounter (Signed)
  Chief Complaint: Covid Positive Symptoms: Dry cough, stuffy nose , sore throat, lymph nose neck swollen, "Feel a little warm, not bad." Wheeze at times Frequency: Sunday night symptom onset, tested positive today.  Pertinent Negatives: Patient denies SOB Disposition: '[]'$ ED /'[]'$ Urgent Care (no appt availability in office) / '[x]'$ Appointment(In office/virtual)/ '[]'$  [place] [facility]/ '[]'$ Home Care/ '[]'$ Refused Recommended Disposition /'[]'$ [place] [facility]/ '[]'$  Follow-up with PCP Additional Notes: Pt is in [place] with father who has covid. Interested in [place], no availability today. Pt states has had Covid 2 other times "Maybe he would just call it in for me." Assured pt NT would route to practice for PCPs review and final disposition. Care advise provided, verbalizes understanding. Declines review of self isolation guidelines.   If appropriate: [facility] [address] [facility]  Ph# [phone]. PLease advise. Reason for Disposition  [1] HIGH RISK patient (e.g., weak immune system, age > 52 years, obesity with BMI 30 or higher, pregnant, chronic lung disease or other chronic medical condition) AND [2] COVID symptoms (e.g., cough, fever)  (Exceptions: Already seen by PCP and no new or worsening symptoms.)  Answer Assessment - Initial Assessment Questions 1. COVID-19 DIAGNOSIS: "How do you know that you have COVID?" (e.g., positive lab test or self-test, diagnosed by doctor or NP/PA, symptoms after exposure).     Home test 2. COVID-19 EXPOSURE: "Was there any known exposure to COVID before the symptoms began?" CDC Definition of close contact: within 6 feet (2 meters) for a total of 15 minutes or more over a 24-hour period.      Father in SNF positive 3. ONSET: "When did the COVID-19 symptoms start?"      Sunday night 4. WORST SYMPTOM: "What is your worst symptom?" (e.g., cough, fever, shortness of breath, muscle aches)     All 5. COUGH: "Do you have a cough?" If Yes, ask: "How bad is  the cough?"       Yes dry 6. FEVER: "Do you have a fever?" If Yes, ask: "What is your temperature, how was it measured, and when did it start?"     Unsure but feels warm 7. RESPIRATORY STATUS: "Describe your breathing?" (e.g., normal; shortness of breath, wheezing, unable to speak)      No Wheeze 8. BETTER-SAME-WORSE: "Are you getting better, staying the same or getting worse compared to yesterday?"  If getting worse, ask, "In what way?"      9. OTHER SYMPTOMS: "Do you have any other symptoms?"  (e.g., chills, fatigue, headache, loss of smell or taste, muscle pain, sore throat)     Sore throat stuffy nose, lymph nodes swollen 10. HIGH RISK DISEASE: "Do you have any chronic medical problems?" (e.g., asthma, heart or lung disease, weak immune system, obesity, etc.)       *No Answer* 11. VACCINE: "Have you had the COVID-19 vaccine?" If Yes, ask: "Which one, how many shots, when did you get it?"       First 2 vaccines  Protocols used: Coronavirus (COVID-19) Diagnosed or Suspected-A-AH

## 2022-08-29 NOTE — Telephone Encounter (Signed)
Patient called, left VM to return the call to the office to discuss symptoms with a nurse.   Summary: covid and paxlovid   Pt tested positive for covid today / her symptoms started Sunday /Monday / she is experiencing watery eyes, sore throat, stuffy nose / pt asked if Dr. Raliegh Ip can prescribe her Paxlovid / please advise

## 2022-08-29 NOTE — Telephone Encounter (Signed)
Patient added for virtual visit double booked today 11/14 at 4pm  See visit  Nobie Putnam, [facility] 08/29/2022, 4:44 PM

## 2022-08-29 NOTE — Telephone Encounter (Signed)
Requested medication (s) are due for refill today: no  Requested medication (s) are on the active medication list: yes  Last refill:  today  Future visit scheduled: no  Notes to clinic:  PA needed     Requested Prescriptions  Pending Prescriptions Disp [place] 938 (90 Base) MCG/ACT AEPB [Pharmacy Med Name: PROAIR RESPICLICK 90 MCG INHLR] 1 each 0    Sig: TAKE 2 PUFFS BY MOUTH EVERY 6 HOURS AS NEEDED     Pulmonology:  Beta Agonists 2 Passed - 08/29/2022  4:44 PM      Passed - Last BP in normal range    BP Readings from Last 1 Encounters:  08/18/22 138/80         Passed - Last Heart Rate in normal range    Pulse Readings from Last 1 Encounters:  08/18/22 94         Passed - Valid encounter within last 12 months    Recent Outpatient Visits           Today COVID-19 virus infection   [place], Devonne Doughty, DO   1 week ago Generalized anxiety disorder with panic attacks   [place], DO   7 months ago Acute non-recurrent frontal sinusitis   [place], DO   9 months ago Acute non-recurrent frontal sinusitis   [place], DO   10 months ago COVID-19 virus infection   [place], Devonne Doughty, [place]

## 2022-08-29 NOTE — Progress Notes (Signed)
Virtual Visit via Telephone The purpose of this virtual visit is to provide medical care while limiting exposure to the novel coronavirus (COVID19) for both patient and office staff.  Consent was obtained for phone visit:  Yes.   Answered questions that patient had about telehealth interaction:  Yes.   I discussed the limitations, risks, security and privacy concerns of performing an evaluation and management service by telephone. I also discussed with the patient that there may be a patient responsible charge related to this service. The patient expressed understanding and agreed to proceed.  Patient Location: [facility] / [place] Provider Location: [facility] (Office)  Participants in virtual visit: - Patient: Kara Porter - CMA: Orinda Kenner, [place] - Provider: Dr Parks Ranger  ---------------------------------------------------------------------- Chief Complaint  Patient presents with   Covid Positive    S: Reviewed CMA documentation. I have called patient and gathered additional HPI as follows:  COVID-19 Positive Reports that symptoms started today with new diagnosis positive COVID19, [place] visiting her father in nursing home. They have covid and she developed covid now as well. She has had before. New onset symptoms, in past she took paxlovid and improved. Requesting medication today Admits some wheezing, does not have inhaler with her, needs new order while out of state  Denies any fevers, chills, sweats, body ache, cough, shortness of breath, sinus pain or pressure, headache, abdominal pain, diarrhea  Past Medical History:  Diagnosis Date   Irritable bowel syndrome 2019   Social History   Tobacco Use   Smoking status: Former    Packs/day: 0.75    Years: 6.00    Total pack years: 4.50    Types: Cigarettes    Quit date: 2018    Years since quitting: 5.8   Smokeless tobacco: Former  Scientific laboratory technician Use: Never used  Substance Use  Topics   Alcohol use: Not Currently   Drug use: Never    Current Outpatient Medications:    nirmatrelvir/ritonavir EUA (PAXLOVID) 20 x 150 MG & 10 x '100MG'$  TABS, Take 3 tablets by mouth 2 (two) times daily for 5 days. (Take nirmatrelvir 150 mg two tablets twice daily for 5 days and ritonavir 100 mg one tablet twice daily for 5 days) Patient GFR is >60, Disp: 30 tablet, Rfl: 0   Albuterol Sulfate (PROAIR RESPICLICK) 373 (90 Base) MCG/ACT AEPB, Inhale 2 puffs into the lungs every 6 (six) hours as needed., Disp: 1 each, Rfl: 0   busPIRone (BUSPAR) 5 MG tablet, Take 1 tablet (5 mg total) by mouth 3 (three) times daily as needed (anxiety)., Disp: 90 tablet, Rfl: 2   clotrimazole-betamethasone (LOTRISONE) cream, Apply 1-2 times a day for worsening flare intertrigo, may re-use daily up to 1 week as needed., Disp: 30 g, Rfl: 1   escitalopram (LEXAPRO) 10 MG tablet, Take 1 tablet (10 mg total) by mouth daily., Disp: 30 tablet, Rfl: 2   hydrochlorothiazide (HYDRODIURIL) 25 MG tablet, TAKE 1 TABLET BY MOUTH EVERY DAY, Disp: 90 tablet, Rfl: 3   ipratropium (ATROVENT) 0.06 % nasal spray, Place 2 sprays into both nostrils 4 (four) times daily. For up to 5-7 days then stop., Disp: 15 mL, Rfl: 0   meloxicam (MOBIC) 15 MG tablet, TAKE 1 TABLET (15 MG TOTAL) BY MOUTH AS NEEDED., Disp: 30 tablet, Rfl: 3   metFORMIN (GLUCOPHAGE) 500 MG tablet, TAKE 1 TABLET BY MOUTH 2 TIMES DAILY WITH A MEAL., Disp: 180 tablet, Rfl: 0  montelukast (SINGULAIR) 10 MG tablet, Take 1 tablet (10 mg total) by mouth at bedtime., Disp: 90 tablet, Rfl: 1   Multiple Vitamin (MULTIVITAMIN) LIQD, Take 5 mLs by mouth daily., Disp: , Rfl:    traZODone (DESYREL) 50 MG tablet, Take 1 tablet (50 mg total) by mouth at bedtime as needed for sleep., Disp: 30 tablet, Rfl: 2   triamcinolone cream (KENALOG) 0.1 %, Apply 1 application topically 2 (two) times daily as needed. For 1-2 weeks. Do not use on face., Disp: 30 g, Rfl: 0   Turmeric (QC TUMERIC  COMPLEX PO), Take 1,000 mg by mouth., Disp: , Rfl:    valACYclovir (VALTREX) 500 MG tablet, TAKE 1 TABLET (500 MG TOTAL) BY MOUTH 3 (THREE) TIMES DAILY AS NEEDED FOR FLARE, Disp: 270 tablet, Rfl: 0     08/18/2022    8:05 AM 01/24/2022    4:10 PM 08/30/2021   10:31 AM  Depression screen PHQ 2/9  Decreased Interest '2 1 1  '$ Down, Depressed, Hopeless 2 0 0  PHQ - 2 Score '4 1 1  '$ Altered sleeping 3 0 0  Tired, decreased energy '2 1 1  '$ Change in appetite 3 0 0  Feeling bad or failure about yourself  3 0 0  Trouble concentrating 2 0 0  Moving slowly or fidgety/restless 1 0 0  Suicidal thoughts 0 0 0  PHQ-9 Score '18 2 2  '$ Difficult doing work/chores Extremely dIfficult Not difficult at all Not difficult at all       08/18/2022    8:05 AM 01/24/2022    4:11 PM 08/30/2021   10:31 AM  GAD 7 : Generalized Anxiety Score  Nervous, Anxious, on Edge 3 0 0  Control/stop worrying 3 0 0  Worry too much - different things 3 0 0  Trouble relaxing 3 0 0  Restless 2 0 0  Easily annoyed or irritable '3 1 1  '$ Afraid - awful might happen 3 0 0  Total GAD 7 Score '20 1 1  '$ Anxiety Difficulty  Not difficult at all Not difficult at all    -------------------------------------------------------------------------- O: No physical exam performed due to remote telephone encounter.  Lab results reviewed.  No results found for this or any previous visit (from the past 2160 hour(s)).  -------------------------------------------------------------------------- A&P:  Problem List Items Addressed This Visit   None Visit Diagnoses     COVID-19 virus infection    -  Primary   Relevant Medications   nirmatrelvir/ritonavir EUA (PAXLOVID) 20 x 150 MG & 10 x '100MG'$  TABS   Cough due to bronchospasm       Relevant Medications   Albuterol Sulfate (PROAIR RESPICLICK) 676 (90 Base) MCG/ACT AEPB      COVID19 positive Symptom 1st onset today 11/14 Confirm home test positive today 11/14 Mild to moderate symptoms  currently. No red flags or dyspnea Risk factor age 61, Morbid Obesity PreDM   Start Paxlovid, GFR >60, 5 day course as prescribed, regular dosing with normal kidney function GFR >60. Reviewed med interaction checker without interactions Counseling provided Supportive care OTC PRN Follow-up criteria given.  No orders of the defined types were placed in this encounter.    Meds ordered this encounter  Medications   nirmatrelvir/ritonavir EUA (PAXLOVID) 20 x 150 MG & 10 x '100MG'$  TABS    Sig: Take 3 tablets by mouth 2 (two) times daily for 5 days. (Take nirmatrelvir 150 mg two tablets twice daily for 5 days and ritonavir 100 mg one tablet twice  daily for 5 days) Patient GFR is >60    Dispense:  30 tablet    Refill:  0   Albuterol Sulfate (PROAIR RESPICLICK) 030 (90 Base) MCG/ACT AEPB    Sig: Inhale 2 puffs into the lungs every 6 (six) hours as needed.    Dispense:  1 each    Refill:  0    Follow-up: PRN  Patient verbalizes understanding with the above medical recommendations including the limitation of remote medical advice.  Specific follow-up and call-back criteria were given for patient to follow-up or seek medical care more urgently if needed.   - Time spent in direct consultation with patient on phone: 7 minutes  Nobie Putnam, [facility] 08/29/2022, 4:32 PM

## 2022-08-30 MED ORDER — ALBUTEROL SULFATE HFA 108 (90 BASE) MCG/ACT IN AERS: 2.0000 | INHALATION_SPRAY | Freq: Four times a day (QID) | RESPIRATORY_TRACT | 0 refills | Status: DC | PRN

## 2022-08-31 ENCOUNTER — Other Ambulatory Visit: Payer: Self-pay | Admitting: Family Medicine

## 2022-08-31 ENCOUNTER — Ambulatory Visit: Payer: 59 | Admitting: Psychology

## 2022-08-31 DIAGNOSIS — Z1231 Encounter for screening mammogram for malignant neoplasm of breast: Secondary | ICD-10-CM

## 2022-08-31 DIAGNOSIS — F331 Major depressive disorder, recurrent, moderate: Secondary | ICD-10-CM

## 2022-08-31 NOTE — Progress Notes (Signed)
[place] Counselor/Therapist Progress Note  Patient ID: Kara Porter, MRN: [id number],    Date: 08/31/2022  Time Spent: 45 mins  Treatment Type: Individual Therapy  Reported Symptoms: Pt presents for the session via WebEx video; pt grants consent for the session, stating she is in her home with no one else present.  I shared with pt that I am in my office with no one else here either.  Mental Status Exam: Appearance:  Casual     Behavior: Appropriate  Motor: Normal  Speech/Language:  Clear and Coherent  Affect: Appropriate  Mood: depressed  Thought process: normal  Thought content:   WNL  Sensory/Perceptual disturbances:   WNL  Orientation: oriented to person, place, and time/date  Attention: Good  Concentration: Good  Memory: WNL  Fund of knowledge:  Good  Insight:   Good  Judgment:  Good  Impulse Control: Good   Risk Assessment: Danger to Self:  No Self-injurious Behavior: No Danger to Others: No Duty to Warn:no Physical Aggression / Violence:No  Access to Firearms a concern: No  Gang Involvement:No   Subjective: [place] has been [place] to [place] her father 565 yo) in the rehab facility due to pneumonia; he now has COVID and dementia as well.  It is hard for pt to see her dad in this condition; "I have been struggling so much with this so much this week.  I have been estranged from my father because he left our family and started a new family.  Now, that family is no where to be found."  Pt is waiting to see how her dad recovers to see if she can bring him back home to the assisted living facility [place] that she [place].  She was uncomfortable being in [place] by herself because how her dad's health unfolds.  She is not sure whether or dad will fully recover from [place], given his underlying pulmonary conditions.  Pt shares that she has been working on word search puzzles and would also like to get back to reading, when she can focus on it.  Talked again with pt  about self care activities and how beneficial they can be for pt.  Pt is planning to do Christmas differently this year; she wants to buy small presents for the family and friends and then purchase experiences for them "so that I can talk with my family and friends about their experiences."  Pt shares that she is going to allow her family members to make the decisions about whether or not she gets together with them for Thanksgiving, due to her COVID diagnosis this week.  Encouraged pt to be as intentional as possible about engaging in her self care activities between now and our follow up session in 2 wks, due to the holiday next week.  Interventions: Cognitive Behavioral Therapy  Diagnosis:Major depressive disorder, recurrent, moderate (HCC)  Plan: Treatment Plan Strengths/Abilities:  Intelligent, Intuitive, Willing to participate in therapy Treatment Preferences:  Outpatient Individual Therapy Statement of Needs:  Patient is to use CBT, mindfulness and coping skills to help manage and/or decrease symptoms associated with their diagnosis. Symptoms:  Depressed/Irritable mood, worry, social withdrawal Problems Addressed:  Depressive thoughts, Sadness, Sleep issues, etc. Long Term Goals:  Pt to reduce overall level, frequency, and intensity of the feelings of depression as evidenced by decreased irritability, negative self talk, and helpless feelings from 6 to 7 days/week to 0 to 1 days/week, per client report, for at least 3 consecutive months.  Progress: 10% Short Term Goals:  Pt to verbally express understanding of the relationship between feelings of depression and their impact on thinking patterns and behaviors.  Pt to verbalize an understanding of the role that distorted thinking plays in creating fears, excessive worry, and ruminations.  Progress: 10% Target Date:  09/01/2023 Frequency:  Bi-weekly Modality:  Cognitive Behavioral Therapy Interventions by Therapist:  Therapist will use CBT,  Mindfulness exercises, Coping skills and Referrals, as needed by client. Client has verbally approved this treatment plan.  Ivan Anchors, [facility]

## 2022-09-11 ENCOUNTER — Other Ambulatory Visit: Payer: Self-pay | Admitting: Family Medicine

## 2022-09-11 DIAGNOSIS — F410 Panic disorder [episodic paroxysmal anxiety] without agoraphobia: Secondary | ICD-10-CM

## 2022-09-11 DIAGNOSIS — F332 Major depressive disorder, recurrent severe without psychotic features: Secondary | ICD-10-CM

## 2022-09-12 NOTE — Telephone Encounter (Signed)
Refilled 08/18/2022 90 day supply Requested Prescriptions  Pending Prescriptions Disp Refills   escitalopram (LEXAPRO) 10 MG tablet [Pharmacy Med Name: ESCITALOPRAM 10 MG TABLET] 90 tablet 1    Sig: TAKE 1 TABLET BY MOUTH EVERY DAY     Psychiatry:  Antidepressants - SSRI Passed - 09/11/2022 12:31 PM      Passed - Completed PHQ-2 or PHQ-9 in the last 360 days      Passed - Valid encounter within last 6 months    Recent Outpatient Visits           2 weeks ago COVID-19 virus infection   [place], Devonne Doughty, DO   3 weeks ago Generalized anxiety disorder with panic attacks   [place], DO   7 months ago Acute non-recurrent frontal sinusitis   [place], DO   10 months ago Acute non-recurrent frontal sinusitis   [place], DO   10 months ago COVID-19 virus infection   [place], DO       Future Appointments             In 2 weeks [place], Devonne Doughty, [facility], [facility]             busPIRone (BUSPAR) 5 MG tablet [Pharmacy Med Name: BUSPIRONE HCL 5 MG TABLET] 270 tablet 1    Sig: TAKE 1 TABLET (5 MG TOTAL) BY MOUTH 3 (THREE) TIMES DAILY AS NEEDED (ANXIETY).     Psychiatry: Anxiolytics/Hypnotics - Non-controlled Passed - 09/11/2022 12:31 PM      Passed - Valid encounter within last 12 months    Recent Outpatient Visits           2 weeks ago COVID-19 virus infection   [facility] [place], Devonne Doughty, DO   3 weeks ago Generalized anxiety disorder with panic attacks   [place], DO   7 months ago Acute non-recurrent frontal sinusitis   [place], DO   10 months ago Acute non-recurrent frontal sinusitis   [place], DO   10 months ago COVID-19 virus infection   [place], DO       Future Appointments             In 2 weeks [place], Devonne Doughty, DO [facility], [facility]

## 2022-09-14 ENCOUNTER — Ambulatory Visit: Payer: 59 | Admitting: Psychology

## 2022-09-18 ENCOUNTER — Encounter: Payer: Managed Care, Other (non HMO) | Admitting: Family Medicine

## 2022-09-19 ENCOUNTER — Ambulatory Visit
Admission: RE | Admit: 2022-09-19 | Discharge: 2022-09-19 | Disposition: A | Discharge status: Home / Self Care | Payer: Managed Care, Other (non HMO) | Source: Ambulatory Visit | Attending: Family Medicine | Admitting: Family Medicine

## 2022-09-19 DIAGNOSIS — Z1231 Encounter for screening mammogram for malignant neoplasm of breast: Secondary | ICD-10-CM | POA: Diagnosis not present

## 2022-09-20 ENCOUNTER — Ambulatory Visit: Payer: 59 | Admitting: Psychology

## 2022-09-20 DIAGNOSIS — F331 Major depressive disorder, recurrent, moderate: Secondary | ICD-10-CM | POA: Diagnosis not present

## 2022-09-20 NOTE — Progress Notes (Signed)
[facility] Counselor/Therapist Progress Note  Patient ID: Kara Porter, MRN: [id number],    Date: 09/20/2022  Time Spent: 45 mins  Treatment Type: Individual Therapy  Reported Symptoms: Pt presents for the session via WebEx video; pt grants consent for the session, stating she is in her home with no one else present.  I shared with pt that I am in my office with no one else here either.  Mental Status Exam: Appearance:  Casual     Behavior: Appropriate  Motor: Normal  Speech/Language:  Clear and Coherent  Affect: Appropriate  Mood: depressed  Thought process: normal  Thought content:   WNL  Sensory/Perceptual disturbances:   WNL  Orientation: oriented to person, place, and time/date  Attention: Good  Concentration: Good  Memory: WNL  Fund of knowledge:  Good  Insight:   Good  Judgment:  Good  Impulse Control: Good   Risk Assessment: Danger to Self:  No Self-injurious Behavior: No Danger to Others: No Duty to Warn:no Physical Aggression / Violence:No  Access to Firearms a concern: No  Gang Involvement:No   Subjective: Pt shares that her dad is still in AL in a hospital and he is refusing food and has had a feeding tube inserted.  He has vascular dementia and is not competent to make his own decisions.  Pt shares that she is his power of attorney and has to make decisions for her dad.  "I am not doing well with this situation.  I have significant anxiety and depression about this situation.  I am glad that I am on leave from work right now because I could not do my job and this as well.  My depression is getting bad; I am not sleeping well and it is hard to eat right.  I am still working on the word search puzzles to try to focus my thoughts."  Pt shares that her original FMLA leave was partially approved; she has another appt with her PCP on 12/14 to request additional leave.  Pt shares that the holiday are her favorite time of year but not this year.  She had  to go back to [place] twice last week because of her dad's condition.  "I am just so tired right now but I am not sure what I am feeling because I feel so lost.  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  Pt shares she was considering these perspectives before taking her leave to care for her dad.  Pt shares that she did recently sell a home in [place] and can live off of those savings for a while.  Pt shares that she feels weak for feeling the way she is feeling.  Pt shares that she is over-analyzing every decision she has to make at this time.  Pt shares that her manager has not reached out to her at all during her leave "and that makes me feel some type of way."  Talked with pt about what she did for Thanksgiving; they, as a family, did get together but it was small and subdued because of what was going on with her father.  Encouraged pt to allow her family to help support her during  this difficult time.  Her daughters and her husband have been supportive of her and she appreciates that.  Encouraged pt to be as intentional as possible about engaging in her self care activities between now and our follow up session next week.  Interventions: Cognitive Behavioral Therapy  Diagnosis:Major depressive disorder, recurrent, moderate (HCC)  Plan: Treatment Plan Strengths/Abilities:  Intelligent, Intuitive, Willing to participate in therapy Treatment Preferences:  Outpatient Individual Therapy Statement of Needs:  Patient is to use CBT, mindfulness and coping skills to help manage and/or decrease symptoms associated with their diagnosis. Symptoms:  Depressed/Irritable mood, worry, social withdrawal Problems Addressed:  Depressive thoughts, Sadness, Sleep issues, etc. Long Term Goals:   Pt to reduce overall level, frequency, and intensity of the feelings of depression as evidenced by decreased irritability, negative self talk, and helpless feelings from 6 to 7 days/week to 0 to 1 days/week, per client report, for at least 3 consecutive months.  Progress: 10% Short Term Goals:  Pt to verbally express understanding of the relationship between feelings of depression and their impact on thinking patterns and behaviors.  Pt to verbalize an understanding of the role that distorted thinking plays in creating fears, excessive worry, and ruminations.  Progress: 10% Target Date:  09/01/2023 Frequency:  Bi-weekly Modality:  Cognitive Behavioral Therapy Interventions by Therapist:  Therapist will use CBT, Mindfulness exercises, Coping skills and Referrals, as needed by client. Client has verbally approved this treatment plan.  Ivan Anchors, [facility]

## 2022-09-22 ENCOUNTER — Other Ambulatory Visit: Payer: Self-pay | Admitting: Family Medicine

## 2022-09-22 DIAGNOSIS — J9801 Acute bronchospasm: Secondary | ICD-10-CM

## 2022-09-22 NOTE — Telephone Encounter (Signed)
Requested Prescriptions  Pending Prescriptions Disp Refills   albuterol (VENTOLIN HFA) 108 (90 Base) MCG/ACT inhaler [Pharmacy Med Name: ALBUTEROL HFA (PROAIR) INHALER] 8.5 each 2    Sig: TAKE 2 PUFFS BY MOUTH EVERY 6 HOURS AS NEEDED FOR WHEEZE OR SHORTNESS OF BREATH     Pulmonology:  Beta Agonists 2 Passed - 09/22/2022  2:33 AM      Passed - Last BP in normal range    BP Readings from Last 1 Encounters:  08/18/22 138/80         Passed - Last Heart Rate in normal range    Pulse Readings from Last 1 Encounters:  08/18/22 94         Passed - Valid encounter within last 12 months    Recent Outpatient Visits           3 weeks ago COVID-19 virus infection   [place], Devonne Doughty, DO   1 month ago Generalized anxiety disorder with panic attacks   [place], DO   8 months ago Acute non-recurrent frontal sinusitis   [place], DO   10 months ago Acute non-recurrent frontal sinusitis   [place]   11 months ago COVID-19 virus infection   [place], Devonne Doughty, DO       Future Appointments             In 6 days [place], Devonne Doughty, [facility], [facility]

## 2022-09-27 ENCOUNTER — Ambulatory Visit: Payer: 59 | Admitting: Psychology

## 2022-09-27 DIAGNOSIS — F331 Major depressive disorder, recurrent, moderate: Secondary | ICD-10-CM | POA: Diagnosis not present

## 2022-09-27 NOTE — Progress Notes (Signed)
[facility] Counselor/Therapist Progress Note  Patient ID: Kara Porter, MRN: [id number],    Date: 09/27/2022  Time Spent: 45 mins  Treatment Type: Individual Therapy  Reported Symptoms: Pt presents for the session via WebEx video; pt grants consent for the session, stating she is in her home with no one else present.  I shared with pt that I am in my office with no one else here either.  Mental Status Exam: Appearance:  Casual     Behavior: Appropriate  Motor: Normal  Speech/Language:  Clear and Coherent  Affect: Appropriate  Mood: depressed  Thought process: normal  Thought content:   WNL  Sensory/Perceptual disturbances:   WNL  Orientation: oriented to person, place, and time/date  Attention: Good  Concentration: Good  Memory: WNL  Fund of knowledge:  Good  Insight:   Good  Judgment:  Good  Impulse Control: Good   Risk Assessment: Danger to Self:  No Self-injurious Behavior: No Danger to Others: No Duty to Warn:no Physical Aggression / Violence:No  Access to Firearms a concern: No  Gang Involvement:No   Subjective: Pt shares that her dad is still in AL in a hospital and he now has a fever so he is not stable to transfer him to a NH.  Pt has not been able to find a NH home for him in this area but she was able to find a bed for him in [place], [place] where she used to live and he lived there as well.  He has a granddaughter still living there.  Pt shares, "last week was really not a good week for me.  Towards the end of the week, I tried to be more intentional about taking good care of myself."  She is not able to do as much for Christmas this year as she normally does.  She is trying to make sure she is kind to herself on this issue and take it easier on herself "for this year."  Pt thought about asking a couple of friends to come over to her home to visit; "I didn't follow through with it because my husband got sick."  Her husband is feeling a bit better and  returned to work today.  Pt is in her 6th week of her [employer] leave; she is to see her PCP tomorrow to extend her leave.  Pt shares that she had been feeling a significant amount of emotional heaviness that caused her to be afraid of her depression.  This has gotten some better but she still recognizes that she has improvement to make to return to her regular level of functioning.  Pt continues to take her medication and feels like she is finally seeing some benefit from it.  Pt shares she is still not sleeping well, due to the stress of worrying about her father.  She enjoys a 10 pm to 6 am sleep schedule but she was waking up at 3 am-330 am without being able to go back to sleep.  She feels like her anxious moments are lessening some as well.  She is wanting to get back to sleeping well because she is able to recognize the benefits of it.  Encouraged pt to continue to give herself grace during this time and to acknowledge that she is improving slowly.  Pt is looking forward to making steps forward and she wants to be able to set appropriate limits for herself moving forward.  Encouraged pt to be as intentional as possible about engaging  in her self care activities between now and our follow up session next week.  Interventions: Cognitive Behavioral Therapy  Diagnosis:Major depressive disorder, recurrent, moderate (HCC)  Plan: Treatment Plan Strengths/Abilities:  Intelligent, Intuitive, Willing to participate in therapy Treatment Preferences:  Outpatient Individual Therapy Statement of Needs:  Patient is to use CBT, mindfulness and coping skills to help manage and/or decrease symptoms associated with their diagnosis. Symptoms:  Depressed/Irritable mood, worry, social withdrawal Problems Addressed:  Depressive thoughts, Sadness, Sleep issues, etc. Long Term Goals:  Pt to reduce overall level, frequency, and intensity of the feelings of depression as evidenced by decreased irritability, negative self talk,  and helpless feelings from 6 to 7 days/week to 0 to 1 days/week, per client report, for at least 3 consecutive months.  Progress: 10% Short Term Goals:  Pt to verbally express understanding of the relationship between feelings of depression and their impact on thinking patterns and behaviors.  Pt to verbalize an understanding of the role that distorted thinking plays in creating fears, excessive worry, and ruminations.  Progress: 10% Target Date:  09/01/2023 Frequency:  Bi-weekly Modality:  Cognitive Behavioral Therapy Interventions by Therapist:  Therapist will use CBT, Mindfulness exercises, Coping skills and Referrals, as needed by client. Client has verbally approved this treatment plan.  Ivan Anchors, [facility]

## 2022-09-28 ENCOUNTER — Encounter: Payer: Self-pay | Admitting: Family Medicine

## 2022-09-28 ENCOUNTER — Other Ambulatory Visit: Payer: Self-pay | Admitting: Family Medicine

## 2022-09-28 ENCOUNTER — Ambulatory Visit: Payer: Managed Care, Other (non HMO) | Admitting: Family Medicine

## 2022-09-28 VITALS — BP 120/74 | HR 93 | Ht 64.0 in | Wt 240.0 lb

## 2022-09-28 DIAGNOSIS — F5104 Psychophysiologic insomnia: Secondary | ICD-10-CM | POA: Diagnosis not present

## 2022-09-28 DIAGNOSIS — R7303 Prediabetes: Secondary | ICD-10-CM

## 2022-09-28 DIAGNOSIS — F411 Generalized anxiety disorder: Secondary | ICD-10-CM

## 2022-09-28 DIAGNOSIS — E559 Vitamin D deficiency, unspecified: Secondary | ICD-10-CM

## 2022-09-28 DIAGNOSIS — E7800 Pure hypercholesterolemia, unspecified: Secondary | ICD-10-CM

## 2022-09-28 DIAGNOSIS — F332 Major depressive disorder, recurrent severe without psychotic features: Secondary | ICD-10-CM

## 2022-09-28 DIAGNOSIS — F410 Panic disorder [episodic paroxysmal anxiety] without agoraphobia: Secondary | ICD-10-CM

## 2022-09-28 DIAGNOSIS — Z0000 Encounter for general adult medical examination without abnormal findings: Secondary | ICD-10-CM

## 2022-09-28 MED ORDER — ESCITALOPRAM OXALATE 10 MG PO TABS: 10.0000 mg | ORAL_TABLET | Freq: Every day | ORAL | 2 refills | Status: DC

## 2022-09-28 NOTE — Patient Instructions (Addendum)
Thank you for coming to the office today.  Will complete the Disability form with plan to return to work 11/13/22, with no accommodations. After you have completed all FMLA time.  Please send me photos of previous paperwork on mychart  Keep on current medicine plan  Refilled Escitalopram lexapro  Keep up with behavioral health   DUE for FASTING BLOOD WORK (no food or drink after midnight before the lab appointment, only water or coffee without cream/sugar on the morning of)  SCHEDULE "Lab Only" visit in the morning at the clinic for lab draw in [place]   - Make sure Lab Only appointment is at about 1 week before your next appointment, so that results will be available  For Lab Results, once available within 2-3 days of blood draw, you can can log in to MyChart online to view your results and a brief explanation. Also, we can discuss results at next follow-up visit.   Please schedule a Follow-up Appointment to: Return in about 4 weeks (around 10/26/2022) for 4 weeks fasting lab only then 1 week later Annual Physical.  If you have any other questions or concerns, please feel free to call the office or send a message through [facility]. You may also schedule an earlier appointment if necessary.  Additionally, you may be receiving a survey about your experience at our office within a few days to 1 week by e-mail or mail. We value your feedback.  Nobie Putnam, DO [place]

## 2022-09-28 NOTE — Progress Notes (Addendum)
Subjective:    Patient ID: Kara Porter, female    DOB: June 24, 1961, 61 y.o.   MRN: [id number]  Kara Porter is a 60 y.o. female presenting on 09/28/2022 for Anxiety and Depression   HPI  Generalized Anxiety Disorder, with panic attacks Severe major depression recurrent Insomnia  Last visit 08/18/22 - she reports updates with anxiety has reduced and still present with driving. - She reports depression has been noticeably worse. She felt difficulty leaving house last week and felt like a "weight on her" like she couldn't be motivated. She felt like she was unable to push herself to improve. She feels more isolated overall. She feels not bored and enjoys being home and this does not seem to be a bad thing. She has felt some sense of accomplishment with being more productive at home.  - She continues to work with Dennison Bulla [facility]) therapist weekly, and has pursued therapy CBT and activities and assignments to work on self improvement. - Still difficulty with sleeping at night, may wake up 1-2 times a week with startled anxiety panic unable to fall back asleep. - She has done "green noise" with nature sounds. She said white noise has bothered her. - Taking Lexapro '10mg'$  daily - On occasion taking Buspar '5mg'$  THREE TIMES A DAY AS NEEDED, especially if wake up overnight - She has Trazodone '50mg'$  if need but very rarely takes  She does not feel capable to go back to work at this time. She is concerned about this in future.  History with Admits panic attacks while driving on interstate when driving worse scenario for her panic.  Currently still on FMLA continuous leave, will need update now 6 weeks later.      09/28/2022    9:31 AM 08/18/2022    8:05 AM 01/24/2022    4:10 PM  Depression screen PHQ 2/9  Decreased Interest '3 2 1  '$ Down, Depressed, Hopeless 2 2 0  PHQ - 2 Score '5 4 1  '$ Altered sleeping 3 3 0  Tired, decreased energy '3 2 1  '$ Change in appetite 2 3 0  Feeling  bad or failure about yourself  2 3 0  Trouble concentrating 2 2 0  Moving slowly or fidgety/restless 1 1 0  Suicidal thoughts 0 0 0  PHQ-9 Score '18 18 2  '$ Difficult doing work/chores Very difficult Extremely dIfficult Not difficult at all      09/28/2022    9:32 AM 08/18/2022    8:05 AM 01/24/2022    4:11 PM 08/30/2021   10:31 AM  GAD 7 : Generalized Anxiety Score  Nervous, Anxious, on Edge 2 3 0 0  Control/stop worrying 2 3 0 0  Worry too much - different things 2 3 0 0  Trouble relaxing 1 3 0 0  Restless 1 2 0 0  Easily annoyed or irritable '1 3 1 1  '$ Afraid - awful might happen 2 3 0 0  Total GAD 7 Score '11 20 1 1  '$ Anxiety Difficulty Somewhat difficult  Not difficult at all Not difficult at all      Social History   Tobacco Use   Smoking status: Former    Packs/day: 0.75    Years: 6.00    Total pack years: 4.50    Types: Cigarettes    Quit date: 2018    Years since quitting: 5.9   Smokeless tobacco: Former  Scientific laboratory technician Use: Never used  Substance Use Topics  Alcohol use: Not Currently   Drug use: Never    Review of Systems Per HPI unless specifically indicated above     Objective:    BP 120/74   Pulse 93   Ht '5\' 4"'$  (1.626 m)   Wt 240 lb (108.9 kg)   LMP 11/24/2017 (Exact Date)   SpO2 99%   BMI 41.20 kg/m   Wt Readings from Last 3 Encounters:  09/28/22 240 lb (108.9 kg)  08/18/22 238 lb (108 kg)  01/24/22 213 lb (96.6 kg)    Physical Exam Vitals and nursing note reviewed.  Constitutional:      General: She is not in acute distress.    Appearance: Normal appearance. She is well-developed. She is not diaphoretic.     Comments: Well-appearing, comfortable, cooperative  HENT:     Head: Normocephalic and atraumatic.  Eyes:     General:        Right eye: No discharge.        Left eye: No discharge.     Conjunctiva/sclera: Conjunctivae normal.  Cardiovascular:     Rate and Rhythm: Normal rate.  Pulmonary:     Effort: Pulmonary effort is  normal.  Skin:    General: Skin is warm and dry.     Findings: No erythema or rash.  Neurological:     Mental Status: She is alert and oriented to person, place, and time.  Psychiatric:        Mood and Affect: Mood normal.        Behavior: Behavior normal.        Thought Content: Thought content normal.     Comments: Well groomed, good eye contact, normal speech and thoughts. Anxious at times. Depressed mood. Not tearful. Some difficulty with sustained focus and concentration during history.      Results for orders placed or performed in visit on 08/30/21  HM PAP SMEAR  Result Value Ref Range   HM Pap smear Negative pap smear and HPV test       Assessment & Plan:   Problem List Items Addressed This Visit     Generalized anxiety disorder with panic attacks   Relevant Medications   escitalopram (LEXAPRO) 10 MG tablet   Psychophysiological insomnia - Primary   Other Visit Diagnoses     Severe episode of recurrent major depressive disorder, without psychotic features ([place])       Relevant Medications   escitalopram (LEXAPRO) 10 MG tablet       Depression and anxiety with panic significantly impacting daily function limiting her Insomnia secondary is reducing her limit and exacerbating her mental health  Interval improvement 6 weeks into her FMLA continuous leave and medication management treatment plan.  She has initiated Advertising [employer] Psychology for CBT [facility]enn[facility] weekly sessions with benefit. Will continue  Improved on SSRI. Continue Escitalopram '10mg'$  daily  Continue rare usage of AS NEEDED medications - Buspar '5mg'$  THREE TIMES A DAY AS NEEDED usually night time - Trazodone '50mg'$  AT NIGHT very rarely using  Medical leave of absence recommended given she cannot function at her job at this time due to mental health, now impacting her physical health.  She is not ready to return to work at this point 6 weeks into her leave.   Will complete FMLA paperwork for  extension, requesting additional 6 weeks, to complete the full 12 weeks leave  Leave Begin 08/18/22  Claim Reported 08/21/22  Leave Exhaustion 11/09/22  Estimated Return to Work without accommodations.  1//29/24  FMLA paperwork has been given to me today. Will review complete and sign and submit it once ready. It is due before 10/12/22    Meds ordered this encounter  Medications   escitalopram (LEXAPRO) 10 MG tablet    Sig: Take 1 tablet (10 mg total) by mouth daily.    Dispense:  30 tablet    Refill:  2    Add refills on file    Follow up plan: Return in about 4 weeks (around 10/26/2022) for 4 weeks fasting lab only then 1 week later Annual Physical.  Future labs 4 weeks  Nobie Putnam, DO [facility] 09/28/2022, 9:40 AM

## 2022-10-12 ENCOUNTER — Ambulatory Visit: Payer: 59 | Admitting: Psychology

## 2022-10-12 DIAGNOSIS — F331 Major depressive disorder, recurrent, moderate: Secondary | ICD-10-CM | POA: Diagnosis not present

## 2022-10-12 NOTE — Progress Notes (Signed)
[place] Counselor/Therapist Progress Note  Patient ID: Kara Porter, MRN: [id number],    Date: 10/12/2022  Time Spent: 45 mins  Treatment Type: Individual Therapy  Reported Symptoms: Pt presents for the session via WebEx video; pt grants consent for the session, stating she is in her home with no one else present.  I shared with pt that I am in my office with no one else here either.  Mental Status Exam: Appearance:  Casual     Behavior: Appropriate  Motor: Normal  Speech/Language:  Clear and Coherent  Affect: Appropriate  Mood: depressed  Thought process: normal  Thought content:   WNL  Sensory/Perceptual disturbances:   WNL  Orientation: oriented to person, place, and time/date  Attention: Good  Concentration: Good  Memory: WNL  Fund of knowledge:  Good  Insight:   Good  Judgment:  Good  Impulse Control: Good   Risk Assessment: Danger to Self:  No Self-injurious Behavior: No Danger to Others: No Duty to Warn:no Physical Aggression / Violence:No  Access to Firearms a concern: No  Gang Involvement:No   Subjective: Pt shares that her dad is now in a NH in [place], [place]; "I feel some weight lifted because I have had good contact with the staff at the facility.  We got him transferred on the Friday before Christmas."  Pt shares that her dad is also now in Hospice care now since he is non-verbal at this point.  Pt shares that "Christmas was low key but positive."  Pt shares that she did wake up about 230 am the other night and had a panic attack that night and has started taking Melatonin to help her sleep.  Pt shares that this week she has felt better this week about being able to focus on her needs and what she needs to do; she has been going through a stack of mail that she needs to get through.  Pt has also been looking at the local [employer] and other gyms to decide which would be best for her and her husband.  Pt has also made her appt for her physical for 2024  and she is looking into going to the eye doctor as well.  Her mom gave her a gift card for a massage and pt is looking at dates that she can go get it done for herself.  Pt is planning to return to work on 1/29 and is trying to get things done before she returns to work.  She wants to commit to working for 30 days to see how it feels for her.  Pt shares that she feels like she was taking her work too seriously and she was getting overwhelmed by it.  Encouraged pt to consider what her work/life balance should look like when she returns to work.  Her PCP did submit the paperwork for the second half of her FMLA leave and that has been approved with pay.  Pt continues to take her medication and feels like she is finally seeing some benefit from it.  Pt shares she is still not sleeping well, due to the stress of worrying about her father.  She enjoys a 10 pm to 6 am sleep schedule but she was waking up at 3 am-330 am without being able to go back to sleep.  She feels like her anxious moments are lessening some as well.  She is wanting to get back to sleeping well because she is able to recognize the benefits of it.  Encouraged pt to continue to give herself grace during this time and to acknowledge that she is improving slowly.  Pt is looking forward to making steps forward and she wants to be able to set appropriate limits for herself moving forward.  Encouraged pt to be as intentional as possible about engaging in her self care activities between now and our follow up session next week.  Interventions: Cognitive Behavioral Therapy  Diagnosis:Major depressive disorder, recurrent, moderate (HCC)  Plan: Treatment Plan Strengths/Abilities:  Intelligent, Intuitive, Willing to participate in therapy Treatment Preferences:  Outpatient Individual Therapy Statement of Needs:  Patient is to use CBT, mindfulness and coping skills to help manage and/or decrease symptoms associated with their diagnosis. Symptoms:   Depressed/Irritable mood, worry, social withdrawal Problems Addressed:  Depressive thoughts, Sadness, Sleep issues, etc. Long Term Goals:  Pt to reduce overall level, frequency, and intensity of the feelings of depression as evidenced by decreased irritability, negative self talk, and helpless feelings from 6 to 7 days/week to 0 to 1 days/week, per client report, for at least 3 consecutive months.  Progress: 10% Short Term Goals:  Pt to verbally express understanding of the relationship between feelings of depression and their impact on thinking patterns and behaviors.  Pt to verbalize an understanding of the role that distorted thinking plays in creating fears, excessive worry, and ruminations.  Progress: 10% Target Date:  09/01/2023 Frequency:  Bi-weekly Modality:  Cognitive Behavioral Therapy Interventions by Therapist:  Therapist will use CBT, Mindfulness exercises, Coping skills and Referrals, as needed by client. Client has verbally approved this treatment plan.  Ivan Anchors, [facility]

## 2022-10-25 ENCOUNTER — Encounter: Payer: Managed Care, Other (non HMO) | Admitting: Family Medicine

## 2022-10-26 ENCOUNTER — Ambulatory Visit (INDEPENDENT_AMBULATORY_CARE_PROVIDER_SITE_OTHER): Payer: 59 | Admitting: Psychology

## 2022-10-26 DIAGNOSIS — F331 Major depressive disorder, recurrent, moderate: Secondary | ICD-10-CM

## 2022-10-26 NOTE — Progress Notes (Signed)
[place] Counselor/Therapist Progress Note  Patient ID: Shamonique Battiste, MRN: [id number],    Date: 10/26/2022  Time Spent: 45 mins  Treatment Type: Individual Therapy  Reported Symptoms: Pt presents for the session via WebEx video; pt grants consent for the session, stating she is in her home with no one else present.  I shared with pt that I am in my office with no one else here either.  Mental Status Exam: Appearance:  Casual     Behavior: Appropriate  Motor: Normal  Speech/Language:  Clear and Coherent  Affect: Appropriate  Mood: depressed  Thought process: normal  Thought content:   WNL  Sensory/Perceptual disturbances:   WNL  Orientation: oriented to person, place, and time/date  Attention: Good  Concentration: Good  Memory: WNL  Fund of knowledge:  Good  Insight:   Good  Judgment:  Good  Impulse Control: Good   Risk Assessment: Danger to Self:  No Self-injurious Behavior: No Danger to Others: No Duty to Warn:no Physical Aggression / Violence:No  Access to Firearms a concern: No  Gang Involvement:No   Subjective: Pt shares that "I am not so good.  My dad just passed on 01/30/2023.  It was a lot to do and my mom also had some issues with her own pacemaker during that time.  I felt like I did a good job managing everything."  Pt shares she that she tried to do things that she knew it was necessary to take care of herself while she was in [place] attending to her father.  Congratulated pt on focusing on taking care of her needs in this time; encouraged her to be intentional about continuing to do so moving forward.  The memorial services will be 01-30-23 and Wednesday next week in [place].  Encouraged pt to take her bereavement leave before she returns to work.  Pt shares she continues to use Melatonin for sleep and it is going OK; "I am only sleeping about 7 hrs but I am hopeful it will continue to get better."  Pt is scheduled for her massage on 1/22.  [facility] and she is happy with that choice.  Pt is planning to return to work on 1/29 and is trying to get things done before she returns to work.  She wants to commit to working for 30 days to see how it feels for her.  Encouraged pt to consider what her work/life balance should look like when she returns to work.  Pt continues to take her medication and feels like she is finally seeing some benefit from it.  Encouraged pt to continue to give herself grace during this time and to acknowledge that she is improving slowly.  Encouraged pt to be as intentional as possible about engaging in her self care activities between now and our follow up session in 2 weeks.  Interventions: Cognitive Behavioral Therapy  Diagnosis:Major depressive disorder, recurrent, moderate (HCC)  Plan: Treatment Plan Strengths/Abilities:  Intelligent, Intuitive, Willing to participate in therapy Treatment Preferences:  Outpatient Individual Therapy Statement of Needs:  Patient is to use CBT, mindfulness and coping skills to help manage and/or decrease symptoms associated with their diagnosis. Symptoms:  Depressed/Irritable mood, worry, social withdrawal Problems Addressed:  Depressive thoughts, Sadness, Sleep issues, etc. Long Term Goals:  Pt to reduce overall level, frequency, and intensity of the feelings of depression as evidenced by decreased irritability, negative self talk, and helpless feelings from 6 to 7 days/week to  0 to 1 days/week, per client report, for at least 3 consecutive months.  Progress: 10% Short Term Goals:  Pt to verbally express understanding of the relationship between feelings of depression and their impact on thinking patterns and behaviors.  Pt to verbalize an understanding of the role that distorted thinking plays in creating fears, excessive worry, and ruminations.  Progress: 10% Target Date:  09/01/2023 Frequency:  Bi-weekly Modality:  Cognitive Behavioral Therapy Interventions by  Therapist:  Therapist will use CBT, Mindfulness exercises, Coping skills and Referrals, as needed by client. Client has verbally approved this treatment plan.  Ivan Anchors, [facility]

## 2022-10-28 ENCOUNTER — Other Ambulatory Visit: Payer: Self-pay | Admitting: Family Medicine

## 2022-10-28 DIAGNOSIS — B009 Herpesviral infection, unspecified: Secondary | ICD-10-CM

## 2022-10-30 NOTE — Telephone Encounter (Signed)
Requested Prescriptions  Pending Prescriptions Disp Refills   valACYclovir (VALTREX) 500 MG tablet [Pharmacy Med Name: VALACYCLOVIR HCL 500 MG TABLET] 270 tablet 0    Sig: TAKE 1 TABLET (500 MG TOTAL) BY MOUTH 3 (THREE) TIMES DAILY AS NEEDED FOR FLARE     Antimicrobials:  Antiviral Agents - Anti-Herpetic Passed - 10/28/2022  8:32 AM      Passed - Valid encounter within last 12 months    Recent Outpatient Visits           1 month ago Generalized anxiety disorder with panic attacks   [place], DO   2 months ago COVID-19 virus infection   [place], DO   2 months ago Generalized anxiety disorder with panic attacks   [place], DO   9 months ago Acute non-recurrent frontal sinusitis   [place], DO   11 months ago Acute non-recurrent frontal sinusitis   [place], DO       Future Appointments             In 1 week Parks Ranger, Devonne Doughty, DO [facility], [facility]

## 2022-11-02 ENCOUNTER — Encounter: Payer: Managed Care, Other (non HMO) | Admitting: Family Medicine

## 2022-11-07 ENCOUNTER — Ambulatory Visit (INDEPENDENT_AMBULATORY_CARE_PROVIDER_SITE_OTHER): Payer: Managed Care, Other (non HMO) | Admitting: Family Medicine

## 2022-11-07 ENCOUNTER — Other Ambulatory Visit: Payer: Self-pay | Admitting: Family Medicine

## 2022-11-07 ENCOUNTER — Encounter: Payer: Self-pay | Admitting: Family Medicine

## 2022-11-07 VITALS — BP 124/78 | HR 73 | Ht 64.0 in | Wt 242.0 lb

## 2022-11-07 DIAGNOSIS — E559 Vitamin D deficiency, unspecified: Secondary | ICD-10-CM

## 2022-11-07 DIAGNOSIS — J3089 Other allergic rhinitis: Secondary | ICD-10-CM

## 2022-11-07 DIAGNOSIS — Z0000 Encounter for general adult medical examination without abnormal findings: Secondary | ICD-10-CM

## 2022-11-07 DIAGNOSIS — Z808 Family history of malignant neoplasm of other organs or systems: Secondary | ICD-10-CM

## 2022-11-07 DIAGNOSIS — Z1211 Encounter for screening for malignant neoplasm of colon: Secondary | ICD-10-CM | POA: Diagnosis not present

## 2022-11-07 DIAGNOSIS — F411 Generalized anxiety disorder: Secondary | ICD-10-CM

## 2022-11-07 DIAGNOSIS — R609 Edema, unspecified: Secondary | ICD-10-CM

## 2022-11-07 DIAGNOSIS — F410 Panic disorder [episodic paroxysmal anxiety] without agoraphobia: Secondary | ICD-10-CM

## 2022-11-07 DIAGNOSIS — Z1283 Encounter for screening for malignant neoplasm of skin: Secondary | ICD-10-CM

## 2022-11-07 DIAGNOSIS — B372 Candidiasis of skin and nail: Secondary | ICD-10-CM

## 2022-11-07 DIAGNOSIS — E538 Deficiency of other specified B group vitamins: Secondary | ICD-10-CM

## 2022-11-07 DIAGNOSIS — F331 Major depressive disorder, recurrent, moderate: Secondary | ICD-10-CM

## 2022-11-07 DIAGNOSIS — R7303 Prediabetes: Secondary | ICD-10-CM

## 2022-11-07 DIAGNOSIS — M48061 Spinal stenosis, lumbar region without neurogenic claudication: Secondary | ICD-10-CM

## 2022-11-07 DIAGNOSIS — E7800 Pure hypercholesterolemia, unspecified: Secondary | ICD-10-CM

## 2022-11-07 DIAGNOSIS — F332 Major depressive disorder, recurrent severe without psychotic features: Secondary | ICD-10-CM

## 2022-11-07 MED ORDER — HYDROCHLOROTHIAZIDE 25 MG PO TABS: 25.0000 mg | ORAL_TABLET | Freq: Every day | ORAL | 3 refills | Status: DC

## 2022-11-07 MED ORDER — NYSTATIN 100000 UNIT/GM EX POWD: 1.0000 | Freq: Three times a day (TID) | CUTANEOUS | 2 refills | Status: DC | PRN

## 2022-11-07 MED ORDER — MONTELUKAST SODIUM 10 MG PO TABS: 10.0000 mg | ORAL_TABLET | Freq: Every day | ORAL | 3 refills | Status: DC

## 2022-11-07 MED ORDER — ESCITALOPRAM OXALATE 10 MG PO TABS: 10.0000 mg | ORAL_TABLET | Freq: Every day | ORAL | 2 refills | Status: DC

## 2022-11-07 MED ORDER — METFORMIN HCL 500 MG PO TABS: 500.0000 mg | ORAL_TABLET | Freq: Two times a day (BID) | ORAL | 3 refills | Status: DC

## 2022-11-07 MED ORDER — MELOXICAM 15 MG PO TABS: 15.0000 mg | ORAL_TABLET | ORAL | 3 refills | Status: DC | PRN

## 2022-11-07 NOTE — Assessment & Plan Note (Signed)
Previously A1c 5.7 improved Concern with obesity, HLD Remains off GLP1  Plan:  1. May keep Metformin 500 BID  2. Encourage improved lifestyle - low carb, low sugar diet, reduce portion size, continue improving regular exercise  Check lab A1c

## 2022-11-07 NOTE — Patient Instructions (Addendum)
Thank you for coming to the office today.  Labs today  Refills sent in.  Use topical nystatin 2-3 times a day for 1-2 weeks or until resolved  Referral  [facility] Dermatologist in [place], [place] Address: [address] [place], [place] [address] Phone: [phone]  If you prefer to phase down on the Lexapro '10mg'$  after 6+ weeks, can phase down to every other day can do whole tab 10 and then next day half '5mg'$ . Then down to '5mg'$  daily, then down to '5mg'$  every other. 2-3 weeks to phase off.  Check Zepbound (Tirzepatide, Mounjaro) weekly injection for Weight Loss only. If it will be covered once we authorize it and if it is on formulary / preferred, find out potential cost if you can and we can pursue ordering. Just let me know.  Starting 2.'5mg'$  weekly, for 4 weeks, then increase every 4 weeks, to '5mg'$  to 7.'5mg'$  to 10+  --------------  Ordered the Cologuard (home kit) test for colon cancer screening. Stay tuned for further updates.  It will be shipped to you directly. If not received in 2-4 weeks, call us or the company.   If you send it back and no results are received in 2-4 weeks, call us or the company as well!   Colon Cancer Screening: - For all adults age 37+ routine colon cancer screening is highly recommended.     - Recent guidelines from [place] recommend starting age of 51 - Early detection of colon cancer is important, because often there are no warning signs or symptoms, also if found early usually it can be cured. Late stage is hard to treat.   - If Cologuard is NEGATIVE, then it is good for 3 years before next due - If Cologuard is POSITIVE, then it is strongly advised to get a Colonoscopy, which allows the GI doctor to locate the source of the cancer or polyp (even very early stage) and treat it by removing it. ------------------------- Follow instructions to collect sample, you may call the company for any help or questions, 24/7 telephone  support at [phone].    Please schedule a Follow-up Appointment to: Return in about 3 months (around 02/06/2023) for 3-6 months PreDM A1c, Weight, Anxiety, depending on progress.  If you have any other questions or concerns, please feel free to call the office or send a message through [place]. You may also schedule an earlier appointment if necessary.  Additionally, you may be receiving a survey about your experience at our office within a few days to 1 week by e-mail or mail. We value your feedback.  Nobie Putnam, DO [place]

## 2022-11-07 NOTE — Progress Notes (Signed)
Subjective:    Patient ID: Kara Porter, female    DOB: [birth year], 62 y.o.   MRN: [id number]  Kara Porter is a 62 y.o. female presenting on 11/07/2022 for Annual Exam   HPI   Here for Annual Physical and Lab Review.  Upcoming Return to work date Monday 11/13/22 from [facility] / Disability paperwork already submitted She has contacted work and is ready to return.  Generalized Anxiety Disorder, with panic attacks Severe major depression recurrent Insomnia   Last visit 09/28/22 she was continued on current treatment plan course with Lexapro SSRI and Psychology CBT. She was using AS NEEDED meds less buspar, trazodone.  - She continues to work with Dennison Bulla [facility]) therapist weekly, and has pursued therapy CBT and activities and assignments to work on self improvement.  Improved / reduced anxiety panic, insomnia.  - Still Taking Lexapro '10mg'$  daily. She asks for renewal today and will consider taking for few more weeks to months and consider taper off  - On occasion taking Buspar '5mg'$  THREE TIMES A DAY AS NEEDED, especially if wake up overnight - She has Trazodone '50mg'$  if need but very rarely takes    History with Admits panic attacks while driving on interstate when driving worse scenario for her panic.    Wellness / Pre-Diabetes / Morbid Obesity BMI >41 CBGs: Not checking sugar Meds: Metformin '500mg'$  TWICE A DAY IR - Off Ozempic/Wegovy previously Weight has maintained. Has Metformin IR '500mg'$  for PreDm Needs re order today. Now taking Meloxicam / Ibuprofen PRN only. Limiting these.   Vit D Deficiency Taking VIt D3, asking about repeat test   HYPERLIPIDEMIA: - Reports no concerns. Last lipid 2022, elevated LDL 140 stable to improved - Not on cholesterol medicine Due for lipid panel   She has started following with Chiropractor and has had major improvement with inflammation, constipation, and improved w/ accupuncture   History of suspected sleep apnea -  last eval 02/2021 but she could not afford the higher deductible cost of sleep study.      PMH - IBS (see prior notes for background), remains chronic problem. Improving diet. Former Smoker   Lumbar Spinal Stenosis - Followed by Orthopedics, [place].    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  Candidal Intertrigo skin rash Reports symptoms with localized skin rash irritation between breasts / skin fold and has mole in area.    Health Maintenance:      Due Flu / TDap - declines today   Prior colonoscopy, [facility] - no record received, initial colonoscopy age 38, no polyps. Now completed Cologuard 11/17/19 - NEGATIVE, good for 3 years - She is DUE today for repeat screening, Cologuard, will order. She will consider Colonoscopy next age 35+   F/u with OBGYN for Mammogram - last done 09/19/22   Updated last pap smear 08/04/21 [facility] - negative results including HPV, see results below     11/07/2022    8:06 AM 09/28/2022    9:31 AM 08/18/2022    8:05 AM  Depression screen PHQ 2/9  Decreased Interest '2 3 2  '$ Down, Depressed, Hopeless '1 2 2  '$ PHQ - 2 Score '3 5 4  '$ Altered sleeping '1 3 3  '$ Tired, decreased energy '3 3 2  '$ Change in appetite '2 2 3  '$ Feeling bad or failure about yourself  '1 2 3  '$ Trouble concentrating '1 2 2  '$ Moving slowly or fidgety/restless '1 1 1  '$ Suicidal thoughts 0  0 0  PHQ-9 Score '12 18 18  '$ Difficult doing work/chores Very difficult Very difficult Extremely dIfficult      11/07/2022    8:06 AM 09/28/2022    9:32 AM 08/18/2022    8:05 AM 01/24/2022    4:11 PM  GAD 7 : Generalized Anxiety Score  Nervous, Anxious, on Edge '1 2 3 '$ 0  Control/stop worrying '1 2 3 '$ 0  Worry too much - different things '1 2 3 '$ 0  Trouble relaxing '1 1 3 '$ 0  Restless 0 1 2 0  Easily annoyed or irritable '2 1 3 1  '$ Afraid - awful might happen '1 2 3 '$ 0  Total GAD 7 Score '7 11 20 1  '$ Anxiety Difficulty Somewhat difficult Somewhat difficult  Not difficult at all     Past Medical  History:  Diagnosis Date   Irritable bowel syndrome 2019   Past Surgical History:  Procedure Laterality Date   [place]   HYSTEROSCOPY  2018   Social History   Socioeconomic History   Marital status: Married    Spouse name: Not on file   Number of children: 2   Years of education: College   Highest education level: Bachelor's degree (e.g., BA, AB, BS)  Occupational History   Occupation: HR Manager    Comment: [employer]  Tobacco Use   Smoking status: Former    Packs/day: 0.75    Years: 6.00    Total pack years: 4.50    Types: Cigarettes    Quit date: 2018    Years since quitting: 6.0   Smokeless tobacco: Former  Scientific laboratory technician Use: Never used  Substance and Sexual Activity   Alcohol use: Not Currently   Drug use: Never   Sexual activity: Not Currently    Birth control/protection: None  Other Topics Concern   Not on file  Social History Narrative   Not on file   Social Determinants of Health   Financial Resource Strain: Not on file  Food Insecurity: Not on file  Transportation Needs: Not on file  Physical Activity: Unknown (07/24/2018)   Exercise Vital Sign    Days of Exercise per Week: 5 days    Minutes of Exercise per Session: Not on file  Stress: Stress Concern Present (07/24/2018)   [place]    Feeling of Stress : Rather much  Social Connections: Unknown (07/24/2018)   Social Connection and Isolation Panel [NHANES]    Frequency of Communication with Friends and Family: Three times a week    Frequency of Social Gatherings with Friends and Family: Three times a week    Attends Religious Services: Not on file    Active Member of Clubs or Organizations: Not on file    Attends Club or Organization Meetings: Not on file    Marital Status: Married  Intimate Partner Violence: Not At Risk (07/24/2018)   Humiliation, Afraid, Rape, and Kick  questionnaire    Fear of Current or Ex-Partner: No    Emotionally Abused: No    Physically Abused: No    Sexually Abused: No   Family History  Problem Relation Age of Onset   Hyperlipidemia Mother    Heart disease Mother    Cancer Father    Hyperlipidemia Father    Breast cancer Neg Hx    Adrenal disorder Neg Hx    Current Outpatient Medications on File Prior to  Visit  Medication Sig   albuterol (VENTOLIN HFA) 108 (90 Base) MCG/ACT inhaler TAKE 2 PUFFS BY MOUTH EVERY 6 HOURS AS NEEDED FOR WHEEZE OR SHORTNESS OF BREATH   busPIRone (BUSPAR) 5 MG tablet Take 1 tablet (5 mg total) by mouth 3 (three) times daily as needed (anxiety).   cholecalciferol (VITAMIN D3) 25 MCG (1000 UNIT) tablet Take 1,000 Units by mouth daily.   clotrimazole-betamethasone (LOTRISONE) cream Apply 1-2 times a day for worsening flare intertrigo, may re-use daily up to 1 week as needed.   Fiber Adult Gummies 2 g CHEW    ipratropium (ATROVENT) 0.06 % nasal spray Place 2 sprays into both nostrils 4 (four) times daily. For up to 5-7 days then stop.   Multiple Vitamin (MULTIVITAMIN) LIQD Take 5 mLs by mouth daily.   traZODone (DESYREL) 50 MG tablet Take 1 tablet (50 mg total) by mouth at bedtime as needed for sleep.   triamcinolone cream (KENALOG) 0.1 % Apply 1 application topically 2 (two) times daily as needed. For 1-2 weeks. Do not use on face.   Turmeric (QC TUMERIC COMPLEX PO) Take 1,000 mg by mouth.   valACYclovir (VALTREX) 500 MG tablet TAKE 1 TABLET (500 MG TOTAL) BY MOUTH 3 (THREE) TIMES DAILY AS NEEDED FOR FLARE   No current facility-administered medications on file prior to visit.    Review of Systems  Constitutional:  Negative for activity change, appetite change, chills, diaphoresis, fatigue and fever.  HENT:  Negative for congestion and hearing loss.   Eyes:  Negative for visual disturbance.  Respiratory:  Negative for cough, chest tightness, shortness of breath and wheezing.   Cardiovascular:   Negative for chest pain, palpitations and leg swelling.  Gastrointestinal:  Negative for abdominal pain, constipation, diarrhea, nausea and vomiting.  Genitourinary:  Negative for dysuria, frequency and hematuria.  Musculoskeletal:  Negative for arthralgias and neck pain.  Skin:  Negative for rash.  Neurological:  Negative for dizziness, weakness, light-headedness, numbness and headaches.  Hematological:  Negative for adenopathy.  Psychiatric/Behavioral:  Negative for behavioral problems, dysphoric mood and sleep disturbance.    Per HPI unless specifically indicated above      Objective:    BP 124/78   Pulse 73   Ht '5\' 4"'$  (1.626 m)   Wt 242 lb (109.8 kg)   LMP 11/24/2017 (Exact Date)   SpO2 98%   BMI 41.54 kg/m   Wt Readings from Last 3 Encounters:  11/07/22 242 lb (109.8 kg)  09/28/22 240 lb (108.9 kg)  08/18/22 238 lb (108 kg)    Physical Exam Vitals and nursing note reviewed.  Constitutional:      General: She is not in acute distress.    Appearance: She is well-developed. She is not diaphoretic.     Comments: Well-appearing, comfortable, cooperative  HENT:     Head: Normocephalic and atraumatic.  Eyes:     General:        Right eye: No discharge.        Left eye: No discharge.     Conjunctiva/sclera: Conjunctivae normal.     Pupils: Pupils are equal, round, and reactive to light.  Neck:     Thyroid: No thyromegaly.     Vascular: No carotid bruit.  Cardiovascular:     Rate and Rhythm: Normal rate and regular rhythm.     Pulses: Normal pulses.     Heart sounds: Normal heart sounds. No murmur heard. Pulmonary:     Effort: Pulmonary effort is normal. No respiratory  distress.     Breath sounds: Normal breath sounds. No wheezing or rales.  Abdominal:     General: Bowel sounds are normal. There is no distension.     Palpations: Abdomen is soft. There is no mass.     Tenderness: There is no abdominal tenderness.  Musculoskeletal:        General: No tenderness.  Normal range of motion.     Cervical back: Normal range of motion and neck supple.     Right lower leg: No edema.     Left lower leg: No edema.     Comments: Upper / Lower Extremities: - Normal muscle tone, strength bilateral upper extremities 5/5, lower extremities 5/5  Lymphadenopathy:     Cervical: No cervical adenopathy.  Skin:    General: Skin is warm and dry.     Findings: No erythema or rash.  Neurological:     Mental Status: She is alert and oriented to person, place, and time.     Comments: Distal sensation intact to light touch all extremities  Psychiatric:        Mood and Affect: Mood normal.        Behavior: Behavior normal.        Thought Content: Thought content normal.     Comments: Well groomed, good eye contact, normal speech and thoughts      Results for orders placed or performed in visit on 08/30/21  HM PAP SMEAR  Result Value Ref Range   HM Pap smear Negative pap smear and HPV test       Assessment & Plan:   Problem List Items Addressed This Visit     Fluid retention   Relevant Medications   hydrochlorothiazide (HYDRODIURIL) 25 MG tablet   Generalized anxiety disorder with panic attacks   Relevant Medications   escitalopram (LEXAPRO) 10 MG tablet   Hypercholesteremia    Check fasting lipid panel      Relevant Medications   hydrochlorothiazide (HYDRODIURIL) 25 MG tablet   Major depressive disorder, recurrent, moderate (HCC)   Relevant Medications   escitalopram (LEXAPRO) 10 MG tablet   Morbid obesity (HCC)    BMI >40, weight gain Reconsider options in future AVS info on Zepbound GLP1/GIP if interested and if covered for future consideration      Relevant Medications   metFORMIN (GLUCOPHAGE) 500 MG tablet   Pre-diabetes    Previously A1c 5.7 improved Concern with obesity, HLD Remains off GLP1  Plan:  1. May keep Metformin 500 BID  2. Encourage improved lifestyle - low carb, low sugar diet, reduce portion size, continue improving  regular exercise  Check lab A1c      Relevant Medications   metFORMIN (GLUCOPHAGE) 500 MG tablet   Spinal stenosis of lumbar region without neurogenic claudication   Relevant Medications   meloxicam (MOBIC) 15 MG tablet   Other Visit Diagnoses     Annual physical exam    -  Primary   Screening for colon cancer       Relevant Orders   Cologuard   Vitamin B12 nutritional deficiency       Relevant Orders   Vitamin B12   Vitamin D deficiency       Severe episode of recurrent major depressive disorder, without psychotic features (HCC)       Relevant Medications   escitalopram (LEXAPRO) 10 MG tablet   Environmental and seasonal allergies       Relevant Medications   montelukast (SINGULAIR) 10 MG tablet  Candidal intertrigo       Relevant Medications   nystatin (MYCOSTATIN/NYSTOP) powder   Screening for skin cancer       Relevant Orders   Ambulatory referral to Dermatology   Family history of nonmelanoma skin cancer       Relevant Orders   Ambulatory referral to Dermatology       Updated Health Maintenance information Fasting labs today, pending results. Encouraged improvement to lifestyle with diet and exercise Goal of weight loss   Significantly improved - Depression and anxiety with panic Insomnia secondary   Completed overall 12 week course disability FMLA  She is ready to return to work Mon 11/13/22   Continue SSRI Escitalopram '10mg'$  daily, refill, may taper down and off in future if ready.  Continue therapist [facility] for CBT Dennison Bulla [facility] weekly sessions with benefit. Will continue   Continue rare usage of AS NEEDED medications - Buspar '5mg'$  THREE TIMES A DAY AS NEEDED usually night time - Trazodone '50mg'$  AT NIGHT very rarely using    #Dermatology screening Referral to [facility]  #Candidal Intertrigo between breast - trial topical Nystatin powder.  Due for routine colon cancer screening. Prior Colonoscopy age 27, now has done well  with Cologuard, agrees for repeat. - Discussion today about recommendations for either Colonoscopy or Cologuard screening, benefits and risks of screening, interested in Cologuard, understands that if positive then recommendation is for diagnostic colonoscopy to follow-up. - Ordered Cologuard today Future colonoscopy age 33+  Meds ordered this encounter  Medications   hydrochlorothiazide (HYDRODIURIL) 25 MG tablet    Sig: Take 1 tablet (25 mg total) by mouth daily.    Dispense:  90 tablet    Refill:  3   meloxicam (MOBIC) 15 MG tablet    Sig: Take 1 tablet (15 mg total) by mouth as needed.    Dispense:  30 tablet    Refill:  3   escitalopram (LEXAPRO) 10 MG tablet    Sig: Take 1 tablet (10 mg total) by mouth daily.    Dispense:  30 tablet    Refill:  2    Add refills on file   metFORMIN (GLUCOPHAGE) 500 MG tablet    Sig: Take 1 tablet (500 mg total) by mouth 2 (two) times daily with a meal.    Dispense:  180 tablet    Refill:  3   montelukast (SINGULAIR) 10 MG tablet    Sig: Take 1 tablet (10 mg total) by mouth at bedtime.    Dispense:  90 tablet    Refill:  3   nystatin (MYCOSTATIN/NYSTOP) powder    Sig: Apply 1 Application topically 3 (three) times daily as needed (candidal rash). For 1-2 weeks or until resolved    Dispense:  30 g    Refill:  2     Follow up plan: Return in about 3 months (around 02/06/2023) for 3-6 months PreDM A1c, Weight, Anxiety, depending on progress.    Nobie Putnam, [facility] 11/07/2022, 8:10 AM

## 2022-11-07 NOTE — Assessment & Plan Note (Signed)
Check fasting lipid panel

## 2022-11-07 NOTE — Assessment & Plan Note (Signed)
BMI >40, weight gain Reconsider options in future AVS info on Zepbound GLP1/GIP if interested and if covered for future consideration

## 2022-11-08 LAB — LIPID PANEL
Cholesterol: 234 mg/dL — ABNORMAL HIGH (ref <200)
HDL: 49 mg/dL — ABNORMAL LOW (ref >=50)
LDL Cholesterol (Calc): 165 mg/dL (calc) — ABNORMAL HIGH
Non-HDL Cholesterol (Calc): 185 mg/dL (calc) — ABNORMAL HIGH (ref <130)
Total CHOL/HDL Ratio: 4.8 (calc) (ref <5.0)
Triglycerides: 96 mg/dL (ref <150)

## 2022-11-08 LAB — CBC WITH DIFFERENTIAL/PLATELET
Absolute Monocytes: 422 cells/uL (ref 200–950)
Basophils Absolute: 30 cells/uL (ref 0–200)
Basophils Relative: 0.4 %
Eosinophils Absolute: 81 cells/uL (ref 15–500)
Eosinophils Relative: 1.1 %
HCT: 40.5 % (ref 35.0–45.0)
Hemoglobin: 13.5 g/dL (ref 11.7–15.5)
Lymphs Abs: 2383 cells/uL (ref 850–3900)
MCH: 28.8 pg (ref 27.0–33.0)
MCHC: 33.3 g/dL (ref 32.0–36.0)
MCV: 86.5 fL (ref 80.0–100.0)
MPV: 9.8 fL (ref 7.5–12.5)
Monocytes Relative: 5.7 %
Neutro Abs: 4484 cells/uL (ref 1500–7800)
Neutrophils Relative %: 60.6 %
Platelets: 388 10*3/uL (ref 140–400)
RBC: 4.68 10*6/uL (ref 3.80–5.10)
RDW: 13.7 % (ref 11.0–15.0)
Total Lymphocyte: 32.2 %
WBC: 7.4 10*3/uL (ref 3.8–10.8)

## 2022-11-08 LAB — COMPLETE METABOLIC PANEL WITH GFR
AG Ratio: 1.4 (calc) (ref 1.0–2.5)
ALT: 28 U/L (ref 6–29)
AST: 25 U/L (ref 10–35)
Albumin: 4.3 g/dL (ref 3.6–5.1)
Alkaline phosphatase (APISO): 109 U/L (ref 37–153)
BUN: 13 mg/dL (ref 7–25)
CO2: 28 mmol/L (ref 20–32)
Calcium: 9.9 mg/dL (ref 8.6–10.4)
Chloride: 102 mmol/L (ref 98–110)
Creat: 0.58 mg/dL (ref 0.50–1.03)
Globulin: 3.1 g/dL (calc) (ref 1.9–3.7)
Glucose, Bld: 131 mg/dL — ABNORMAL HIGH (ref 65–99)
Potassium: 4.2 mmol/L (ref 3.5–5.3)
Sodium: 140 mmol/L (ref 135–146)
Total Bilirubin: 0.4 mg/dL (ref 0.2–1.2)
Total Protein: 7.4 g/dL (ref 6.1–8.1)
eGFR: 104 mL/min/{1.73_m2} (ref >=60)

## 2022-11-08 LAB — VITAMIN B12: Vitamin B-12: 684 pg/mL (ref 200–1100)

## 2022-11-08 LAB — HEMOGLOBIN A1C
Hgb A1c MFr Bld: 6.6 % of total Hgb — ABNORMAL HIGH (ref <5.7)
Mean Plasma Glucose: 143 mg/dL
eAG (mmol/L): 7.9 mmol/L

## 2022-11-08 LAB — VITAMIN D 25 HYDROXY (VIT D DEFICIENCY, FRACTURES): Vit D, 25-Hydroxy: 31 ng/mL (ref 30–100)

## 2022-11-08 LAB — TSH: TSH: 1.45 mIU/L (ref 0.40–4.50)

## 2022-11-09 ENCOUNTER — Ambulatory Visit (INDEPENDENT_AMBULATORY_CARE_PROVIDER_SITE_OTHER): Payer: 59 | Admitting: Psychology

## 2022-11-09 DIAGNOSIS — F331 Major depressive disorder, recurrent, moderate: Secondary | ICD-10-CM

## 2022-11-09 NOTE — Progress Notes (Signed)
[place] Counselor/Therapist Progress Note  Patient ID: Kara Porter, MRN: [id number],    Date: 11/09/2022  Time Spent: 60 mins  Treatment Type: Individual Therapy  Reported Symptoms: Pt presents for the session via WebEx video; pt grants consent for the session, stating she is in her home with no one else present.  I shared with pt that I am in my office with no one else here either.  Mental Status Exam: Appearance:  Casual     Behavior: Appropriate  Motor: Normal  Speech/Language:  Clear and Coherent  Affect: Appropriate  Mood: depressed  Thought process: normal  Thought content:   WNL  Sensory/Perceptual disturbances:   WNL  Orientation: oriented to person, place, and time/date  Attention: Good  Concentration: Good  Memory: WNL  Fund of knowledge:  Good  Insight:   Good  Judgment:  Good  Impulse Control: Good   Risk Assessment: Danger to Self:  No Self-injurious Behavior: No Danger to Others: No Duty to Warn:no Physical Aggression / Violence:No  Access to Firearms a concern: No  Gang Involvement:No   Subjective: Pt shares that "I have been OK since our last session.  The funeral was hard; it drug up a lot of emotions for me.  It was had to hear everybody talking about how great he was and that I never saw that part of my father.  I felt angry and I did not want to be and I am still hanging on to some of that.  I do not feel ready to go back to work but I have to.  I do not feel like I have any patience for anything right now.  I am even anxious because I don't feel like I am in control."  Encouraged pt to be as open with people at work next week so they will understand what she has been dealing with during her break from work.  Pt was happy to have help with the funeral arrangements from an [place] friend of her father's.  Pt felt good about her choices with regard to the funeral.  Her daughters and their kids came to the funeral, along with pt's husband, and  they were all supportive.  Pt shares she does not want to be angry for a long time with this loss; assured pt that she will not stay stuck forever; no one ever does stay stuck forever.  Pt shares that she is set to return to work on Monday, 1/29 and she is not sure what to expect from her job.  She still has the target of staying at least one month and re-evaluate her choice about her future employment situation.  Pt continues to take some melatonin to help her sleep; she is sleeping better but not well yet.  She understands that she needs to get more movement in her life to help with her sleep.  Encouraged pt to use her new [employer] membership in this regard.  Pt continues to take her medication and feels like she is finally seeing some benefit from it.  Encouraged pt to continue to give herself grace during this time and to acknowledge that she is improving slowly.  Encouraged pt to be as intentional as possible about engaging in her self care activities between now and our follow up session in 2 weeks.  Interventions: Cognitive Behavioral Therapy  Diagnosis:Major depressive disorder, recurrent, moderate (HCC)  Plan: Treatment Plan Strengths/Abilities:  Intelligent, Intuitive, Willing to participate in therapy Treatment Preferences:  Outpatient  Individual Therapy Statement of Needs:  Patient is to use CBT, mindfulness and coping skills to help manage and/or decrease symptoms associated with their diagnosis. Symptoms:  Depressed/Irritable mood, worry, social withdrawal Problems Addressed:  Depressive thoughts, Sadness, Sleep issues, etc. Long Term Goals:  Pt to reduce overall level, frequency, and intensity of the feelings of depression as evidenced by decreased irritability, negative self talk, and helpless feelings from 6 to 7 days/week to 0 to 1 days/week, per client report, for at least 3 consecutive months.  Progress: 10% Short Term Goals:  Pt to verbally express understanding of the relationship between  feelings of depression and their impact on thinking patterns and behaviors.  Pt to verbalize an understanding of the role that distorted thinking plays in creating fears, excessive worry, and ruminations.  Progress: 10% Target Date:  09/01/2023 Frequency:  Bi-weekly Modality:  Cognitive Behavioral Therapy Interventions by Therapist:  Therapist will use CBT, Mindfulness exercises, Coping skills and Referrals, as needed by client. Client has verbally approved this treatment plan.  Ivan Anchors, [facility]

## 2022-11-24 ENCOUNTER — Ambulatory Visit (INDEPENDENT_AMBULATORY_CARE_PROVIDER_SITE_OTHER): Payer: 59 | Admitting: Psychology

## 2022-11-24 DIAGNOSIS — F331 Major depressive disorder, recurrent, moderate: Secondary | ICD-10-CM | POA: Diagnosis not present

## 2022-11-24 NOTE — Progress Notes (Addendum)
Kara Porter Counselor/Therapist Progress Note  Patient ID: Kara Porter, MRN: [id number],    Date: 11/24/2022  Time Spent: 45 mins  Treatment Type: Individual Therapy  Reported Symptoms: Pt presents for the session via WebEx video; pt grants consent for the session, stating she is in her home with no one else present.  I shared with pt that I am in my office with no one else here either.  Mental Status Exam: Appearance:  Casual     Behavior: Appropriate  Motor: Normal  Speech/Language:  Clear and Coherent  Affect: Appropriate  Mood: depressed  Thought process: normal  Thought content:   WNL  Sensory/Perceptual disturbances:   WNL  Orientation: oriented to person, place, and time/date  Attention: Good  Concentration: Good  Memory: WNL  Fund of knowledge:  Good  Insight:   Good  Judgment:  Good  Impulse Control: Good   Risk Assessment: Danger to Self:  No Self-injurious Behavior: No Danger to Others: No Duty to Warn:no Physical Aggression / Violence:No  Access to Firearms a concern: No  Gang Involvement:No   Subjective: Pt shares that "I am making it.  I went back to work 2 wks ago; this week was not that great for me.  I have been very sad and I have been able to know that it is the best decision for me not to stay.  I realized that this environment is not very forgiving; it is argumentative.  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  This past Tuesday was the one month anniversary of her dad's death.  She is planning to give her notice next Friday.  Pt is experiencing sadness and we talk through how to process her way through these feelings.  Pt shares her husband and her daughter are being very supportive of her during this time.  Pt  continues to take some melatonin to help her sleep; she is sleeping better but not great yet.  She understands that she needs to get more movement in her life to help with her sleep.  Encouraged pt to use her new [employer] membership in this regard.  Pt continues to take her medication and feels like she is finally seeing some benefit from it.  Encouraged pt to continue to give herself grace during this time and to acknowledge that she is improving slowly.  Encouraged pt to get a massage to help her with her stiffness and discomfort.  Encouraged pt to be as intentional as possible about engaging in her self care activities between now and our follow up session in 2 weeks.  Interventions: Cognitive Behavioral Therapy  Diagnosis:Major depressive disorder, recurrent, moderate (HCC)  Plan: Treatment Plan Strengths/Abilities:  Intelligent, Intuitive, Willing to participate in therapy Treatment Preferences:  Outpatient Individual Therapy Statement of Needs:  Patient is to use CBT, mindfulness and coping skills to help manage and/or decrease symptoms associated with their diagnosis. Symptoms:  Depressed/Irritable mood, worry, social withdrawal Problems Addressed:  Depressive thoughts, Sadness, Sleep issues, etc. Long Term Goals:  Pt to reduce overall level, frequency, and intensity of the feelings of depression as evidenced by decreased irritability, negative self talk, and helpless feelings from 6 to 7 days/week to 0 to 1 days/week, per client report, for at  least 3 consecutive months.  Progress: 10% Short Term Goals:  Pt to verbally express understanding of the relationship between feelings of depression and their impact on thinking patterns and behaviors.  Pt to verbalize an understanding of the role that distorted thinking plays in creating fears, excessive worry, and ruminations.  Progress: 10% Target Date:  09/01/2023 Frequency:  Bi-weekly Modality:  Cognitive Behavioral Therapy Interventions by Therapist:   Therapist will use CBT, Mindfulness exercises, Coping skills and Referrals, as needed by client. Client has verbally approved this treatment plan.  Ivan Anchors, [facility]

## 2022-12-08 ENCOUNTER — Ambulatory Visit (INDEPENDENT_AMBULATORY_CARE_PROVIDER_SITE_OTHER): Payer: 59 | Admitting: Psychology

## 2022-12-08 DIAGNOSIS — F331 Major depressive disorder, recurrent, moderate: Secondary | ICD-10-CM

## 2022-12-08 NOTE — Progress Notes (Signed)
[facility] Counselor/Therapist Progress Note  Patient ID: Kara Porter, MRN: [id number],    Date: 12/08/2022  Time Spent: 45 mins  Treatment Type: Individual Therapy  Reported Symptoms: Pt presents for the session via WebEx video; pt grants consent for the session, stating she is in her office with no one else present.  I shared with pt that I am in my office with no one else here either.  Mental Status Exam: Appearance:  Casual     Behavior: Appropriate  Motor: Normal  Speech/Language:  Clear and Coherent  Affect: Appropriate  Mood: depressed  Thought process: normal  Thought content:   WNL  Sensory/Perceptual disturbances:   WNL  Orientation: oriented to person, place, and time/date  Attention: Good  Concentration: Good  Memory: WNL  Fund of knowledge:  Good  Insight:   Good  Judgment:  Good  Impulse Control: Good   Risk Assessment: Danger to Self:  No Self-injurious Behavior: No Danger to Others: No Duty to Warn:no Physical Aggression / Violence:No  Access to Firearms a concern: No  Gang Involvement:No   Subjective: Pt shares that "I am not having fun at all.  It has been a hard couple of weeks since our last session.  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  I knew it was not going to be great, but I did not think it would be like this.  I did turn in my 2 wks notice of resignation on Monday.  I am also trying to start working on my father's estate and that is being tough as well.  There is a lot going on for me right now."  Pt is looking forward to being able to focus on her dad's estate.  Encouraged pt to look at these "little digs" as she is leaving her job as validation points for decision to leave.  Pt shares she is sleeping better but is still only sleeping about 6 hrs per night; encouraged pt to continue to focus on getting a good night's sleep every night.  She is also looking forward to her  time off from work and she does not want to over-do during her time off; she is focusing on self care during this time as well.  She has gotten her fishing license and is looking forward to be outside more as well.  Pt shares that her husband continues to be supportive of pt in her decision to stop working at this time.  They have been doing some more activities together recently and she enjoys the support and concern.  [facility]  Encouraged pt to be as intentional as possible about engaging in her self care activities between now and our follow up session in 2 weeks.  Interventions: Cognitive Behavioral Therapy  Diagnosis:Major depressive disorder, recurrent, moderate (HCC)  Plan: Treatment Plan Strengths/Abilities:  Intelligent, Intuitive, Willing to participate in therapy Treatment Preferences:  Outpatient Individual Therapy Statement of Needs:  Patient is to use CBT, mindfulness and coping skills to help manage and/or decrease symptoms associated with their diagnosis. Symptoms:  Depressed/Irritable mood, worry, social withdrawal Problems Addressed:  Depressive thoughts, Sadness, Sleep issues, etc. Long Term Goals:  Pt to reduce overall level, frequency, and intensity of the feelings of depression as evidenced by decreased irritability, negative self talk, and helpless feelings from 6 to 7  days/week to 0 to 1 days/week, per client report, for at least 3 consecutive months.  Progress: 10% Short Term Goals:  Pt to verbally express understanding of the relationship between feelings of depression and their impact on thinking patterns and behaviors.  Pt to verbalize an understanding of the role that distorted thinking plays in creating fears, excessive worry, and ruminations.  Progress: 10% Target Date:  09/01/2023 Frequency:  Bi-weekly Modality:  Cognitive Behavioral Therapy Interventions by Therapist:  Therapist will use  CBT, Mindfulness exercises, Coping skills and Referrals, as needed by client. Client has verbally approved this treatment plan.  Ivan Anchors, [facility]

## 2022-12-22 LAB — COLOGUARD: COLOGUARD: NEGATIVE

## 2022-12-29 ENCOUNTER — Ambulatory Visit (INDEPENDENT_AMBULATORY_CARE_PROVIDER_SITE_OTHER): Payer: 59 | Admitting: Psychology

## 2022-12-29 DIAGNOSIS — F331 Major depressive disorder, recurrent, moderate: Secondary | ICD-10-CM | POA: Diagnosis not present

## 2022-12-29 NOTE — Progress Notes (Signed)
[facility] Counselor/Therapist Progress Note  Patient ID: Kara Porter, MRN: [id number],    Date: 12/29/2022  Time Spent: 45 mins  Treatment Type: Individual Therapy  Reported Symptoms: Pt presents for the session via WebEx video; pt grants consent for the session, stating she is in her office with no one else present.  I shared with pt that I am in my office with no one else here either.  Mental Status Exam: Appearance:  Casual     Behavior: Appropriate  Motor: Normal  Speech/Language:  Clear and Coherent  Affect: Appropriate  Mood: depressed  Thought process: normal  Thought content:   WNL  Sensory/Perceptual disturbances:   WNL  Orientation: oriented to person, place, and time/date  Attention: Good  Concentration: Good  Memory: WNL  Fund of knowledge:  Good  Insight:   Good  Judgment:  Good  Impulse Control: Good   Risk Assessment: Danger to Self:  No Self-injurious Behavior: No Danger to Others: No Duty to Warn:no Physical Aggression / Violence:No  Access to Firearms a concern: No  Gang Involvement:No   Subjective: Pt shares that "I have been home from work for the past two weeks.  It was hard to work that notice period but I wanted to be professional.  [facility].  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  She has also been talking to her husband about when she should retire; they agreed on 60 yo for her.  She knows she needs to start looking for a job at some point but has not started doing so yet.  She is still taking her Lexapro and is sleeping OK; some nights she tosses and turns but most nights are good.  She is still feeling anxious about not being employed but wants to take a month off before she starts looking for a new job.  She is planning to go to [place] with her  daughter and her kids (42 yo and 84 yo) for the kids Spring Break.  Pt is considering hiring an attorney to helping her with the settling of the estate; has to talk with her sister about  this option.  Pt is also listening to a motivational speaker who has a 30 day program that is designed to create next steps for the participants to set their future course; she is behind the group's progress at this point but she believes it will be beneficial for her to seek her next steps.  This helps pt feel less isolated; "feeling like I am not the only one in this situation I am in."  Pt shares that she and her husband are considering going to [place] to visit his family for a long weekend.  She has gotten her fishing license and is looking forward to be outside more as well.  Pt shares that her husband continues to be supportive of pt in her decision to stop working at this time.  They have been doing some more activities together recently and she enjoys the support and concern.  Encouraged pt to be as intentional as possible about engaging in her self care activities between now and our follow up session in 4 weeks.  Interventions: Cognitive Behavioral Therapy  Diagnosis:Major depressive disorder, recurrent, moderate ([name])  Plan: Treatment  Plan Strengths/Abilities:  Intelligent, Intuitive, Willing to participate in therapy Treatment Preferences:  Outpatient Individual Therapy Statement of Needs:  Patient is to use CBT, mindfulness and coping skills to help manage and/or decrease symptoms associated with their diagnosis. Symptoms:  Depressed/Irritable mood, worry, social withdrawal Problems Addressed:  Depressive thoughts, Sadness, Sleep issues, etc. Long Term Goals:  Pt to reduce overall level, frequency, and intensity of the feelings of depression as evidenced by decreased irritability, negative self talk, and helpless feelings from 6 to 7 days/week to 0 to 1 days/week, per client report, for at least 3 consecutive  months.  Progress: 10% Short Term Goals:  Pt to verbally express understanding of the relationship between feelings of depression and their impact on thinking patterns and behaviors.  Pt to verbalize an understanding of the role that distorted thinking plays in creating fears, excessive worry, and ruminations.  Progress: 10% Target Date:  09/01/2023 Frequency:  Bi-weekly Modality:  Cognitive Behavioral Therapy Interventions by Therapist:  Therapist will use CBT, Mindfulness exercises, Coping skills and Referrals, as needed by client. Client has verbally approved this treatment plan.  Ivan Anchors, [facility]

## 2023-01-24 ENCOUNTER — Ambulatory Visit (INDEPENDENT_AMBULATORY_CARE_PROVIDER_SITE_OTHER): Payer: Managed Care, Other (non HMO) | Admitting: Psychology

## 2023-01-24 DIAGNOSIS — F331 Major depressive disorder, recurrent, moderate: Secondary | ICD-10-CM

## 2023-01-24 NOTE — Progress Notes (Signed)
[facility] Counselor/Therapist Progress Note  Patient ID: Kara Porter, MRN: [id number],    Date: 01/24/2023  Time Spent: 45 mins  Treatment Type: Individual Therapy  Reported Symptoms: Pt presents for the session via WebEx video; pt grants consent for the session, stating she is in her office with no one else present.  I shared with pt that I am in my office with no one else here either.  Mental Status Exam: Appearance:  Casual     Behavior: Appropriate  Motor: Normal  Speech/Language:  Clear and Coherent  Affect: Appropriate  Mood: depressed  Thought process: normal  Thought content:   WNL  Sensory/Perceptual disturbances:   WNL  Orientation: oriented to person, place, and time/date  Attention: Good  Concentration: Good  Memory: WNL  Fund of knowledge:  Good  Insight:   Good  Judgment:  Good  Impulse Control: Good   Risk Assessment: Danger to Self:  No Self-injurious Behavior: No Danger to Others: No Duty to Warn:no Physical Aggression / Violence:No  Access to Firearms a concern: No  Gang Involvement:No   Subjective: Pt shares that "I have been trying to keep busy since our last session.  I am working through my "to do list" and am making progress.  I feel good about everything I am accomplishing.  I talked with my sister yesterday and told her the last 6 wks away from work have been good; I like what I am doing now."  Pt shares that she feels good about how much she is accomplishing and knows that she is making progress.  She has hired an Pensions consultant to do the probate for her dad's estate and her sister was OK with the idea because she did not want to do it either.  She went to [place] with her daughters and their kids last week and they had a nice time together with the grand kids.  Pt shares that she is having some difficulty processing her feelings surrounding her father's passing; her father and step mother were both alcoholics and pt never felt like she  did not measure up; she never wanted them to be upset with her so she became a 'people pleaser'.  Pt shares she has been taking time to organize her home, she has been cooking more, she has been walking and moving more; she has been outside more frequently this Spring-they just got their deck re-done and she loves that.  She has also continued to attend her grandkids' soccer games.  She has been working on puzzles and word-searches.  Pt is planning to go on her first cruise (5 days out of [place] to [place]) and pt is looking forward to that trip; she is going to celebrate her 62th birthday on the trip.  She is going with her sister and their husbands in late August for her birthday.  She is still taking her Lexapro and is sleeping OK.  Pt shares that her husband continues to be supportive of pt in her decision to stop working at this time.  They have been doing some more activities together recently and she enjoys the support and concern.  Encouraged pt to be as intentional as possible about engaging in her self care activities between now and our follow up session in 4 weeks.  Interventions: Cognitive Behavioral Therapy  Diagnosis:Major depressive disorder, recurrent, moderate  Plan: Treatment Plan Strengths/Abilities:  Intelligent, Intuitive, Willing to participate in therapy Treatment Preferences:  Outpatient Individual Therapy Statement of Needs:  Patient  is to use CBT, mindfulness and coping skills to help manage and/or decrease symptoms associated with their diagnosis. Symptoms:  Depressed/Irritable mood, worry, social withdrawal Problems Addressed:  Depressive thoughts, Sadness, Sleep issues, etc. Long Term Goals:  Pt to reduce overall level, frequency, and intensity of the feelings of depression as evidenced by decreased irritability, negative self talk, and helpless feelings from 6 to 7 days/week to 0 to 1 days/week, per client report, for at least 3 consecutive months.  Progress: 10% Short  Term Goals:  Pt to verbally express understanding of the relationship between feelings of depression and their impact on thinking patterns and behaviors.  Pt to verbalize an understanding of the role that distorted thinking plays in creating fears, excessive worry, and ruminations.  Progress: 10% Target Date:  09/01/2023 Frequency:  Bi-weekly Modality:  Cognitive Behavioral Therapy Interventions by Therapist:  Therapist will use CBT, Mindfulness exercises, Coping skills and Referrals, as needed by client. Client has verbally approved this treatment plan.  Karie Kirks, [facility]

## 2023-01-29 ENCOUNTER — Other Ambulatory Visit: Payer: Self-pay | Admitting: Family Medicine

## 2023-01-29 DIAGNOSIS — B009 Herpesviral infection, unspecified: Secondary | ICD-10-CM

## 2023-01-30 NOTE — Telephone Encounter (Signed)
Requested Prescriptions  Pending Prescriptions Disp Refills   valACYclovir (VALTREX) 500 MG tablet [Pharmacy Med Name: VALACYCLOVIR HCL 500 MG TABLET] 270 tablet 0    Sig: TAKE 1 TABLET (500 MG TOTAL) BY MOUTH 3 (THREE) TIMES DAILY AS NEEDED FOR FLARE     Antimicrobials:  Antiviral Agents - Anti-Herpetic Passed - 01/29/2023  9:30 AM      Passed - Valid encounter within last 12 months    Recent Outpatient Visits           2 months ago Annual physical exam   [facility], Netta Neat, DO   4 months ago Generalized anxiety disorder with panic attacks   [facility] Smitty Cords, DO   5 months ago COVID-19 virus infection   [facility] [place], Netta Neat, DO   5 months ago Generalized anxiety disorder with panic attacks   [facility] Smitty Cords, DO   1 year ago Acute non-recurrent frontal sinusitis   [facility] [place], Netta Neat, [place]

## 2023-02-21 ENCOUNTER — Ambulatory Visit (INDEPENDENT_AMBULATORY_CARE_PROVIDER_SITE_OTHER): Payer: 59 | Admitting: Psychology

## 2023-02-21 DIAGNOSIS — F331 Major depressive disorder, recurrent, moderate: Secondary | ICD-10-CM

## 2023-02-21 NOTE — Progress Notes (Signed)
Behavioral Health Counselor/Therapist Progress Note  Patient ID: Kara Porter, MRN: [id number],    Date: 02/21/2023  Time Spent: 45 mins  Treatment Type: Individual Therapy  Reported Symptoms: Pt presents for the session via Careg[facility] video; pt grants consent for the session, stating she is in her office with no one else present.  I shared with pt that I am in my office with no one else here either.  Mental Status Exam: Appearance:  Casual     Behavior: Appropriate  Motor: Normal  Speech/Language:  Clear and Coherent  Affect: Appropriate  Mood: depressed  Thought process: normal  Thought content:   WNL  Sensory/Perceptual disturbances:   WNL  Orientation: oriented to person, place, and time/date  Attention: Good  Concentration: Good  Memory: WNL  Fund of knowledge:  Good  Insight:   Good  Judgment:  Good  Impulse Control: Good   Risk Assessment: Danger to Self:  No Self-injurious Behavior: No Danger to Others: No Duty to Warn:no Physical Aggression / Violence:No  Access to Firearms a concern: No  Gang Involvement:No   Subjective: Pt shares that "I have been doing well since our last session about 4 weeks ago.  I have applied for one job and that is OK.  My blood sugars are a little out of control and I am trying to work on it with my diet and exercise."  Pt shares some of her feelings related to her father.  Her mom and dad separated when she was 72 yo and he remarried quickly and she was made to leave his home and go live with her mom.  It turns out that her father married his first wife and had a child; he left that woman and moved to [place].  He met another woman and had a child with her and then met pt's mom and had pt.  Pt is now aware that he was always leaving women and children; she had always thought that it was her fault that she couldn't spend time with him and that she did not have a relationship with him.  She also shares that, before he got COVID and got so  sick, he had a moment of clarity when he told pt that he loved her.  That was very meaningful for pt and she knows not that it was, in fact, not her fault.  Pt shares that she is going to visit her mom next week and plans to connect with her mom on these childhood issues and gather additional information.  Pt shares that pt is bi-racial (father was black and her mom is [place]).  She has a sister who is 6 yrs younger and from the same parents; pt has not told her sister what she has learned about her parents; both of her parents are members of the AA community and had a history of drinking behaviors.  Pt has started yoga again and is enjoying that; she is managing her time and activities intentionally; she has been cooking more as well; she has gotten a Arts development officer; she has been working on puzzles and word searches, etc.  Pt is planning to go on her first cruise (5 days out of [place] to [place]) and pt is looking forward to that trip; she is going to celebrate her 62th birthday on the trip.  She is going with her sister and their husbands in late August for her birthday.  Pt weaned herself off of her Lexapro and is taking a natural  supplement for mood and is feeling good with it.  They have been doing some more activities together recently and she enjoys the support and concern.  Encouraged pt to be as intentional as possible about engaging in her self care activities between now and our follow up session in 4 weeks.  Interventions: Cognitive Behavioral Therapy  Diagnosis:Major depressive disorder, recurrent, moderate (HCC)  Plan: Treatment Plan Strengths/Abilities:  Intelligent, Intuitive, Willing to participate in therapy Treatment Preferences:  Outpatient Individual Therapy Statement of Needs:  Patient is to use CBT, mindfulness and coping skills to help manage and/or decrease symptoms associated with their diagnosis. Symptoms:  Depressed/Irritable mood, worry, social withdrawal Problems Addressed:   Depressive thoughts, Sadness, Sleep issues, etc. Long Term Goals:  Pt to reduce overall level, frequency, and intensity of the feelings of depression as evidenced by decreased irritability, negative self talk, and helpless feelings from 6 to 7 days/week to 0 to 1 days/week, per client report, for at least 3 consecutive months.  Progress: 10% Short Term Goals:  Pt to verbally express understanding of the relationship between feelings of depression and their impact on thinking patterns and behaviors.  Pt to verbalize an understanding of the role that distorted thinking plays in creating fears, excessive worry, and ruminations.  Progress: 10% Target Date:  09/01/2023 Frequency:  Bi-weekly Modality:  Cognitive Behavioral Therapy Interventions by Therapist:  Therapist will use CBT, Mindfulness exercises, Coping skills and Referrals, as needed by client. Client has verbally approved this treatment plan.  Karie Kirks, [facility]

## 2023-03-21 ENCOUNTER — Ambulatory Visit (INDEPENDENT_AMBULATORY_CARE_PROVIDER_SITE_OTHER): Payer: 59 | Admitting: Psychology

## 2023-03-21 DIAGNOSIS — F331 Major depressive disorder, recurrent, moderate: Secondary | ICD-10-CM

## 2023-03-21 NOTE — Progress Notes (Signed)
[facility] Counselor/Therapist Progress Note  Patient ID: Kara Porter, MRN: [id number],    Date: 03/21/2023  Time Spent: 45 mins  Treatment Type: Individual Therapy  Reported Symptoms: Pt presents for the session via Caregility video; pt grants consent for the session, stating she is in her office with no one else present.  I shared with pt that I am in my office with no one else here either.  Mental Status Exam: Appearance:  Casual     Behavior: Appropriate  Motor: Normal  Speech/Language:  Clear and Coherent  Affect: Appropriate  Mood: depressed  Thought process: normal  Thought content:   WNL  Sensory/Perceptual disturbances:   WNL  Orientation: oriented to person, place, and time/date  Attention: Good  Concentration: Good  Memory: WNL  Fund of knowledge:  Good  Insight:   Good  Judgment:  Good  Impulse Control: Good   Risk Assessment: Danger to Self:  No Self-injurious Behavior: No Danger to Others: No Duty to Warn:no Physical Aggression / Violence:No  Access to Firearms a concern: No  Gang Involvement:No   Subjective: Pt shares that "I have been doing OK since our last session about 4 weeks ago.  I am having some good days and some not great days.  I have not been feeling well and I am afraid it is my sugar and I do not want that to be the case.  My anxiety is a little higher as well and it seems that my frustration and anger is a little higher."  Encouraged pt to see her PCP soon related to this possible diagnosis so she can get lab work done and evaluate her current health condition.  Pt shares she did go to [facility] to see her mom since our last session; "she has always been a trigger for me so sometimes these visits are hard for me; this one was pretty good."  Pt learned that her mom and step father are having some financial difficulties and she paid two months of her mom's rent to be helpful.  She also learned after she got home that her sister had also  given their mom some money as well.  She later learned that her mom and step dad were making some decisions with the extra money that pt would not have made "but I stayed in my lane and let them do what they were going to do and just focused on the fact that I helped my mom."  Pt shares that she did talk with her mom about the fact that pt learned from her mom that her mom was not married when pt was born and that her father was the kind of the man he was and that was why she did not feel connected to her father.  Pt felt good about how this conversation was for her.  Pt has been going to walk more frequently since our last session.  She has also been back to the chiropractor related to her back pain and she had acupuncture that helped her back pain.  Pt also made an appt to donate blood this Friday and she also plans to get her COVID booster shot before she goes on her cruise in August for her birthday with her sister and their husbands.  Pt shares that she is making intentional choices around food choices and cooking in an effort to eat more healthy meals.  Pt shares that she is trying to maximize her time while she is out of  work right now.  Pt weaned herself off of her Lexapro and is taking a natural supplement for mood and is feeling good with it.  She "does not like to take a lot of medications."  Pt shares that her husband is continuing to work and is doing well.  Pt shares that there is one person from her former work who is routinely reaching out to her and pt appreciates that contact; pt shares, "I realize that it was not about me so that is helpful for me." Encouraged pt to be as intentional as possible about engaging in her self care activities between now and our follow up session in 4 weeks.  Interventions: Cognitive Behavioral Therapy  Diagnosis:Major depressive disorder, recurrent, moderate (HCC)  Plan: Treatment Plan Strengths/Abilities:  Intelligent, Intuitive, Willing to participate in  therapy Treatment Preferences:  Outpatient Individual Therapy Statement of Needs:  Patient is to use CBT, mindfulness and coping skills to help manage and/or decrease symptoms associated with their diagnosis. Symptoms:  Depressed/Irritable mood, worry, social withdrawal Problems Addressed:  Depressive thoughts, Sadness, Sleep issues, etc. Long Term Goals:  Pt to reduce overall level, frequency, and intensity of the feelings of depression as evidenced by decreased irritability, negative self talk, and helpless feelings from 6 to 7 days/week to 0 to 1 days/week, per client report, for at least 3 consecutive months.  Progress: 10% Short Term Goals:  Pt to verbally express understanding of the relationship between feelings of depression and their impact on thinking patterns and behaviors.  Pt to verbalize an understanding of the role that distorted thinking plays in creating fears, excessive worry, and ruminations.  Progress: 10% Target Date:  09/01/2023 Frequency:  Bi-weekly Modality:  Cognitive Behavioral Therapy Interventions by Therapist:  Therapist will use CBT, Mindfulness exercises, Coping skills and Referrals, as needed by client. Client has verbally approved this treatment plan.  Karie Kirks, [facility]

## 2023-03-26 ENCOUNTER — Ambulatory Visit: Payer: Managed Care, Other (non HMO) | Admitting: Family Medicine

## 2023-03-26 ENCOUNTER — Encounter: Payer: Self-pay | Admitting: Family Medicine

## 2023-03-26 VITALS — BP 122/68 | HR 89 | Temp 98.7°F | Resp 17 | Ht 64.0 in | Wt 256.4 lb

## 2023-03-26 DIAGNOSIS — R140 Abdominal distension (gaseous): Secondary | ICD-10-CM

## 2023-03-26 DIAGNOSIS — G4719 Other hypersomnia: Secondary | ICD-10-CM

## 2023-03-26 DIAGNOSIS — F331 Major depressive disorder, recurrent, moderate: Secondary | ICD-10-CM

## 2023-03-26 DIAGNOSIS — F5104 Psychophysiologic insomnia: Secondary | ICD-10-CM

## 2023-03-26 DIAGNOSIS — F411 Generalized anxiety disorder: Secondary | ICD-10-CM

## 2023-03-26 DIAGNOSIS — E1169 Type 2 diabetes mellitus with other specified complication: Secondary | ICD-10-CM

## 2023-03-26 DIAGNOSIS — L98491 Non-pressure chronic ulcer of skin of other sites limited to breakdown of skin: Secondary | ICD-10-CM

## 2023-03-26 DIAGNOSIS — R29818 Other symptoms and signs involving the nervous system: Secondary | ICD-10-CM

## 2023-03-26 DIAGNOSIS — F410 Panic disorder [episodic paroxysmal anxiety] without agoraphobia: Secondary | ICD-10-CM

## 2023-03-26 DIAGNOSIS — K581 Irritable bowel syndrome with constipation: Secondary | ICD-10-CM

## 2023-03-26 LAB — POCT GLYCOSYLATED HEMOGLOBIN (HGB A1C): Hemoglobin A1C: 7 % — AB (ref 4.0–5.6)

## 2023-03-26 MED ORDER — MOUNJARO 2.5 MG/0.5ML ~~LOC~~ SOAJ: 2.5000 mg | SUBCUTANEOUS | 0 refills | Status: DC

## 2023-03-26 MED ORDER — MUPIROCIN 2 % EX OINT: 1.0000 | TOPICAL_OINTMENT | Freq: Two times a day (BID) | CUTANEOUS | 0 refills | Status: DC

## 2023-03-26 NOTE — Assessment & Plan Note (Signed)
A1c up to 7.0, prior 6.6, new diagnosis Type 2 Diabetes Complications - obesity, sleep apnea  Plan:  1. Add Mounjaro 2.5mg  weekly inj - GLP/GIP therapy, dose titrate every 30 days as tolerated. Caution with her GI concerns but will trial this, she has tried Ozempic before. - May keep Metformin but seems ineffective, or discontinue  2. Encourage improved lifestyle - low carb, low sugar diet, reduce portion size, continue improving regular exercise 3. Check CBG , bring log to next visit for review - Future consider statin  Next time DM Foot exam, Advised to schedule DM ophtho exam, send record, Urine Microalbumin

## 2023-03-26 NOTE — Patient Instructions (Addendum)
Thank you for coming to the office today.  Start Mounjaro 2.5mg  weekly x 4 pens = 1 month supply, wait for prior authorization.  Pause the Metformin  -------------------  Referral to [facility]  [place]  [address] [place], [place] [address]  Phone: [phone]  Also have [place] branch.  Stay tuned for referral.   Recent Labs    11/07/22 0853 03/26/23 0959  HGBA1C 6.6* 7.0*   [facility] Address: [address] [place], [place], [place] [address] Phone: [phone]  Referral sent via fax today. Stay tuned to hear back from them, they should call you to schedule initial sleep study, likely a home test and then if abnormal - they will arrange the 2nd part of the sleep study in the lab with the CPAP machine.  If there is any issue with this company, for example not covered by insurance or other problem, please notify us and they may do so a well - we will need to change the location of the referral.  --------------------------------------------   Recommend Diabetic Eye Exam report faxed to [place] [phone]   Please schedule a Follow-up Appointment to: Return in about 3 months (around 06/26/2023) for 3 month DM A1c, Urine Microalbumin.  If you have any other questions or concerns, please feel free to call the office or send a message through MyChart. You may also schedule an earlier appointment if necessary.  Additionally, you may be receiving a survey about your experience at our office within a few days to 1 week by e-mail or mail. We value your feedback.  Saralyn Pilar, DO [facility], [place]

## 2023-03-26 NOTE — Progress Notes (Signed)
Subjective:    Patient ID: Kara Porter, female    DOB: 01/13/1961, 62 y.o.   MRN: [id number]  Kara Porter is a 62 y.o. female presenting on 03/26/2023 for Fatigue (Fatigue x . The pt complains of elevated fasting blood sugar 130-150. Concern for spikes at night, but normal blood sugars throughout the day. The pt complains that she is tired when she wake up, but she also snores. )   HPI  Type 2 Diabetes / Morbid Obesity BMI >44 Prior history Pre Diabetic range A1c up to 6.6 now, due today again A1c 5.7 > 6.6 (10/2022) Meds: Metformin 500mg  TWICE A DAY IR - Off Ozempic/Wegovy previously CBG Still very high blood sugar AM 130-159 During day often improved  Anxiety - On occasion taking Buspar 5mg  THREE TIMES A DAY AS NEEDED, especially if wake up overnight - She has Trazodone 50mg  if need but very rarely takes    History with Admits panic attacks while driving on interstate when driving worse scenario for her panic.  History of suspected sleep apnea - last eval 02/2021 but she could not afford the higher deductible cost of sleep study.  Request new order now  Slow Healing - skin wounds one from derm biopsy and one from minor scratch with scabbing  IBS / Constipation/ Functional GI Extreme Bloating / Flatulance - every day  Inhaler using regularly. Has some wheezing when breathing out  Sleep study referral Gastro referral   Epworth Sleepiness Scale Total Score: 15 Sitting and reading - 3 Watching TV - 3 Sitting inactive in a public place - 2 As a passenger in a car for an hour without a break - 2 Lying down to rest in the afternoon when circumstances permit - 3 Sitting and talking to someone - 0 Sitting quietly after a lunch without alcohol - 1 In a car, while stopped for a few minutes in traffic - 1     STOP-Bang OSA scoring Snoring yes    Tiredness yes    Observed apneas no    Pressure HTN no    BMI > 35 kg/m2 yes    Age > 50  yes    Neck (female >17 in;  Female >16 in)  yes 34"  Gender female no    OSA risk low (0-2)  OSA risk intermediate (3-4)  OSA risk high (5+)   Total: 5 high risk          03/26/2023    9:23 AM 11/07/2022    8:06 AM 09/28/2022    9:31 AM  Depression screen PHQ 2/9  Decreased Interest 2 2 3   Down, Depressed, Hopeless 0 1 2  PHQ - 2 Score 2 3 5   Altered sleeping 2 1 3   Tired, decreased energy 2 3 3   Change in appetite 0 2 2  Feeling bad or failure about yourself  0 1 2  Trouble concentrating 0 1 2  Moving slowly or fidgety/restless 0 1 1  Suicidal thoughts 0 0 0  PHQ-9 Score 6 12 18   Difficult doing work/chores Very difficult Very difficult Very difficult    Social History   Tobacco Use   Smoking status: Former    Packs/day: 0.75    Years: 6.00    Additional pack years: 0.00    Total pack years: 4.50    Types: Cigarettes    Quit date: 2018    Years since quitting: 6.4   Smokeless tobacco: Former  Building services engineer Use:  Never used  Substance Use Topics   Alcohol use: Not Currently   Drug use: Never    Review of Systems Per HPI unless specifically indicated above     Objective:    BP 122/68 (BP Location: Right Arm, Patient Position: Sitting, Cuff Size: Normal)   Pulse 89   Temp 98.7 F (37.1 C) (Oral)   Resp 17   Ht 5\' 4"  (1.626 m)   Wt 256 lb 6.4 oz (116.3 kg)   LMP 11/24/2017 (Exact Date)   SpO2 98%   BMI 44.01 kg/m   Wt Readings from Last 3 Encounters:  03/26/23 256 lb 6.4 oz (116.3 kg)  11/07/22 242 lb (109.8 kg)  09/28/22 240 lb (108.9 kg)    Physical Exam Vitals and nursing note reviewed.  Constitutional:      General: She is not in acute distress.    Appearance: She is well-developed. She is obese. She is not diaphoretic.     Comments: Well-appearing, comfortable, cooperative  HENT:     Head: Normocephalic and atraumatic.  Eyes:     General:        Right eye: No discharge.        Left eye: No discharge.     Conjunctiva/sclera: Conjunctivae normal.  Neck:      Thyroid: No thyromegaly.  Cardiovascular:     Rate and Rhythm: Normal rate and regular rhythm.     Heart sounds: Normal heart sounds. No murmur heard. Pulmonary:     Effort: Pulmonary effort is normal. No respiratory distress.     Breath sounds: Normal breath sounds. No wheezing or rales.  Musculoskeletal:        General: Normal range of motion.     Cervical back: Normal range of motion and neck supple.  Lymphadenopathy:     Cervical: No cervical adenopathy.  Skin:    General: Skin is warm and dry.     Findings: No erythema or rash.  Neurological:     Mental Status: She is alert and oriented to person, place, and time.  Psychiatric:        Behavior: Behavior normal.     Comments: Well groomed, good eye contact, normal speech and thoughts      Results for orders placed or performed in visit on 03/26/23  POCT glycosylated hemoglobin (Hb A1C)  Result Value Ref Range   Hemoglobin A1C 7.0 (A) 4.0 - 5.6 %      Assessment & Plan:   Problem List Items Addressed This Visit     Excessive daytime sleepiness   Generalized anxiety disorder with panic attacks   Irritable bowel syndrome   Relevant Orders   Ambulatory referral to Gastroenterology   Major depressive disorder, recurrent, moderate (HCC)   Psychophysiological insomnia   Suspected sleep apnea   Type 2 diabetes mellitus with other specified complication (HCC) - Primary    A1c up to 7.0, prior 6.6, new diagnosis Type 2 Diabetes Complications - obesity, sleep apnea  Plan:  1. Add Mounjaro 2.5mg  weekly inj - GLP/GIP therapy, dose titrate every 30 days as tolerated. Caution with her GI concerns but will trial this, she has tried Ozempic before. - May keep Metformin but seems ineffective, or discontinue  2. Encourage improved lifestyle - low carb, low sugar diet, reduce portion size, continue improving regular exercise 3. Check CBG , bring log to next visit for review - Future consider statin  Next time DM Foot exam,  Advised to schedule DM ophtho exam, send record,  Urine Microalbumin       Relevant Medications   MOUNJARO 2.5 MG/0.5ML Pen   Other Relevant Orders   POCT glycosylated hemoglobin (Hb A1C) (Completed)   Other Visit Diagnoses     Functional bloating       Relevant Orders   Ambulatory referral to Gastroenterology   Ulcer, skin, non-healing, limited to breakdown of skin (HCC)       Relevant Medications   mupirocin ointment (BACTROBAN) 2 %       Major Depression recurrent moderate Anxiety Improved on medication currently - Buspar, Escitalopram, Trazodone Continues with Therapist   Non healing skin sores / biopsy / scab Use topical Mupirocin to help heal TWICE A DAY for 2 weeks  IBS / Functional GI Symptoms referral to Gastroenterology for consultation on chronic IBS-Constipation and mixed symptoms, functional digestive concerns, frequent bloating, gas.    Orders Placed This Encounter  Procedures   Ambulatory referral to Gastroenterology    Referral Priority:   Routine    Referral Type:   Consultation    Referral Reason:   Specialty Services Required    Number of Visits Requested:   1   POCT glycosylated hemoglobin (Hb A1C)   Persistent clinical concern for suspected obstructive sleep apnea given reported symptoms with witnessed apnea, snoring and sleep disturbance, fatigue excessive sleepiness. - Screening: ESS score 15 / STOP-Bang Score 5 high risk - Neck Circumference: 18" - Co-morbidities: Type 2 Diabetes, Mood Disorder  Plan: 1. Discussion on initial diagnosis and testing for OSA, risk factors, management, complications 2. Agree to proceed with sleep study testing based on clinical concerns - will fax referral to [facility] - to arrange initial PSG home vs in sleep lab and further evaluation.   Meds ordered this encounter  Medications   MOUNJARO 2.5 MG/0.5ML Pen    Sig: Inject 2.5 mg into the skin once a week.    Dispense:  2 mL    Refill:  0    mupirocin ointment (BACTROBAN) 2 %    Sig: Apply 1 Application topically 2 (two) times daily. For 2 weeks    Dispense:  22 g    Refill:  0    Follow up plan: Return in about 3 months (around 06/26/2023) for 3 month DM A1c, Urine Microalbumin.   Saralyn Pilar, DO [facility] 03/26/2023, 9:25 AM

## 2023-04-18 ENCOUNTER — Ambulatory Visit: Payer: 59 | Admitting: Psychology

## 2023-04-21 ENCOUNTER — Encounter: Payer: Self-pay | Admitting: Family Medicine

## 2023-04-21 DIAGNOSIS — E1169 Type 2 diabetes mellitus with other specified complication: Secondary | ICD-10-CM

## 2023-04-23 MED ORDER — MOUNJARO 2.5 MG/0.5ML ~~LOC~~ SOAJ
2.5000 mg | SUBCUTANEOUS | 1 refills | Status: DC
Start: 2023-04-23 — End: 2023-04-24

## 2023-04-24 ENCOUNTER — Telehealth: Payer: Self-pay | Admitting: Family Medicine

## 2023-04-24 ENCOUNTER — Other Ambulatory Visit: Payer: Self-pay | Admitting: Family Medicine

## 2023-04-24 DIAGNOSIS — E1169 Type 2 diabetes mellitus with other specified complication: Secondary | ICD-10-CM

## 2023-04-24 MED ORDER — MOUNJARO 2.5 MG/0.5ML ~~LOC~~ SOAJ: 2.5000 mg | SUBCUTANEOUS | 5 refills | Status: DC

## 2023-04-24 NOTE — Telephone Encounter (Signed)
Marquita Palms with [facility] Benefits called asking for a Pa on Quantity Limits on the [facility].  CB#  [phone]

## 2023-04-24 NOTE — Telephone Encounter (Signed)
Okay thanks. I have cancelled Mounjaro 2.5 for 90 day supply, if insurance quantity limit is restricting it, I will re order the 28 day (4 week supply) instead since it is covered without PA  Saralyn Pilar, DO [facility] 04/24/2023, 5:05 PM

## 2023-04-24 NOTE — Telephone Encounter (Signed)
Requested medication (s) are due for refill today: alternative medication requested  Requested medication (s) are on the active medication list: yes  Last refill:  04/23/23  Future visit scheduled: yes  Notes to clinic:  Pharmacy comment: Alternative Requested:C.      Requested Prescriptions  Pending Prescriptions Disp Refills   MOUNJARO 2.5 MG/0.5ML Pen [Pharmacy Med Name: MOUNJARO 2.5 MG/0.5 ML PEN]  1    Sig: INJECT 2.5 MG SUBCUTANEOUSLY WEEKLY     Off-Protocol Failed - 04/24/2023  8:58 AM      Failed - Medication not assigned to a protocol, review manually.      Passed - Valid encounter within last 12 months    Recent Outpatient Visits           4 weeks ago Type 2 diabetes mellitus with other specified complication, without long-term current use of insulin [facility])   [place] [facility] [place], Netta Neat, DO   5 months ago Annual physical exam   [place] [facility] Smitty Cords, DO   6 months ago Generalized anxiety disorder with panic attacks   [place] [facility] Smitty Cords, DO   7 months ago COVID-19 virus infection   [place] [facility] [place], Netta Neat, DO   8 months ago Generalized anxiety disorder with panic attacks   [place] [facility] Althea Charon, Netta Neat, DO       Future Appointments             In 2 months Al[place] Charon, Netta Neat, DO [facility], [facility]

## 2023-04-25 ENCOUNTER — Ambulatory Visit (INDEPENDENT_AMBULATORY_CARE_PROVIDER_SITE_OTHER): Payer: 59 | Admitting: Psychology

## 2023-04-25 DIAGNOSIS — F331 Major depressive disorder, recurrent, moderate: Secondary | ICD-10-CM | POA: Diagnosis not present

## 2023-04-25 NOTE — Progress Notes (Signed)
[facility] Counselor/Therapist Progress Note  Patient ID: Kara Porter, MRN: [id number],    Date: 04/25/2023  Time Spent: 45 mins   start time: 1100   end time 1145  Treatment Type: Individual Therapy  Reported Symptoms: Pt presents for the session via Caregility video; pt grants consent for the session, stating she is in her office with no one else present; pt understands the limits of virtual sessions.  I shared with pt that I am in my office with no one else here either.  Mental Status Exam: Appearance:  Casual     Behavior: Appropriate  Motor: Normal  Speech/Language:  Clear and Coherent  Affect: Appropriate  Mood: depressed  Thought process: normal  Thought content:   WNL  Sensory/Perceptual disturbances:   WNL  Orientation: oriented to person, place, and time/date  Attention: Good  Concentration: Good  Memory: WNL  Fund of knowledge:  Good  Insight:   Good  Judgment:  Good  Impulse Control: Good   Risk Assessment: Danger to Self:  No Self-injurious Behavior: No Danger to Others: No Duty to Warn:no Physical Aggression / Violence:No  Access to Firearms a concern: No  Gang Involvement:No   Subjective: Pt shares that "I have been doing OK since our last session.  I have been to my PCP and they have diagnosed me with borderline diabetes; my A1C is 7 and it needs to be about 6.  I have been on Monjauro for the past 4 wks.  I have lost a little weight and the inflammation in my body is reduced and I am feeling better.  I also went on Weight Watchers.  My awareness of what I am eating is way up."  Pt feels good about these changes.  Pt shares that there has been some unintended consequences of these positive changes and that has mainly been an increase in anxiety about the future of her life.  She felt the need to get someone to help her clean her home deeply and she has never done that before; she wonders what that means for her moving forward.  Encouraged pt to  be as intentional as possible in order to make good choices for herself in a nurturing way.  Pt is still planning for her birthday cruise at the end of August with her husband, sister, and brother-in-law.  She continues to walk as regularly as the weather will allow.  Her husband continues to do well.  Encouraged pt to be as intentional as possible about engaging in her self care activities between now and our follow up session in 4 weeks.  Interventions: Cognitive Behavioral Therapy  Diagnosis:Major depressive disorder, recurrent, moderate (HCC)  Plan: Treatment Plan Strengths/Abilities:  Intelligent, Intuitive, Willing to participate in therapy Treatment Preferences:  Outpatient Individual Therapy Statement of Needs:  Patient is to use CBT, mindfulness and coping skills to help manage and/or decrease symptoms associated with their diagnosis. Symptoms:  Depressed/Irritable mood, worry, social withdrawal Problems Addressed:  Depressive thoughts, Sadness, Sleep issues, etc. Long Term Goals:  Pt to reduce overall level, frequency, and intensity of the feelings of depression as evidenced by decreased irritability, negative self talk, and helpless feelings from 6 to 7 days/week to 0 to 1 days/week, per client report, for at least 3 consecutive months.  Progress: 10% Short Term Goals:  Pt to verbally express understanding of the relationship between feelings of depression and their impact on thinking patterns and behaviors.  Pt to verbalize an understanding of the role  that distorted thinking plays in creating fears, excessive worry, and ruminations.  Progress: 10% Target Date:  09/01/2023 Frequency:  Bi-weekly Modality:  Cognitive Behavioral Therapy Interventions by Therapist:  Therapist will use CBT, Mindfulness exercises, Coping skills and Referrals, as needed by client. Client has verbally approved this treatment plan.  Karie Kirks, [facility]

## 2023-04-25 NOTE — Telephone Encounter (Signed)
PA has been submitted to [facility].   Thanks,   Vernona Rieger

## 2023-05-01 NOTE — Telephone Encounter (Signed)
Fax from [place], [place] 2.5mg /0.5 was approved 04/25/2023-10/27/2023.

## 2023-05-15 ENCOUNTER — Telehealth: Payer: Managed Care, Other (non HMO) | Admitting: Family Medicine

## 2023-05-15 ENCOUNTER — Encounter: Payer: Self-pay | Admitting: Family Medicine

## 2023-05-15 DIAGNOSIS — T753XXA Motion sickness, initial encounter: Secondary | ICD-10-CM | POA: Diagnosis not present

## 2023-05-15 MED ORDER — SCOPOLAMINE 1 MG/3DAYS TD PT72SCOPOLAMINE 1 MG/3DAYS
1.0000 | MEDICATED_PATCH | TRANSDERMAL | 1 refills | Status: DC
Start: 2023-05-15 — End: 2023-11-06

## 2023-05-15 NOTE — Patient Instructions (Addendum)
Scopolamine Patches What is this medication? SCOPOLAMINE (skoe POL a meen) prevents nausea and vomiting. It works by blocking substances in your body that may cause nausea and vomiting. It belongs to a class of medications called antiemetics. This medicine may be used for other purposes; ask your health care provider or pharmacist if you have questions. COMMON BRAND NAME(S): Transderm Scop What should I tell my care team before I take this medication? They need to know if you have any of these conditions: Are scheduled to have a gastric secretion test Glaucoma Heart disease Kidney disease Liver disease Lung or breathing disease, such as asthma Mental health condition Prostate disease Seizures Stomach or intestine problems Trouble passing urine An unusual or allergic reaction to scopolamine, atropine, other medications, foods, dyes, or preservatives Pregnant or trying to get pregnant Breast-feeding How should I use this medication? This medication is for external use only. Follow the directions on the prescription label. Wear only 1 patch at a time. Choose an area behind the ear, that is clean, dry, hairless and free from any cuts or irritation. Wipe the area with a clean dry tissue. Peel off the plastic backing of the skin patch, trying not to touch the adhesive side with your hands. Do not cut the patches. Firmly apply to the area you have chosen, with the metallic side of the patch to the skin and the tan-colored side showing. Once firmly in place, wash your hands well with soap and water. Do not get this medication in your eyes. After removing the patch, wash your hands and the area behind your ear thoroughly with soap and water. The patch will still contain some medication after use. To avoid accidental contact or ingestion by children or pets, fold the used patch in half with the sticky side together and throw away in the trash out of the reach of children and pets. If you need to use a  second patch after you remove the first, place it behind the other ear. A special MedGuide will be given to you by the pharmacist with each prescription and refill. Be sure to read this information carefully each time. Talk to your care team about the use of this medication in children. Special care may be needed. Overdosage: If you think you have taken too much of this medicine contact a poison control center or emergency room at once. NOTE: This medicine is only for you. Do not share this medicine with others. What if I miss a dose? This does not apply. This medication is not for regular use. What may interact with this medication? Alcohol Antihistamines for allergy cough and cold Atropine Certain medications for anxiety or sleep Certain medications for bladder problems, such as oxybutynin, tolterodine Certain medications for depression, such as amitriptyline, fluoxetine, sertraline Certain medications for Parkinson disease, such as benztropine, trihexyphenidyl Certain medications for seizures, such as phenobarbital, primidone Certain medications for stomach problems, such as dicyclomine, hyoscyamine General anesthetics, such as halothane, isoflurane, methoxyflurane, propofol Ipratropium Local anesthetics, such as lidocaine, pramoxine, tetracaine Medications that relax muscles for surgery Opioid medications for pain Other belladonna alkaloids Phenothiazines, such as chlorpromazine, mesoridazine, prochlorperazine, thioridazine This list may not describe all possible interactions. Give your health care provider a list of all the medicines, herbs, non-prescription drugs, or dietary supplements you use. Also tell them if you smoke, drink alcohol, or use illegal drugs. Some items may interact with your medicine. What should I watch for while using this medication? Limit contact with water while swimming  and bathing because the patch may fall off. If the patch falls off, throw it away and put a  new one behind the other ear. This medication may affect your coordination, reaction time, or judgment. Do not drive or operate machinery until you know how this medication affects you. Sit up or stand slowly to reduce the risk of dizzy or fainting spells. Drinking alcohol with this medication can increase the risk of these side effects. Your mouth may get dry. Chewing sugarless gum or sucking hard candy, and drinking plenty of water may help. Contact your care team if the problem does not go away or is severe. This medication may cause dry eyes and blurred vision. If you wear contact lenses, you may feel some discomfort. Lubricating drops may help. See your care team if the problem does not go away or is severe. If you are going to need surgery, an MRI, CT scan, or other procedure, tell your care team that you are using this medication. You may need to remove the patch before the procedure. What side effects may I notice from receiving this medication? Side effects that you should report to your care team as soon as possible: Allergic reactions--skin rash, itching, hives, swelling of the face, lips, tongue, or throat Constipation, bloating, nausea or vomiting, stomach pain, which may be signs of slow movement through the digestive tract Mood and behavior changes--anxiety, nervousness, confusion, hallucinations, irritability, hostility, thoughts of suicide or self-harm, worsening mood, feelings of depression Seizures Sudden eye pain or change in vision such as blurry vision, seeing halos around lights, vision loss Trouble passing urine Side effects that usually do not require medical attention (report to your care team if they continue or are bothersome): Confusion Dizziness Drowsiness Dry mouth Sore throat This list may not describe all possible side effects. Call your doctor for medical advice about side effects. You may report side effects to FDA at 1-800-FDA-1088. Where should I keep my  medication? Keep out of the reach of children and pets. Store at room temperature between 20 and 25 degrees C (68 and 77 degrees F). Keep this medication in the foil package until ready to use. Get rid of any unused medication after the expiration date. NOTE: This sheet is a summary. It may not cover all possible information. If you have questions about this medicine, talk to your doctor, pharmacist, or health care provider.  2024 Elsevier/Gold Standard (2021-09-23 00:00:00)   Please schedule a Follow-up Appointment to: Return if symptoms worsen or fail to improve.  If you have any other questions or concerns, please feel free to call the office or send a message through MyChart. You may also schedule an earlier appointment if necessary.  Additionally, you may be receiving a survey about your experience at our office within a few days to 1 week by e-mail or mail. We value your feedback.  Saralyn Pilar, DO [facility], [place]

## 2023-05-15 NOTE — Progress Notes (Signed)
Subjective:    Patient ID: Kara Porter, female    DOB: 06-02-1961, 62 y.o.   MRN: [id number]  Kara Porter is a 62 y.o. female presenting on 05/15/2023 for No chief complaint on file.  Virtual / Telehealth Encounter - Video Visit via MyChart The purpose of this virtual visit is to provide medical care while limiting exposure to the novel coronavirus (COVID19) for both patient and office staff.  Consent was obtained for remote visit:  Yes.   Answered questions that patient had about telehealth interaction:  Yes.   I discussed the limitations, risks, security and privacy concerns of performing an evaluation and management service by video/telephone. I also discussed with the patient that there may be a patient responsible charge related to this service. The patient expressed understanding and agreed to proceed.  Patient Location: Home Provider Location: [facility] (Office)  Participants in virtual visit: - Patient: Kara Porter - CMA: Kavin Leech CMA - Provider: Dr Althea Charon   HPI  Motion Sickness, cruise History of prior motion sickness in various situations. Has not been on cruise. Request rx for motion sickness medicine. Cruise is in 30 days approximately, going to [place] No other concerns or symptoms at this time       03/26/2023    9:23 AM 11/07/2022    8:06 AM 09/28/2022    9:31 AM  Depression screen PHQ 2/9  Decreased Interest 2 2 3   Down, Depressed, Hopeless 0 1 2  PHQ - 2 Score 2 3 5   Altered sleeping 2 1 3   Tired, decreased energy 2 3 3   Change in appetite 0 2 2  Feeling bad or failure about yourself  0 1 2  Trouble concentrating 0 1 2  Moving slowly or fidgety/restless 0 1 1  Suicidal thoughts 0 0 0  PHQ-9 Score 6 12 18   Difficult doing work/chores Very difficult Very difficult Very difficult    Social History   Tobacco Use   Smoking status: Former    Current packs/day: 0.00    Average packs/day: 0.8 packs/day for 6.0 years (4.5  ttl pk-yrs)    Types: Cigarettes    Start date: 2012    Quit date: 2018    Years since quitting: 6.5   Smokeless tobacco: Former  Building services engineer status: Never Used  Substance Use Topics   Alcohol use: Not Currently   Drug use: Never    Review of Systems Per HPI unless specifically indicated above     Objective:    LMP 11/24/2017 (Exact Date)   Wt Readings from Last 3 Encounters:  03/26/23 256 lb 6.4 oz (116.3 kg)  11/07/22 242 lb (109.8 kg)  09/28/22 240 lb (108.9 kg)    Physical Exam  Note examination was completely remotely via video observation objective data only  Gen - well-appearing, no acute distress or apparent pain, comfortable HEENT - eyes appear clear without discharge or redness Heart/Lungs - cannot examine virtually - observed no evidence of coughing or labored breathing. Abd - cannot examine virtually  Skin - face visible today- no rash Neuro - awake, alert, oriented Psych - not anxious appearing   Results for orders placed or performed in visit on 03/26/23  POCT glycosylated hemoglobin (Hb A1C)  Result Value Ref Range   Hemoglobin A1C 7.0 (A) 4.0 - 5.6 %      Assessment & Plan:   Problem List Items Addressed This Visit   None Visit Diagnoses  Motion sickness, initial encounter    -  Primary   Relevant Medications   scopolamine (TRANSDERM-SCOP) 1 MG/3DAYS       Upcoming cruise, past history of motion sickness Rx Scopolamine patch apply every 3 days, start 2-12 hr prior to cruise, #4 quantity, refill  Meds ordered this encounter  Medications   scopolamine (TRANSDERM-SCOP) 1 MG/3DAYS    Sig: Place 1 patch (1.5 mg total) onto the skin every 3 (three) days. For motion sickness. Start 2 hr before onset symptoms, up to 12 hr before    Dispense:  4 patch    Refill:  1      Follow up plan: No follow-ups on file.   Patient verbalizes understanding with the above medical recommendations including the limitation of remote medical  advice.  Specific follow-up and call-back criteria were given for patient to follow-up or seek medical care more urgently if needed.  Total duration of direct patient care provided via video conference: 7 minutes    Saralyn Pilar, DO [facility] 05/15/2023, 4:33 PM

## 2023-05-17 ENCOUNTER — Encounter: Payer: Self-pay | Admitting: Family Medicine

## 2023-05-24 ENCOUNTER — Ambulatory Visit (INDEPENDENT_AMBULATORY_CARE_PROVIDER_SITE_OTHER): Payer: 59 | Admitting: Psychology

## 2023-05-24 DIAGNOSIS — F331 Major depressive disorder, recurrent, moderate: Secondary | ICD-10-CM

## 2023-05-24 NOTE — Progress Notes (Signed)
[facility] Counselor/Therapist Progress Note  Patient ID: Kara Porter, MRN: [id number],    Date: 05/24/2023  Time Spent: 45 mins   start time: 1300   end time 1345  Treatment Type: Individual Therapy  Reported Symptoms: Pt presents for the session via Caregility video; pt grants consent for the session, stating she is in her office with no one else present; pt understands the limits of virtual sessions.  I shared with pt that I am in my office with no one else here either.  Mental Status Exam: Appearance:  Casual     Behavior: Appropriate  Motor: Normal  Speech/Language:  Clear and Coherent  Affect: Appropriate  Mood: depressed  Thought process: normal  Thought content:   WNL  Sensory/Perceptual disturbances:   WNL  Orientation: oriented to person, place, and time/date  Attention: Good  Concentration: Good  Memory: WNL  Fund of knowledge:  Good  Insight:   Good  Judgment:  Good  Impulse Control: Good   Risk Assessment: Danger to Self:  No Self-injurious Behavior: No Danger to Others: No Duty to Warn:no Physical Aggression / Violence:No  Access to Firearms a concern: No  Gang Involvement:No   Subjective: Pt shares that "I have been doing OK since our last session.  I have been trudging along doing most of the same stuff.  I did have a sleep study done and it turns out that I do have sleep apnea.  I am still trying to get my diabetes under control as well and trying to work out a couple of times per week too."  Pt shares that she is getting a little fatigued with all of the health things going on with her.  Congratulated pt on all the positive actions she is taking for herself and encouraged pt to continue with her steps.  Pt shares she has changed her diet to be more beneficial for her diabetes and that is going well.  She is also going to the gym at least a couple of times per week.  Pt shares she has started the process of looking for another job; she is  trying to be thoughtful in her approach so that she can find a job that fits her better.  She is only applying for remote positions at this point.  She wants to find a position that is a good balance for her.  She is still waiting for her father's estate to go through probate.  Pt is still planning to go on her cruise at the end of August for her birthday with her husband, her sister, and her brother-in-law.  She continues to walk as regularly as the weather will allow.  Encouraged pt to be as intentional as possible about engaging in her self care activities between now and our follow up session in 5 weeks.  Interventions: Cognitive Behavioral Therapy  Diagnosis:Major depressive disorder, recurrent, moderate (HCC)  Plan: Treatment Plan Strengths/Abilities:  Intelligent, Intuitive, Willing to participate in therapy Treatment Preferences:  Outpatient Individual Therapy Statement of Needs:  Patient is to use CBT, mindfulness and coping skills to help manage and/or decrease symptoms associated with their diagnosis. Symptoms:  Depressed/Irritable mood, worry, social withdrawal Problems Addressed:  Depressive thoughts, Sadness, Sleep issues, etc. Long Term Goals:  Pt to reduce overall level, frequency, and intensity of the feelings of depression as evidenced by decreased irritability, negative self talk, and helpless feelings from 6 to 7 days/week to 0 to 1 days/week, per client report, for at least  3 consecutive months.  Progress: 10% Short Term Goals:  Pt to verbally express understanding of the relationship between feelings of depression and their impact on thinking patterns and behaviors.  Pt to verbalize an understanding of the role that distorted thinking plays in creating fears, excessive worry, and ruminations.  Progress: 10% Target Date:  09/01/2023 Frequency:  Bi-weekly Modality:  Cognitive Behavioral Therapy Interventions by Therapist:  Therapist will use CBT, Mindfulness exercises, Coping  skills and Referrals, as needed by client. Client has verbally approved this treatment plan.  Karie Kirks, [facility]

## 2023-05-30 ENCOUNTER — Encounter: Payer: Self-pay | Admitting: Family Medicine

## 2023-06-27 ENCOUNTER — Ambulatory Visit: Payer: Managed Care, Other (non HMO) | Admitting: Family Medicine

## 2023-06-27 ENCOUNTER — Encounter: Payer: Self-pay | Admitting: Family Medicine

## 2023-06-27 VITALS — BP 118/66 | HR 74 | Ht 64.0 in | Wt 224.0 lb

## 2023-06-27 DIAGNOSIS — Z23 Encounter for immunization: Secondary | ICD-10-CM | POA: Diagnosis not present

## 2023-06-27 DIAGNOSIS — F411 Generalized anxiety disorder: Secondary | ICD-10-CM

## 2023-06-27 DIAGNOSIS — E1169 Type 2 diabetes mellitus with other specified complication: Secondary | ICD-10-CM

## 2023-06-27 DIAGNOSIS — G4733 Obstructive sleep apnea (adult) (pediatric): Secondary | ICD-10-CM | POA: Diagnosis not present

## 2023-06-27 DIAGNOSIS — F410 Panic disorder [episodic paroxysmal anxiety] without agoraphobia: Secondary | ICD-10-CM

## 2023-06-27 LAB — POCT GLYCOSYLATED HEMOGLOBIN (HGB A1C): Hemoglobin A1C: 5.6 % (ref 4.0–5.6)

## 2023-06-27 NOTE — Progress Notes (Unsigned)
Subjective:    Patient ID: Kara Porter, female    DOB: 06/26/1961, 62 y.o.   MRN: [id number]  Kara Porter is a 62 y.o. female presenting on 06/27/2023 for Diabetes   HPI  Type 2 Diabetes / Morbid Obesity BMI >44 Prior history Pre Diabetic range A1c up to 6.6 now, due today again A1c 5.7 > 6.6 (10/2022) Meds:  Mounjaro 2.5mg  weekly inj OFF Metformin - Off Ozempic/Wegovy previously CBG Still very high blood sugar AM 130-159 During day often improved  ***BMI >38 Since last visit started Mounjaro and Weight Watchers tracking, she lost 30 lbs She feels inflammation improved Eliminated beef + pork + bread, rare intake some bread. ***Future consider Mounjaro 5mg  weekly  OSA on CPAP She received her CPAP machine, setting 10 pressure. She has light weight mask that works well and she feels good with it. Recently started on CPAP - Today reports that sleep apnea is well controlled. She uses the CPAP machine every night. Tolerates the machine well, and thinks that sleeps better with it and feels good. No new concerns or symptoms.    Anxiety - On occasion taking Buspar 5mg  THREE TIMES A DAY AS NEEDED, especially if wake up overnight - She has Trazodone 50mg  if need but very rarely takes  Improved ***Off Lexapro 10mg  Yoga meditation On Buspar 5mg  THREE TIMES A DAY AS NEEDED ***return to work on Monday    History with Admits panic attacks while driving on interstate when driving worse scenario for her panic.   History of suspected sleep apnea - last eval 02/2021 but she could not afford the higher deductible cost of sleep study.  Request new order now   Slow Healing - skin wounds one from derm biopsy and one from minor scratch with scabbing   IBS / Constipation/ Functional GI Extreme Bloating / Flatulance - every day   Inhaler using regularly. Has some wheezing when breathing out   Sleep study referral Gastro referral  ***Back improved w/ weight loss  Health  Maintenance: ***     06/27/2023    9:41 AM 03/26/2023    9:23 AM 11/07/2022    8:06 AM  Depression screen PHQ 2/9  Decreased Interest 1 2 2   Down, Depressed, Hopeless 0 0 1  PHQ - 2 Score 1 2 3   Altered sleeping 0 2 1  Tired, decreased energy 1 2 3   Change in appetite 0 0 2  Feeling bad or failure about yourself  0 0 1  Trouble concentrating 0 0 1  Moving slowly or fidgety/restless 0 0 1  Suicidal thoughts 0 0 0  PHQ-9 Score 2 6 12   Difficult doing work/chores Not difficult at all Very difficult Very difficult    Social History   Tobacco Use   Smoking status: Former    Current packs/day: 0.00    Average packs/day: 0.8 packs/day for 6.0 years (4.5 ttl pk-yrs)    Types: Cigarettes    Start date: 2012    Quit date: 2018    Years since quitting: 6.6   Smokeless tobacco: Former  Building services engineer status: Never Used  Substance Use Topics   Alcohol use: Not Currently   Drug use: Never    Review of Systems Per HPI unless specifically indicated above     Objective:    BP 118/66   Pulse 74   Ht 5\' 4"  (1.626 m)   Wt 224 lb (101.6 kg)   LMP 11/24/2017 (Exact Date)  SpO2 94%   BMI 38.45 kg/m   Wt Readings from Last 3 Encounters:  06/27/23 224 lb (101.6 kg)  03/26/23 256 lb 6.4 oz (116.3 kg)  11/07/22 242 lb (109.8 kg)    Physical Exam  *** Diabetic Foot Exam - Simple   No data filed      Results for orders placed or performed in visit on 06/27/23  POCT glycosylated hemoglobin (Hb A1C)  Result Value Ref Range   Hemoglobin A1C 5.6 4.0 - 5.6 %   HbA1c POC (<> result, manual entry)     HbA1c, POC (prediabetic range)     HbA1c, POC (controlled diabetic range)        Assessment & Plan:   Problem List Items Addressed This Visit     Type 2 diabetes mellitus with other specified complication (HCC) - Primary   Relevant Orders   POCT glycosylated hemoglobin (Hb A1C) (Completed)   Urine Microalbumin w/creat. ratio   Other Visit Diagnoses     Need for  influenza vaccination           No orders of the defined types were placed in this encounter.     Follow up plan: Return in about 4 months (around 10/27/2023) for 4 month fasting lab only then 1 week later Annual Physical.  Future labs ordered for ***   Saralyn Pilar, DO [facility] 06/27/2023, 9:56 AM

## 2023-06-27 NOTE — Patient Instructions (Addendum)
Thank you for coming to the office today.   Recent Labs    11/07/22 0853 03/26/23 0959 06/27/23 1003  HGBA1C 6.6* 7.0* 5.6   Keep up the great work!  30 lbs wt loss  Continue Mounjaro 2.5mg  weekly you have refills, let me know if you want to adjust dose.  Your provider would like to you have your annual eye exam. Please contact your current eye doctor or here are some good options for you to contact.   [facility]   Address: [address] [place], [place] [address] Phone: [phone]  Website: [email or web address]   [facility] [address] [place], [place] [address] Phone: [phone] [email or web address]  [facility]  Address: [address] [place], [place], [place] [address] Phone: [phone]   [facility] [address] [place], [place] [address] Phone: [phone]  [facility] Address: [address] [place], [place], [place] [address]  Phone: [phone]  DUE for FASTING BLOOD WORK (no food or drink after midnight before the lab appointment, only water or coffee without cream/sugar on the morning of)  SCHEDULE "Lab Only" visit in the morning at the clinic for lab draw in 4 MONTHS   - Make sure Lab Only appointment is at about 1 week before your next appointment, so that results will be available  For Lab Results, once available within 2-3 days of blood draw, you can can log in to MyChart online to view your results and a brief explanation. Also, we can discuss results at next follow-up visit.   Please schedule a Follow-up Appointment to: Return in about 4 months (around 10/27/2023) for 4 month fasting lab only then 1 week later Annual Physical.  If you have any other questions or concerns, please feel free to call the office or send a message through MyChart. You may also schedule an earlier appointment if necessary.  Additionally, you may be receiving a survey about your experience at our office within a few days to 1 week by  e-mail or mail. We value your feedback.  Saralyn Pilar, DO [facility], [place]

## 2023-06-28 ENCOUNTER — Ambulatory Visit (INDEPENDENT_AMBULATORY_CARE_PROVIDER_SITE_OTHER): Payer: 59 | Admitting: Psychology

## 2023-06-28 ENCOUNTER — Other Ambulatory Visit: Payer: Self-pay | Admitting: Family Medicine

## 2023-06-28 DIAGNOSIS — F331 Major depressive disorder, recurrent, moderate: Secondary | ICD-10-CM | POA: Diagnosis not present

## 2023-06-28 DIAGNOSIS — E1169 Type 2 diabetes mellitus with other specified complication: Secondary | ICD-10-CM

## 2023-06-28 DIAGNOSIS — E7800 Pure hypercholesterolemia, unspecified: Secondary | ICD-10-CM

## 2023-06-28 DIAGNOSIS — E559 Vitamin D deficiency, unspecified: Secondary | ICD-10-CM

## 2023-06-28 DIAGNOSIS — Z0000 Encounter for general adult medical examination without abnormal findings: Secondary | ICD-10-CM

## 2023-06-28 LAB — MICROALBUMIN / CREATININE URINE RATIO
Creatinine, Urine: 281 mg/dL — ABNORMAL HIGH (ref 20–275)
Microalb Creat Ratio: 4 mg/g{creat} (ref <30)
Microalb, Ur: 1.2 mg/dL

## 2023-06-28 NOTE — Progress Notes (Signed)
[facility] Counselor/Therapist Progress Note  Patient ID: Kara Porter, MRN: [id number],    Date: 06/28/2023  Time Spent: 45 mins   start time: 1300   end time 1345  Treatment Type: Individual Therapy  Reported Symptoms: Pt presents for the session via Caregility video; pt grants consent for the session, stating she is in her office with no one else present; pt understands the limits of virtual sessions.  I shared with pt that I am in my office with no one else here either.  Mental Status Exam: Appearance:  Casual     Behavior: Appropriate  Motor: Normal  Speech/Language:  Clear and Coherent  Affect: Appropriate  Mood: depressed  Thought process: normal  Thought content:   WNL  Sensory/Perceptual disturbances:   WNL  Orientation: oriented to person, place, and time/date  Attention: Good  Concentration: Good  Memory: WNL  Fund of knowledge:  Good  Insight:   Good  Judgment:  Good  Impulse Control: Good   Risk Assessment: Danger to Self:  No Self-injurious Behavior: No Danger to Others: No Duty to Warn:no Physical Aggression / Violence:No  Access to Firearms a concern: No  Gang Involvement:No   Subjective: Pt shares that "I went on my birthday cruise with my husband and my sister and her husband and we had a great time.  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  I know I am taking a step back but I think that is a good choice for me.  I am a bit apprehensive about the opportunity but I am also looking forward to it.  I just had a check up yesterday with my PCP and my A1C is much better and I have lost 32 pounds and that is good for me."  Pt shares she did get her CPAP machine and has gotten used to it and she feels more rested each morning.  Pt continues to go to the gym 2-3 times per week.  Pt has made progress on closing her dad's estate.  She is going to [place] tomorrow to look into a safe deposit box her dad  had and she has to close that out.  She has other administrative things to accomplish but time will help with that.  Encouraged pt to be as intentional as possible about engaging in her self care activities between now and our follow up session in 5 weeks.  Interventions: Cognitive Behavioral Therapy  Diagnosis:Major depressive disorder, recurrent, moderate (HCC)  Plan: Treatment Plan Strengths/Abilities:  Intelligent, Intuitive, Willing to participate in therapy Treatment Preferences:  Outpatient Individual Therapy Statement of Needs:  Patient is to use CBT, mindfulness and coping skills to help manage and/or decrease symptoms associated with their diagnosis. Symptoms:  Depressed/Irritable mood, worry, social withdrawal Problems Addressed:  Depressive thoughts, Sadness, Sleep issues, etc. Long Term Goals:  Pt to reduce overall level, frequency, and intensity of the feelings of depression as evidenced by decreased irritability, negative self talk, and helpless feelings from 6 to 7 days/week to 0 to 1 days/week, per client report, for at least 3 consecutive months.  Progress: 10% Short Term Goals:  Pt to verbally express understanding of the relationship between feelings of depression and their impact on thinking patterns and behaviors.  Pt to verbalize an understanding of the role that distorted thinking plays in creating fears, excessive worry, and ruminations.  Progress: 10% Target Date:  09/01/2023 Frequency:  Bi-weekly  Modality:  Cognitive Behavioral Therapy Interventions by Therapist:  Therapist will use CBT, Mindfulness exercises, Coping skills and Referrals, as needed by client. Client has verbally approved this treatment plan.  Karie Kirks, [facility]

## 2023-07-02 ENCOUNTER — Ambulatory Visit: Payer: Managed Care, Other (non HMO) | Admitting: Family Medicine

## 2023-07-18 ENCOUNTER — Other Ambulatory Visit: Payer: Self-pay | Admitting: Family Medicine

## 2023-07-18 DIAGNOSIS — M48061 Spinal stenosis, lumbar region without neurogenic claudication: Secondary | ICD-10-CM

## 2023-08-02 ENCOUNTER — Ambulatory Visit (INDEPENDENT_AMBULATORY_CARE_PROVIDER_SITE_OTHER): Payer: 59 | Admitting: Psychology

## 2023-08-02 DIAGNOSIS — F331 Major depressive disorder, recurrent, moderate: Secondary | ICD-10-CM

## 2023-08-02 NOTE — Progress Notes (Signed)
[facility] Counselor/Therapist Progress Note  Patient ID: Kara Porter, MRN: [id number],    Date: 08/02/2023  Time Spent: 45 mins   start time: 1700   end time 1745  Treatment Type: Individual Therapy  Reported Symptoms: Pt presents for the session via Caregility video; pt grants consent for the session, stating she is in her car with no one else present; pt understands the limits of virtual sessions.  I shared with pt that I am in my office with no one else here either.  Mental Status Exam: Appearance:  Casual     Behavior: Appropriate  Motor: Normal  Speech/Language:  Clear and Coherent  Affect: Appropriate  Mood: depressed  Thought process: normal  Thought content:   WNL  Sensory/Perceptual disturbances:   WNL  Orientation: oriented to person, place, and time/date  Attention: Good  Concentration: Good  Memory: WNL  Fund of knowledge:  Good  Insight:   Good  Judgment:  Good  Impulse Control: Good   Risk Assessment: Danger to Self:  No Self-injurious Behavior: No Danger to Others: No Duty to Warn:no Physical Aggression / Violence:No  Access to Firearms a concern: No  Gang Involvement:No   Subjective: Pt shares that "I am doing OK; I have started my new job about a month ago.  The people are nice and I feel like I am fitting in well.  I know I asked for a step back but I am also adjusting to a reduced role.  I am talking myself through not having my identity wrapped up in what I am doing for work."  Pt shares she has not been able to find good balance between the job and the gym.  She will exercise some but not as consistently as she would like.  Pt is sleeping well and does not feel anxious at work.  She likes the fact that her commute is only 15 mins each way.  She is taking her breakfast and lunch daily and feels like she is doing well making good food choices for those meals.  Pt shares she and her husband did got to [place] to review the contents of her dad's  safe deposit box and it turned out that it was empty; she also went to the graveyard to visit his grave and placed flowers there.  She is glad to have that behind her now.  She needs to have her step sister put the utilities for her dad's house (where the step sister is living) into her name and to get her dad's name off of the bills.  She asked them 45 days ago and they have not done it yet.  Pt feels good about where she is the the estate process.  She shares that she is doing well in taking care of herself and nurturing herself as she needs to do.  Pt shares she has continued to lose weight and she no longer at risk for diabetes.  Her PCP wanted to increase her antidepressant but pt told him she wanted to stay where she is for now and they can talk about it at her follow up appt in January.  Encouraged pt to be as intentional as possible about engaging in her self care activities between now and our follow up session in 5 weeks.  Interventions: Cognitive Behavioral Therapy  Diagnosis:Major depressive disorder, recurrent, moderate (HCC)  Plan: Treatment Plan Strengths/Abilities:  Intelligent, Intuitive, Willing to participate in therapy Treatment Preferences:  Outpatient Individual Therapy Statement of Needs:  Patient is to use CBT, mindfulness and coping skills to help manage and/or decrease symptoms associated with their diagnosis. Symptoms:  Depressed/Irritable mood, worry, social withdrawal Problems Addressed:  Depressive thoughts, Sadness, Sleep issues, etc. Long Term Goals:  Pt to reduce overall level, frequency, and intensity of the feelings of depression as evidenced by decreased irritability, negative self talk, and helpless feelings from 6 to 7 days/week to 0 to 1 days/week, per client report, for at least 3 consecutive months.  Progress: 10% Short Term Goals:  Pt to verbally express understanding of the relationship between feelings of depression and their impact on thinking patterns and  behaviors.  Pt to verbalize an understanding of the role that distorted thinking plays in creating fears, excessive worry, and ruminations.  Progress: 10% Target Date:  09/01/2023 Frequency:  Bi-weekly Modality:  Cognitive Behavioral Therapy Interventions by Therapist:  Therapist will use CBT, Mindfulness exercises, Coping skills and Referrals, as needed by client. Client has verbally approved this treatment plan.  Karie Kirks, [facility]

## 2023-08-08 LAB — HM DIABETES EYE EXAM

## 2023-09-07 ENCOUNTER — Ambulatory Visit (INDEPENDENT_AMBULATORY_CARE_PROVIDER_SITE_OTHER): Payer: 59 | Admitting: Psychology

## 2023-09-07 DIAGNOSIS — F331 Major depressive disorder, recurrent, moderate: Secondary | ICD-10-CM | POA: Diagnosis not present

## 2023-09-07 NOTE — Progress Notes (Signed)
[facility] Counselor/Therapist Progress Note  Patient ID: Kara Porter, MRN: [id number],    Date: 09/07/2023  Time Spent: 60 mins   start time: 1300   end time 1400  Treatment Type: Individual Therapy  Reported Symptoms: Pt presents for the session via Caregility video; pt grants consent for the session, stating she is in her home with no one else present; pt understands the limits of virtual sessions.  I shared with pt that I am in my office with no one else here either.  Mental Status Exam: Appearance:  Casual     Behavior: Appropriate  Motor: Normal  Speech/Language:  Clear and Coherent  Affect: Appropriate  Mood: depressed  Thought process: normal  Thought content:   WNL  Sensory/Perceptual disturbances:   WNL  Orientation: oriented to person, place, and time/date  Attention: Good  Concentration: Good  Memory: WNL  Fund of knowledge:  Good  Insight:   Good  Judgment:  Good  Impulse Control: Good   Risk Assessment: Danger to Self:  No Self-injurious Behavior: No Danger to Others: No Duty to Warn:no Physical Aggression / Violence:No  Access to Firearms a concern: No  Gang Involvement:No   Subjective: Pt shares that "I am doing OK; the job is going OK.  There has been something that has happened with my husband.  I learned that he had gotten a hotel room for him and someone else.  I do not know who the person was but he has agreed to go to marriage therapy with me."  Pt is very disappointed in him but is very pleased with her response to him and to it.  Pt has not yet talked with anyone else about this situation.  She did talk with her husband, Kara Porter 62 yo), about the situation and he denied it at first but she found his hotel reservation.  She went to the hotel and called him on the phone; he ended up coming out of the hotel and getting angry and he ended up going to his nephew's house for the weekend (because pt called the nephew to come get him).  They  have talked several times and pt is pleased with how she is handling the situation.  Pt shares that this is her 4th marriage and his 3rd or 4th one; he has 8 children by different women.  Pt shares that her father was a serial cheater as well.  Pt shares that Kara Porter's father cheated on his mom.  Pt wants to work on her own issues around this topic and I asked her to think about things that she is good at; things that she contributes to the relationship and things that she is good at.  Encouraged pt to be as intentional as possible about engaging in her self care activities between now and our follow up session in 4 weeks.  Interventions: Cognitive Behavioral Therapy  Diagnosis:Major depressive disorder, recurrent, moderate (HCC)  Plan: Treatment Plan Strengths/Abilities:  Intelligent, Intuitive, Willing to participate in therapy Treatment Preferences:  Outpatient Individual Therapy Statement of Needs:  Patient is to use CBT, mindfulness and coping skills to help manage and/or decrease symptoms associated with their diagnosis. Symptoms:  Depressed/Irritable mood, worry, social withdrawal Problems Addressed:  Depressive thoughts, Sadness, Sleep issues, etc. Long Term Goals:  Pt to reduce overall level, frequency, and intensity of the feelings of depression as evidenced by decreased irritability, negative self talk, and helpless feelings from 6 to 7 days/week to 0 to 1 days/week, per  client report, for at least 3 consecutive months.  Progress: 10% Short Term Goals:  Pt to verbally express understanding of the relationship between feelings of depression and their impact on thinking patterns and behaviors.  Pt to verbalize an understanding of the role that distorted thinking plays in creating fears, excessive worry, and ruminations.  Progress: 10% Target Date:  08/31/2024 Frequency:  Bi-weekly Modality:  Cognitive Behavioral Therapy Interventions by Therapist:  Therapist will use CBT, Mindfulness  exercises, Coping skills and Referrals, as needed by client. Client has verbally approved this treatment plan.  Kara Porter, [facility]

## 2023-09-14 ENCOUNTER — Encounter: Payer: Self-pay | Admitting: Family Medicine

## 2023-09-14 DIAGNOSIS — E1169 Type 2 diabetes mellitus with other specified complication: Secondary | ICD-10-CM

## 2023-09-17 MED ORDER — MOUNJARO 2.5 MG/0.5ML ~~LOC~~ SOAJ
2.5000 mg | SUBCUTANEOUS | 2 refills | Status: DC
Start: 2023-09-17 — End: 2023-11-20

## 2023-10-05 ENCOUNTER — Ambulatory Visit: Payer: 59 | Admitting: Psychology

## 2023-10-05 DIAGNOSIS — F331 Major depressive disorder, recurrent, moderate: Secondary | ICD-10-CM

## 2023-10-05 NOTE — Progress Notes (Signed)
[facility] [address] [place], [place] [address]  Pulmonary Sleep Medicine   Office Visit Note  Patient Name: Kara Porter DOB: [birth year] MRN [id number]    Chief Complaint: Obstructive Sleep Apnea visit  Brief History:  Kara Porter is seen today for an initial sleep evaluation on CPAP@10cmh20 . The patient has a 5 month history of sleep apnea. Prior to CPAP the patient reports symptoms of snoring, trouble concentrating, brain fogginess and excessive fatigue. Patient is using PAP nightly.  The patient feels rested after sleeping with PAP.  The patient reports benefiting from PAP use. Reported sleepiness is  improved and the Epworth Sleepiness Score is 4 out of 24. The patient does not take naps. The patient complains of the following: none.  The compliance download shows 99% compliance with an average use time of 7.5 hours. The AHI is 0.7  The patient does not complain of limb movements disrupting sleep. She had these before going on cpap.  ROS  General: (-) fever, (-) chills, (-) night sweat Nose and Sinuses: (-) nasal stuffiness or itchiness, (-) postnasal drip, (-) nosebleeds, (-) sinus trouble. Mouth and Throat: (-) sore throat, (-) hoarseness. Neck: (-) swollen glands, (-) enlarged thyroid, (-) neck pain. Respiratory: - cough, - shortness of breath, - wheezing. Neurologic: - numbness, - tingling. Psychiatric: - anxiety, - depression   Current Medication: Outpatient Encounter Medications as of 10/08/2023  Medication Sig   albuterol (VENTOLIN HFA) 108 (90 Base) MCG/ACT inhaler TAKE 2 PUFFS BY MOUTH EVERY 6 HOURS AS NEEDED FOR WHEEZE OR SHORTNESS OF BREATH   Barberry-Oreg Grape-Goldenseal (BERBERINE COMPLEX PO) Take 2 mg by mouth daily.   busPIRone (BUSPAR) 5 MG tablet Take 1 tablet (5 mg total) by mouth 3 (three) times daily as needed (anxiety).   cholecalciferol (VITAMIN D3) 25 MCG (1000 UNIT) tablet Take 1,000 Units by mouth daily.   clotrimazole-betamethasone  (LOTRISONE) cream Apply 1-2 times a day for worsening flare intertrigo, may re-use daily up to 1 week as needed.   Fiber Adult Gummies 2 g CHEW    hydrochlorothiazide (HYDRODIURIL) 25 MG tablet Take 1 tablet (25 mg total) by mouth daily.   ipratropium (ATROVENT) 0.06 % nasal spray Place 2 sprays into both nostrils 4 (four) times daily. For up to 5-7 days then stop.   meloxicam (MOBIC) 15 MG tablet TAKE 1 TABLET (15 MG TOTAL) BY MOUTH AS NEEDED.   montelukast (SINGULAIR) 10 MG tablet Take 1 tablet (10 mg total) by mouth at bedtime.   MOUNJARO 2.5 MG/0.5ML Pen Inject 2.5 mg into the skin once a week.   Multiple Vitamin (MULTIVITAMIN) LIQD Take 5 mLs by mouth daily.   mupirocin ointment (BACTROBAN) 2 % Apply 1 Application topically 2 (two) times daily. For 2 weeks   NON FORMULARY REDICALM   nystatin (MYCOSTATIN/NYSTOP) powder Apply 1 Application topically 3 (three) times daily as needed (candidal rash). For 1-2 weeks or until resolved   scopolamine (TRANSDERM-SCOP) 1 MG/3DAYS Place 1 patch (1.5 mg total) onto the skin every 3 (three) days. For motion sickness. Start 2 hr before onset symptoms, up to 12 hr before (Patient not taking: Reported on 06/27/2023)   Turmeric (QC TUMERIC COMPLEX PO) Take 1,000 mg by mouth.   valACYclovir (VALTREX) 500 MG tablet TAKE 1 TABLET (500 MG TOTAL) BY MOUTH 3 (THREE) TIMES DAILY AS NEEDED FOR FLARE   No facility-administered encounter medications on file as of 10/08/2023.    Surgical History: Past Surgical History:  Procedure Laterality Date   CESAREAN  SECTION  1991 and 1997   GALLBLADDER SURGERY  1993   HYSTEROSCOPY  2018    Medical History: Past Medical History:  Diagnosis Date   Irritable bowel syndrome 2019    Family History: Non contributory to the present illness  Social History: Social History   Socioeconomic History   Marital status: Married    Spouse name: Not on file   Number of children: 2   Years of education: College   Highest  education level: Bachelor's degree (e.g., BA, AB, BS)  Occupational History   Occupation: HR Manager    Comment: [employer]  Tobacco Use   Smoking status: Former    Current packs/day: 0.00    Average packs/day: 0.8 packs/day for 6.0 years (4.5 ttl pk-yrs)    Types: Cigarettes    Start date: 2012    Quit date: 2018    Years since quitting: 6.9   Smokeless tobacco: Former  Building services engineer status: Never Used  Substance and Sexual Activity   Alcohol use: Not Currently   Drug use: Never   Sexual activity: Not Currently    Birth control/protection: None  Other Topics Concern   Not on file  Social History Narrative   Not on file   Social Drivers of Health   Financial Resource Strain: Low Risk  (03/22/2023)   Overall Financial Resource Strain (CARDIA)    Difficulty of Paying Living Expenses: Not hard at all  Food Insecurity: No Food Insecurity (03/22/2023)   Hunger Vital Sign    Worried About Running Out of Food in the Last Year: Never true    Ran Out of Food in the Last Year: Never true  Transportation Needs: No Transportation Needs (03/22/2023)   PRAPARE - Administrator, Civil Service (Medical): No    Lack of Transportation (Non-Medical): No  Physical Activity: Insufficiently Active (03/22/2023)   Exercise Vital Sign    Days of Exercise per Week: 1 day    Minutes of Exercise per Session: 20 min  Stress: No Stress Concern Present (03/22/2023)   [employer] of Occupational Health - Occupational Stress Questionnaire    Feeling of Stress : Only a little  Social Connections: Moderately Integrated (03/22/2023)   Social Connection and Isolation Panel [NHANES]    Frequency of Communication with Friends and Family: More than three times a week    Frequency of Social Gatherings with Friends and Family: More than three times a week    Attends Religious Services: 1 to 4 times per year    Active Member of [employer] or Organizations: No    Attends Banker Meetings: Not  on file    Marital Status: Married  Intimate Partner Violence: Unknown (01/20/2022)   Received from [employer], [facility]   HITS    Physically Hurt: Not on file    Insult or Talk Down To: Not on file    Threaten Physical Harm: Not on file    Scream or Curse: Not on file    Vital Signs: Last menstrual period 11/24/2017. There is no height or weight on file to calculate BMI.    Examination: General Appearance: The patient is well-developed, well-nourished, and in no distress. Neck Circumference: 37 cm Skin: Gross inspection of skin unremarkable. Head: normocephalic, no gross deformities. Eyes: no gross deformities noted. ENT: ears appear grossly normal Neurologic: Alert and oriented. No involuntary movements.  STOP BANG RISK ASSESSMENT S (snore) Have you been told that you snore?  NO   T (tired) Are you often tired, fatigued, or sleepy during the day?   NO  O (obstruction) Do you stop breathing, choke, or gasp during sleep? NO   P (pressure) Do you have or are you being treated for high blood pressure? NO   B (BMI) Is your body index greater than 35 kg/m? YES   A (age) Are you 101 years old or older? YES   N (neck) Do you have a neck circumference greater than 16 inches?   NO   G (gender) Are you a female? NO   TOTAL STOP/BANG "YES" ANSWERS 2       A STOP-Bang score of 2 or less is considered low risk, and a score of 5 or more is high risk for having either moderate or severe OSA. For people who score 3 or 4, doctors may need to perform further assessment to determine how likely they are to have OSA.         EPWORTH SLEEPINESS SCALE:  Scale:  (0)= no chance of dozing; (1)= slight chance of dozing; (2)= moderate chance of dozing; (3)= high chance of dozing  Chance  Situtation    Sitting and reading: 1    Watching TV: 1    Sitting Inactive in public: 0    As a passenger in car: 1      Lying down to rest: 1    Sitting and talking: 0    Sitting  quielty after lunch: 0    In a car, stopped in traffic: 0   TOTAL SCORE:   4 out of 24    SLEEP STUDIES:  PSG (04/23/2023) AHI 13.2/Hr REM 41.9/hr min SP02 81% Titration - CPAP@10cmh20    CPAP COMPLIANCE DATA:  Date Range: 07/07/2023-10/04/2023   Average Daily Use: 7.5 hours  Median Use: 7.5  Compliance for > 4 Hours: 99% days  AHI: 0.7 respiratory events per hour  Days Used: 89/90  Mask Leak: 16.2  95th Percentile Pressure: 10         LABS: Recent Results (from the past 2160 hours)  HM DIABETES EYE EXAM     Status: None   Collection Time: 08/08/23 12:00 AM  Result Value Ref Range   HM Diabetic Eye Exam No Retinopathy No Retinopathy    Comment: [facility]    Radiology: MM 3D SCREEN BREAST BILATERAL Result Date: 09/20/2022 CLINICAL DATA:  Screening. EXAM: DIGITAL SCREENING BILATERAL MAMMOGRAM WITH TOMOSYNTHESIS AND CAD TECHNIQUE: Bilateral screening digital craniocaudal and mediolateral oblique mammograms were obtained. Bilateral screening digital breast tomosynthesis was performed. The images were evaluated with computer-aided detection. COMPARISON:  Previous exam(s). ACR Breast Density Category b: There are scattered areas of fibroglandular density. FINDINGS: There are no findings suspicious for malignancy. IMPRESSION: No mammographic evidence of malignancy. A result letter of this screening mammogram will be mailed directly to the patient. RECOMMENDATION: Screening mammogram in one year. (Code:SM-B-01Y) BI-RADS CATEGORY  1: Negative. Electronically Signed   By: Harmon Pier M.D.   On: 09/20/2022 08:56    No results found.  No results found.    Assessment and Plan: Patient Active Problem List   Diagnosis Date Noted   Major depressive disorder, recurrent, moderate (HCC) 08/18/2022   Generalized anxiety disorder with panic attacks 08/18/2022   Psychophysiological insomnia 08/18/2022   Excessive daytime sleepiness 02/15/2021   OSA (obstructive  sleep apnea) 02/15/2021   Weight gain 01/24/2021   Hyperglycemia 01/24/2021   Spinal stenosis of lumbar region without neurogenic claudication  08/06/2019   Thrombocytosis 08/28/2018   Type 2 diabetes mellitus with other specified complication (HCC) 08/25/2018   Hypercholesteremia 07/25/2018   Seborrheic keratoses 07/24/2018   Morbid obesity (HCC) 07/24/2018   Fluid retention 07/24/2018   Irritable bowel syndrome 10/16/2017   1. OSA (obstructive sleep apnea) (Primary) The patient does tolerate PAP and reports  benefit from PAP use. The patient was reminded how to clean equipment and advised to replace supplies routinely. The patient was also counselled on weight loss. The compliance is excellent. The AHI is 0.7.   OSA on cpap- controlled. Continue with excellent compliance with pap. CPAP continues to be medically necessary to treat this patient's OSA. F/u 6 months  2. CPAP use counseling CPAP Counseling: had a lengthy discussion with the patient regarding the importance of PAP therapy in management of the sleep apnea. Patient appears to understand the risk factor reduction and also understands the risks associated with untreated sleep apnea. Patient will try to make a good faith effort to remain compliant with therapy. Also instructed the patient on proper cleaning of the device including the water must be changed daily if possible and use of distilled water is preferred. Patient understands that the machine should be regularly cleaned with appropriate recommended cleaning solutions that do not damage the PAP machine for example given white vinegar and water rinses. Other methods such as ozone treatment may not be as good as these simple methods to achieve cleaning.   3. Restless leg syndrome Improved with use of cpap. Continue to monitor. Advised to avoid caffeine.      General Counseling: I have discussed the findings of the evaluation and examination with Kara Porter.  I have also discussed any  further diagnostic evaluation thatmay be needed or ordered today. Kara Porter verbalizes understanding of the findings of todays visit. We also reviewed her medications today and discussed drug interactions and side effects including but not limited excessive drowsiness and altered mental states. We also discussed that there is always a risk not just to her but also people around her. she has been encouraged to call the office with any questions or concerns that should arise related to todays visit.  No orders of the defined types were placed in this encounter.       I have personally obtained a history, examined the patient, evaluated laboratory and imaging results, formulated the assessment and plan and placed orders. This patient was seen today by Emmaline Kluver, PA-C in collaboration with Dr. Freda Munro.   Yevonne Pax, MD [facility] Diplomate ABMS Pulmonary Critical Care Medicine and Sleep Medicine

## 2023-10-05 NOTE — Progress Notes (Addendum)
[facility] Counselor/Therapist Progress Note  Patient ID: Synai Magner, MRN: [id number],    Date: 10/05/2023  Time Spent: 50 mins   start time: 1500   end time 1550  Treatment Type: Individual Therapy  Reported Symptoms: Pt presents for the session via Caregility video; pt grants consent for the session, stating she is in her home with no one else present; pt understands the limits of virtual sessions.  I shared with pt that I am in my office with no one else here either.  Mental Status Exam: Appearance:  Casual     Behavior: Appropriate  Motor: Normal  Speech/Language:  Clear and Coherent  Affect: Appropriate  Mood: depressed  Thought process: normal  Thought content:   WNL  Sensory/Perceptual disturbances:   WNL  Orientation: oriented to person, place, and time/date  Attention: Good  Concentration: Good  Memory: WNL  Fund of knowledge:  Good  Insight:   Good  Judgment:  Good  Impulse Control: Good   Risk Assessment: Danger to Self:  No Self-injurious Behavior: No Danger to Others: No Duty to Warn:no Physical Aggression / Violence:No  Access to Firearms a concern: No  Gang Involvement:No   Subjective: Pt shares that "My husband and I have started marriage counseling; we have had the first session and he agreed to a second session.  The therapist is interesting and I found myself to be anxious, more because of [name].  I help back quite a bit because I did not want [name] to think I was taking things over."  Pt shares that she wants the therapy to work for them but also is aware that, if it does not fix their relationship, she will be fine.  Pt is being very intentional about considering her role in their relationship and how she can be better in it.  Pt did work on her list of things she thinks she is good and "and it was hard."  Pt believes she is a good mom, a good sister, a good listener, a good problem solver, very open minded and tolerant, good at  handling emergencies, very responsible, etc.  Pt has scheduled a massage for 1/21 and bought concert tickets for an April performance; it is unusual for her to do these future facing things; "I normally live in the moment and do not make future plans."  Encouraged pt to be as intentional as possible about engaging in her self care activities between now and our follow up session in 4 weeks.  Interventions: Cognitive Behavioral Therapy  Diagnosis:Major depressive disorder, recurrent, moderate (HCC)  Plan: Treatment Plan Strengths/Abilities:  Intelligent, Intuitive, Willing to participate in therapy Treatment Preferences:  Outpatient Individual Therapy Statement of Needs:  Patient is to use CBT, mindfulness and coping skills to help manage and/or decrease symptoms associated with their diagnosis. Symptoms:  Depressed/Irritable mood, worry, social withdrawal Problems Addressed:  Depressive thoughts, Sadness, Sleep issues, etc. Long Term Goals:  Pt to reduce overall level, frequency, and intensity of the feelings of depression as evidenced by decreased irritability, negative self talk, and helpless feelings from 6 to 7 days/week to 0 to 1 days/week, per client report, for at least 3 consecutive months.  Progress: 10% Short Term Goals:  Pt to verbally express understanding of the relationship between feelings of depression and their impact on thinking patterns and behaviors.  Pt to verbalize an understanding of the role that distorted thinking plays in creating fears, excessive worry, and ruminations.  Progress: 10% Target Date:  08/31/2024 Frequency:  Bi-weekly Modality:  Cognitive Behavioral Therapy Interventions by Therapist:  Therapist will use CBT, Mindfulness exercises, Coping skills and Referrals, as needed by client. Client has verbally approved this treatment plan.  Karie Kirks, [facility]

## 2023-10-08 ENCOUNTER — Ambulatory Visit (INDEPENDENT_AMBULATORY_CARE_PROVIDER_SITE_OTHER): Payer: Managed Care, Other (non HMO) | Admitting: Internal Medicine

## 2023-10-08 VITALS — BP 121/72 | HR 78 | Resp 16 | Ht 64.0 in | Wt 220.0 lb

## 2023-10-08 DIAGNOSIS — G2581 Restless legs syndrome: Secondary | ICD-10-CM

## 2023-10-08 DIAGNOSIS — Z7189 Other specified counseling: Secondary | ICD-10-CM

## 2023-10-08 DIAGNOSIS — G4733 Obstructive sleep apnea (adult) (pediatric): Secondary | ICD-10-CM

## 2023-10-08 NOTE — Patient Instructions (Signed)

## 2023-10-28 ENCOUNTER — Other Ambulatory Visit: Payer: Self-pay | Admitting: Family Medicine

## 2023-10-28 DIAGNOSIS — R609 Edema, unspecified: Secondary | ICD-10-CM

## 2023-10-28 DIAGNOSIS — R7303 Prediabetes: Secondary | ICD-10-CM

## 2023-10-28 DIAGNOSIS — J3089 Other allergic rhinitis: Secondary | ICD-10-CM

## 2023-10-29 ENCOUNTER — Other Ambulatory Visit: Payer: Managed Care, Other (non HMO)

## 2023-10-29 DIAGNOSIS — Z0000 Encounter for general adult medical examination without abnormal findings: Secondary | ICD-10-CM

## 2023-10-29 DIAGNOSIS — E559 Vitamin D deficiency, unspecified: Secondary | ICD-10-CM

## 2023-10-29 DIAGNOSIS — E1169 Type 2 diabetes mellitus with other specified complication: Secondary | ICD-10-CM

## 2023-10-29 DIAGNOSIS — E7800 Pure hypercholesterolemia, unspecified: Secondary | ICD-10-CM

## 2023-10-30 LAB — CBC WITH DIFFERENTIAL/PLATELET
Absolute Lymphocytes: 2527 {cells}/uL (ref 850–3900)
Absolute Monocytes: 456 {cells}/uL (ref 200–950)
Basophils Absolute: 29 {cells}/uL (ref 0–200)
Basophils Relative: 0.3 %
Eosinophils Absolute: 48 {cells}/uL (ref 15–500)
Eosinophils Relative: 0.5 %
HCT: 37.7 % (ref 35.0–45.0)
Hemoglobin: 11.9 g/dL (ref 11.7–15.5)
MCH: 27.3 pg (ref 27.0–33.0)
MCHC: 31.6 g/dL — ABNORMAL LOW (ref 32.0–36.0)
MCV: 86.5 fL (ref 80.0–100.0)
MPV: 9.8 fL (ref 7.5–12.5)
Monocytes Relative: 4.8 %
Neutro Abs: 6441 {cells}/uL (ref 1500–7800)
Neutrophils Relative %: 67.8 %
Platelets: 463 10*3/uL — ABNORMAL HIGH (ref 140–400)
RBC: 4.36 10*6/uL (ref 3.80–5.10)
RDW: 13.8 % (ref 11.0–15.0)
Total Lymphocyte: 26.6 %
WBC: 9.5 10*3/uL (ref 3.8–10.8)

## 2023-10-30 LAB — COMPLETE METABOLIC PANEL WITH GFR
AG Ratio: 1.4 (calc) (ref 1.0–2.5)
ALT: 13 U/L (ref 6–29)
AST: 16 U/L (ref 10–35)
Albumin: 4.2 g/dL (ref 3.6–5.1)
Alkaline phosphatase (APISO): 136 U/L (ref 37–153)
BUN: 13 mg/dL (ref 7–25)
CO2: 28 mmol/L (ref 20–32)
Calcium: 9.7 mg/dL (ref 8.6–10.4)
Chloride: 104 mmol/L (ref 98–110)
Creat: 0.58 mg/dL (ref 0.50–1.05)
Globulin: 3.1 g/dL (ref 1.9–3.7)
Glucose, Bld: 106 mg/dL — ABNORMAL HIGH (ref 65–99)
Potassium: 4 mmol/L (ref 3.5–5.3)
Sodium: 142 mmol/L (ref 135–146)
Total Bilirubin: 0.4 mg/dL (ref 0.2–1.2)
Total Protein: 7.3 g/dL (ref 6.1–8.1)
eGFR: 104 mL/min/{1.73_m2} (ref >=60)

## 2023-10-30 LAB — VITAMIN D 25 HYDROXY (VIT D DEFICIENCY, FRACTURES): Vit D, 25-Hydroxy: 37 ng/mL (ref 30–100)

## 2023-10-30 LAB — LIPID PANEL
Cholesterol: 202 mg/dL — ABNORMAL HIGH (ref <200)
HDL: 51 mg/dL (ref >=50)
LDL Cholesterol (Calc): 131 mg/dL — ABNORMAL HIGH
Non-HDL Cholesterol (Calc): 151 mg/dL — ABNORMAL HIGH (ref <130)
Total CHOL/HDL Ratio: 4 (calc) (ref <5.0)
Triglycerides: 96 mg/dL (ref <150)

## 2023-10-30 LAB — HEMOGLOBIN A1C
Hgb A1c MFr Bld: 5.8 %{Hb} — ABNORMAL HIGH (ref <5.7)
Mean Plasma Glucose: 120 mg/dL
eAG (mmol/L): 6.6 mmol/L

## 2023-10-30 LAB — TSH: TSH: 1.45 m[IU]/L (ref 0.40–4.50)

## 2023-10-30 NOTE — Telephone Encounter (Signed)
 Requested Prescriptions  Pending Prescriptions Disp Refills   hydrochlorothiazide  (HYDRODIURIL ) 25 MG tablet [Pharmacy Med Name: HYDROCHLOROTHIAZIDE  25 MG TAB] 90 tablet 0    Sig: TAKE 1 TABLET (25 MG TOTAL) BY MOUTH DAILY.     Cardiovascular: Diuretics - Thiazide Passed - 10/30/2023 11:17 AM      Passed - Cr in normal range and within 180 days    Creat  Date Value Ref Range Status  10/29/2023 0.58 0.50 - 1.05 mg/dL Final   Creatinine, Urine  Date Value Ref Range Status  06/27/2023 281 (H) 20 - 275 mg/dL Final         Passed - K in normal range and within 180 days    Potassium  Date Value Ref Range Status  10/29/2023 4.0 3.5 - 5.3 mmol/L Final         Passed - Na in normal range and within 180 days    Sodium  Date Value Ref Range Status  10/29/2023 142 135 - 146 mmol/L Final         Passed - Last BP in normal range    BP Readings from Last 1 Encounters:  10/08/23 121/72         Passed - Valid encounter within last 6 months    Recent Outpatient Visits           4 months ago Type 2 diabetes mellitus with other specified complication, without long-term current use of insulin (HCC)   [facility] [place], Marsa PARAS, DO   5 months ago Motion sickness, initial encounter   [place] [facility] [place], Marsa PARAS, DO   7 months ago Type 2 diabetes mellitus with other specified complication, without long-term current use of insulin [facility])   [place] [facility] [place] Marsa PARAS, DO   11 months ago Annual physical exam   [facility] [place] Marsa PARAS, DO   1 year ago Generalized anxiety disorder with panic attacks   [place] [facility] [place], Marsa PARAS, DO       Future Appointments             In 1 week [place], Marsa PARAS, DO [facility]             montelukast  (SINGULAIR ) 10 MG  tablet [Pharmacy Med Name: MONTELUKAST  SOD 10 MG TABLET] 90 tablet 0    Sig: TAKE 1 TABLET BY MOUTH EVERYDAY AT BEDTIME     Pulmonology:  Leukotriene Inhibitors Passed - 10/30/2023 11:17 AM      Passed - Valid encounter within last 12 months    Recent Outpatient Visits           4 months ago Type 2 diabetes mellitus with other specified complication, without long-term current use of insulin [facility])   [facility] [place], Marsa PARAS, DO   5 months ago Motion sickness, initial encounter   [place] [place], Marsa PARAS, DO   7 months ago Type 2 diabetes mellitus with other specified complication, without long-term current use of insulin [facility])    [facility] [place] Marsa PARAS, DO   11 months ago Annual physical exam    [facility] [place] Marsa PARAS, DO   1 year ago Generalized anxiety disorder with panic attacks    [facility] [place], Marsa  J, DO       Future Appointments             In 1 week Edman, Marsa PARAS, DO [place] [facility]             metFORMIN  (GLUCOPHAGE ) 500 MG tablet [Pharmacy Med Name: METFORMIN  HCL 500 MG TABLET] 180 tablet 3    Sig: TAKE 1 TABLET BY MOUTH 2 TIMES DAILY WITH A MEAL.     Endocrinology:  Diabetes - Biguanides Failed - 10/30/2023 11:17 AM      Failed - CBC within normal limits and completed in the last 12 months    WBC  Date Value Ref Range Status  10/29/2023 9.5 3.8 - 10.8 Thousand/uL Final   RBC  Date Value Ref Range Status  10/29/2023 4.36 3.80 - 5.10 Million/uL Final   Hemoglobin  Date Value Ref Range Status  10/29/2023 11.9 11.7 - 15.5 g/dL Final   HCT  Date Value Ref Range Status  10/29/2023 37.7 35.0 - 45.0 % Final   MCHC  Date Value Ref Range Status  10/29/2023 31.6 (L) 32.0 - 36.0 g/dL Final    Comment:    For adults, a slight  decrease in the calculated MCHC value (in the range of 30 to 32 g/dL) is most likely not clinically significant; however, it should be interpreted with caution in correlation with other red cell parameters and the patient's clinical condition.    [facility]  Date Value Ref Range Status  10/29/2023 27.3 27.0 - 33.0 pg Final   MCV  Date Value Ref Range Status  10/29/2023 86.5 80.0 - 100.0 fL Final   No results found for: PLTCOUNTKUC, LABPLAT, POCPLA RDW  Date Value Ref Range Status  10/29/2023 13.8 11.0 - 15.0 % Final         Passed - Cr in normal range and within 360 days    Creat  Date Value Ref Range Status  10/29/2023 0.58 0.50 - 1.05 mg/dL Final   Creatinine, Urine  Date Value Ref Range Status  06/27/2023 281 (H) 20 - 275 mg/dL Final         Passed - HBA1C is between 0 and 7.9 and within 180 days    Hgb A1c MFr Bld  Date Value Ref Range Status  10/29/2023 5.8 (H) <5.7 % of total Hgb Final    Comment:    For someone without known diabetes, a hemoglobin  A1c value between 5.7% and 6.4% is consistent with prediabetes and should be confirmed with a  follow-up test. . For someone with known diabetes, a value <7% indicates that their diabetes is well controlled. A1c targets should be individualized based on duration of diabetes, age, comorbid conditions, and other considerations. . This assay result is consistent with an increased risk of diabetes. . Currently, no consensus exists regarding use of hemoglobin A1c for diagnosis of diabetes for children. .          Passed - eGFR in normal range and within 360 days    GFR, Est African American  Date Value Ref Range Status  08/18/2020 119 > OR = 60 mL/min/1.33m2 Final   GFR, Est Non African American  Date Value Ref Range Status  08/18/2020 102 > OR = 60 mL/min/1.16m2 Final   eGFR  Date Value Ref Range Status  10/29/2023 104 > OR = 60 mL/min/1.74m2 Final         Passed - B12 Level in normal range and  within  720 days    Vitamin B-12  Date Value Ref Range Status  11/07/2022 684 200 - 1,100 pg/mL Final         Passed - Valid encounter within last 6 months    Recent Outpatient Visits           4 months ago Type 2 diabetes mellitus with other specified complication, without long-term current use of insulin [facility])   [place] [facility] [place], Marsa PARAS, DO   5 months ago Motion sickness, initial encounter   [place] [facility] [place], Marsa PARAS, DO   7 months ago Type 2 diabetes mellitus with other specified complication, without long-term current use of insulin [facility])   [place] [facility] [place] Mars[place] PARAS, DO   11 months ago Annual physical exam   [place] [facility] [place] Mars[place] PARAS, DO   1 year ago Generalized anxiety disorder with panic attacks   [place] [facility] [place], Marsa PARAS, DO       Future Appointments             In 1 week Edman, Marsa PARAS, DO [place] [facility]

## 2023-11-02 ENCOUNTER — Ambulatory Visit (INDEPENDENT_AMBULATORY_CARE_PROVIDER_SITE_OTHER): Payer: 59 | Admitting: Psychology

## 2023-11-02 DIAGNOSIS — F331 Major depressive disorder, recurrent, moderate: Secondary | ICD-10-CM | POA: Diagnosis not present

## 2023-11-02 NOTE — Progress Notes (Signed)
[facility] Counselor/Therapist Progress Note  Patient ID: Kara Porter, MRN: [id number],    Date: 11/02/2023  Time Spent: 60 mins   start time: 1500   end time 1600  Treatment Type: Individual Therapy  Reported Symptoms: Pt presents for the session via Caregility video; pt grants consent for the session, stating she is in her home with no one else present; pt understands the limits of virtual sessions.  I shared with pt that I am in my office with no one else here either.  Mental Status Exam: Appearance:  Casual     Behavior: Appropriate  Motor: Normal  Speech/Language:  Clear and Coherent  Affect: Appropriate  Mood: depressed  Thought process: normal  Thought content:   WNL  Sensory/Perceptual disturbances:   WNL  Orientation: oriented to person, place, and time/date  Attention: Good  Concentration: Good  Memory: WNL  Fund of knowledge:  Good  Insight:   Good  Judgment:  Good  Impulse Control: Good   Risk Assessment: Danger to Self:  No Self-injurious Behavior: No Danger to Others: No Duty to Warn:no Physical Aggression / Violence:No  Access to Firearms a concern: No  Gang Involvement:No   Subjective: Pt shares that "Christmas was good for [place].  My mom and her husband came up from [facility] and she enjoyed being here and seeing the grand kids and everyone.  Things weren't great between me and [name] during the holidays.  We did go to the first couples therapy session and we have another one scheduled for Tuesday.  I am not sure what is going to happen with that part of my life."  Pt shares that her daughter said that it seemed that both of them feel like they don't want to be there but neither of them wants to be the one to make a decision.  Pt shares that she feels like she had always lived in chaos and had hoped that this part of her life would be more stable and secure.  This situation with [name] has removed that sense of stability.  She looked at [facility]  phone last night and he had a text in there that said "I love you" with a heart emogi.  She confronted [name] about it and he deflected and denied.  She got so upset that she left the home at 10 pm last night and then called him back and asked him what is going on with him.  He spent time telling her this story and that story; he eventually said he was willing to sign divorce papers, if that is what she chooses.  Pt shares she has been going to a church in [place] and she has been enjoying that time.  [name] did go once with him and did not want to go back.  She believes that he is just an unhappy person for whatever reasons he has.  [name] did say that he is sorry that he makes her feel badly but pt was not buoyed by that apology.  She goes back to other times in her life when she felt less than in other relationships.  Pt shares that she just went through the first anniversary of her father's passing last week, "and that hit me harder than I thought it would."  Pt has been trying to be nurturing to herself but that has been a struggle for her at this time.  Pt has scheduled a massage for 1/21 and bought concert tickets for an April performance; it  is unusual for her to do these future facing things; "I normally live in the moment and do not make future plans."  Encouraged pt to be as intentional as possible about engaging in her self care activities between now and our follow up session in 3 weeks.  Interventions: Cognitive Behavioral Therapy  Diagnosis:Major depressive disorder, recurrent, moderate (HCC)  Plan: Treatment Plan Strengths/Abilities:  Intelligent, Intuitive, Willing to participate in therapy Treatment Preferences:  Outpatient Individual Therapy Statement of Needs:  Patient is to use CBT, mindfulness and coping skills to help manage and/or decrease symptoms associated with their diagnosis. Symptoms:  Depressed/Irritable mood, worry, social withdrawal Problems Addressed:  Depressive  thoughts, Sadness, Sleep issues, etc. Long Term Goals:  Pt to reduce overall level, frequency, and intensity of the feelings of depression as evidenced by decreased irritability, negative self talk, and helpless feelings from 6 to 7 days/week to 0 to 1 days/week, per client report, for at least 3 consecutive months.  Progress: 10% Short Term Goals:  Pt to verbally express understanding of the relationship between feelings of depression and their impact on thinking patterns and behaviors.  Pt to verbalize an understanding of the role that distorted thinking plays in creating fears, excessive worry, and ruminations.  Progress: 10% Target Date:  08/31/2024 Frequency:  Bi-weekly Modality:  Cognitive Behavioral Therapy Interventions by Therapist:  Therapist will use CBT, Mindfulness exercises, Coping skills and Referrals, as needed by client. Client has verbally approved this treatment plan.  Karie Kirks, [facility]

## 2023-11-06 ENCOUNTER — Ambulatory Visit (INDEPENDENT_AMBULATORY_CARE_PROVIDER_SITE_OTHER): Payer: Managed Care, Other (non HMO) | Admitting: Family Medicine

## 2023-11-06 ENCOUNTER — Encounter: Payer: Self-pay | Admitting: Family Medicine

## 2023-11-06 VITALS — BP 110/68 | HR 66 | Ht 64.0 in | Wt 214.0 lb

## 2023-11-06 DIAGNOSIS — E7800 Pure hypercholesterolemia, unspecified: Secondary | ICD-10-CM

## 2023-11-06 DIAGNOSIS — Z1231 Encounter for screening mammogram for malignant neoplasm of breast: Secondary | ICD-10-CM

## 2023-11-06 DIAGNOSIS — B009 Herpesviral infection, unspecified: Secondary | ICD-10-CM

## 2023-11-06 DIAGNOSIS — F411 Generalized anxiety disorder: Secondary | ICD-10-CM

## 2023-11-06 DIAGNOSIS — E1169 Type 2 diabetes mellitus with other specified complication: Secondary | ICD-10-CM

## 2023-11-06 DIAGNOSIS — Z0000 Encounter for general adult medical examination without abnormal findings: Secondary | ICD-10-CM | POA: Diagnosis not present

## 2023-11-06 DIAGNOSIS — F410 Panic disorder [episodic paroxysmal anxiety] without agoraphobia: Secondary | ICD-10-CM

## 2023-11-06 DIAGNOSIS — J3089 Other allergic rhinitis: Secondary | ICD-10-CM

## 2023-11-06 DIAGNOSIS — Z8249 Family history of ischemic heart disease and other diseases of the circulatory system: Secondary | ICD-10-CM

## 2023-11-06 DIAGNOSIS — R609 Edema, unspecified: Secondary | ICD-10-CM

## 2023-11-06 MED ORDER — MONTELUKAST SODIUM 10 MG PO TABS: 10.0000 mg | ORAL_TABLET | Freq: Every day | ORAL | 3 refills | Status: DC

## 2023-11-06 MED ORDER — BUSPIRONE HCL 5 MG PO TABS: 5.0000 mg | ORAL_TABLET | Freq: Three times a day (TID) | ORAL | 2 refills | Status: DC | PRN

## 2023-11-06 MED ORDER — HYDROCHLOROTHIAZIDE 25 MG PO TABS: 25.0000 mg | ORAL_TABLET | Freq: Every day | ORAL | 3 refills | Status: DC

## 2023-11-06 MED ORDER — VALACYCLOVIR HCL 500 MG PO TABS: ORAL_TABLET | ORAL | 2 refills | Status: DC

## 2023-11-06 NOTE — Progress Notes (Signed)
Subjective:    Patient ID: Kara Porter, female    DOB: 1961-06-13, 63 y.o.   MRN: [id number]  Kara Porter is a 63 y.o. female presenting on 11/06/2023 for Annual Exam   HPI  Discussed the use of AI scribe software for clinical note transcription with the patient, who gave verbal consent to proceed.  History of Present Illness        Type 2 Diabetes / Morbid Obesity BMI >36 Controlled DM Well controlled A1c 5.8, previous 5.6 Meds:  Mounjaro 2.5mg  weekly inj, prefers the low dose, since she is improving her lifestyle mostly OFF Metformin - Off Ozempic/Wegovy previously  HYPERLIPIDEMIA: - Reports no concerns. Last lipid panel 10/2023, controlled down to LDL 131 Fam history of hyperlipidemia Not on statin Improving lifestyle and managing weight  Morbid Obesity BMI >36 Significant improved wt   OSA on CPAP She received her CPAP machine, setting 10 pressure. She has light weight mask that works well and she feels good with it. Recently started on CPAP - Today reports that sleep apnea is well controlled. She uses the CPAP machine every night. Tolerates the machine well, and thinks that sleeps better with it and feels good. No new concerns or symptoms.   Anxiety Improved overall. Off Lexapro 10mg  Doing Yoga meditation Still struggling with anxiety. Still doing counseling now 1 x month, instead of every 3 weeks. Multiple stressors, family stress Need re order Buspar     IBS / Constipation/ Functional GI Extreme Bloating / Flatulance - every day   Back improved w/ weight loss Concern with herniated disc Improved w/ yoga, walking, still has flares in back Considering surgery Taking Meloxicam Prior ESI   Health Maintenance:  Due for next Mammogram anytime, last was 09/2022. Negative. Order is in today.  Shingrix  due in future.  Cologuard 12/2022, negative, next due 12/2025.  Next Cervical CA Screen pap smear 2027.     11/06/2023    8:20 AM 06/27/2023    9:41  AM 03/26/2023    9:23 AM  Depression screen PHQ 2/9  Decreased Interest 1 1 2   Down, Depressed, Hopeless 1 0 0  PHQ - 2 Score 2 1 2   Altered sleeping 1 0 2  Tired, decreased energy 1 1 2   Change in appetite 0 0 0  Feeling bad or failure about yourself  1 0 0  Trouble concentrating 1 0 0  Moving slowly or fidgety/restless 0 0 0  Suicidal thoughts 0 0 0  PHQ-9 Score 6 2 6   Difficult doing work/chores  Not difficult at all Very difficult       11/06/2023    8:20 AM 06/27/2023    9:41 AM 11/07/2022    8:06 AM 09/28/2022    9:32 AM  GAD 7 : Generalized Anxiety Score  Nervous, Anxious, on Edge 1 1 1 2   Control/stop worrying 0 0 1 2  Worry too much - different things 1 1 1 2   Trouble relaxing 1 1 1 1   Restless 0 0 0 1  Easily annoyed or irritable 1 1 2 1   Afraid - awful might happen 0 0 1 2  Total GAD 7 Score 4 4 7 11   Anxiety Difficulty Not difficult at all  Somewhat difficult Somewhat difficult     Past Medical History:  Diagnosis Date   Irritable bowel syndrome 2019   Past Surgical History:  Procedure Laterality Date   CESAREAN SECTION  1991 and 1997   GALLBLADDER SURGERY  1993  HYSTEROSCOPY  2018   Social History   Socioeconomic History   Marital status: Married    Spouse name: Not on file   Number of children: 2   Years of education: College   Highest education level: Bachelor's degree (e.g., BA, AB, BS)  Occupational History   Occupation: HR Manager    Comment: [employer]  Tobacco Use   Smoking status: Former    Current packs/day: 0.00    Average packs/day: 0.8 packs/day for 6.0 years (4.5 ttl pk-yrs)    Types: Cigarettes    Start date: 2012    Quit date: 2018    Years since quitting: 7.0   Smokeless tobacco: Former  Building services engineer status: Never Used  Substance and Sexual Activity   Alcohol use: Not Currently   Drug use: Never   Sexual activity: Not Currently    Birth control/protection: None  Other Topics Concern   Not on file  Social History  Narrative   Not on file   Social Drivers of Health   Financial Resource Strain: Low Risk  (11/05/2023)   Overall Financial Resource Strain (CARDIA)    Difficulty of Paying Living Expenses: Not very hard  Food Insecurity: No Food Insecurity (11/05/2023)   Hunger Vital Sign    Worried About Running Out of Food in the Last Year: Never true    Ran Out of Food in the Last Year: Never true  Transportation Needs: No Transportation Needs (11/05/2023)   PRAPARE - Administrator, Civil Service (Medical): No    Lack of Transportation (Non-Medical): No  Physical Activity: Insufficiently Active (11/05/2023)   Exercise Vital Sign    Days of Exercise per Week: 2 days    Minutes of Exercise per Session: 30 min  Stress: No Stress Concern Present (11/05/2023)   [employer] of Occupational Health - Occupational Stress Questionnaire    Feeling of Stress : Only a little  Social Connections: Moderately Integrated (11/05/2023)   Social Connection and Isolation Panel [NHANES]    Frequency of Communication with Friends and Family: More than three times a week    Frequency of Social Gatherings with Friends and Family: Once a week    Attends Religious Services: More than 4 times per year    Active Member of [employer] or Organizations: No    Attends Engineer, structural: Not on file    Marital Status: Married  Intimate Partner Violence: Unknown (01/20/2022)   Received from [employer], [facility]   HITS    Physically Hurt: Not on file    Insult or Talk Down To: Not on file    Threaten Physical Harm: Not on file    Scream or Curse: Not on file   Family History  Problem Relation Age of Onset   Hyperlipidemia Mother    Heart disease Mother    Cancer Father    Hyperlipidemia Father    Breast cancer Neg Hx    Adrenal disorder Neg Hx    Current Outpatient Medications on File Prior to Visit  Medication Sig   albuterol (VENTOLIN HFA) 108 (90 Base) MCG/ACT inhaler TAKE 2 PUFFS BY  MOUTH EVERY 6 HOURS AS NEEDED FOR WHEEZE OR SHORTNESS OF BREATH   Barberry-Oreg Grape-Goldenseal (BERBERINE COMPLEX PO) Take 2 mg by mouth daily.   cholecalciferol (VITAMIN D3) 25 MCG (1000 UNIT) tablet Take 1,000 Units by mouth daily.   clotrimazole-betamethasone (LOTRISONE) cream Apply 1-2 times a day for worsening flare intertrigo, may re-use daily  up to 1 week as needed.   Fiber Adult Gummies 2 g CHEW    ipratropium (ATROVENT) 0.06 % nasal spray Place 2 sprays into both nostrils 4 (four) times daily. For up to 5-7 days then stop.   meloxicam (MOBIC) 15 MG tablet TAKE 1 TABLET (15 MG TOTAL) BY MOUTH AS NEEDED.   MOUNJARO 2.5 MG/0.5ML Pen Inject 2.5 mg into the skin once a week.   Multiple Vitamin (MULTIVITAMIN) LIQD Take 5 mLs by mouth daily.   Turmeric (QC TUMERIC COMPLEX PO) Take 1,000 mg by mouth.   mupirocin ointment (BACTROBAN) 2 % Apply 1 Application topically 2 (two) times daily. For 2 weeks (Patient not taking: Reported on 11/06/2023)   NON FORMULARY REDICALM (Patient not taking: Reported on 11/06/2023)   nystatin (MYCOSTATIN/NYSTOP) powder Apply 1 Application topically 3 (three) times daily as needed (candidal rash). For 1-2 weeks or until resolved (Patient not taking: Reported on 11/06/2023)   No current facility-administered medications on file prior to visit.    Review of Systems  Constitutional:  Negative for activity change, appetite change, chills, diaphoresis, fatigue and fever.  HENT:  Negative for congestion and hearing loss.   Eyes:  Negative for visual disturbance.  Respiratory:  Negative for cough, chest tightness, shortness of breath and wheezing.   Cardiovascular:  Negative for chest pain, palpitations and leg swelling.  Gastrointestinal:  Negative for abdominal pain, constipation, diarrhea, nausea and vomiting.  Genitourinary:  Negative for dysuria, frequency and hematuria.  Musculoskeletal:  Positive for back pain. Negative for arthralgias and neck pain.  Skin:   Negative for rash.  Neurological:  Negative for dizziness, weakness, light-headedness, numbness and headaches.  Hematological:  Negative for adenopathy.  Psychiatric/Behavioral:  Negative for behavioral problems, dysphoric mood and sleep disturbance.    Per HPI unless specifically indicated above     Objective:    BP 110/68   Pulse 66   Ht 5\' 4"  (1.626 m)   Wt 214 lb (97.1 kg)   LMP 11/24/2017 (Exact Date)   SpO2 97%   BMI 36.73 kg/m   Wt Readings from Last 3 Encounters:  11/06/23 214 lb (97.1 kg)  10/08/23 220 lb (99.8 kg)  06/27/23 224 lb (101.6 kg)    Physical Exam Vitals and nursing note reviewed.  Constitutional:      General: She is not in acute distress.    Appearance: She is well-developed. She is not diaphoretic.     Comments: Well-appearing, comfortable, cooperative  HENT:     Head: Normocephalic and atraumatic.  Eyes:     General:        Right eye: No discharge.        Left eye: No discharge.     Conjunctiva/sclera: Conjunctivae normal.     Pupils: Pupils are equal, round, and reactive to light.  Neck:     Thyroid: No thyromegaly.  Cardiovascular:     Rate and Rhythm: Normal rate and regular rhythm.     Pulses: Normal pulses.     Heart sounds: Normal heart sounds. No murmur heard. Pulmonary:     Effort: Pulmonary effort is normal. No respiratory distress.     Breath sounds: Normal breath sounds. No wheezing or rales.  Abdominal:     General: Bowel sounds are normal. There is no distension.     Palpations: Abdomen is soft. There is no mass.     Tenderness: There is no abdominal tenderness.  Musculoskeletal:        General: No tenderness.  Normal range of motion.     Cervical back: Normal range of motion and neck supple.     Right lower leg: No edema.     Left lower leg: No edema.     Comments: Upper / Lower Extremities: - Normal muscle tone, strength bilateral upper extremities 5/5, lower extremities 5/5  Lymphadenopathy:     Cervical: No cervical  adenopathy.  Skin:    General: Skin is warm and dry.     Findings: No erythema or rash.  Neurological:     Mental Status: She is alert and oriented to person, place, and time.     Comments: Distal sensation intact to light touch all extremities  Psychiatric:        Mood and Affect: Mood normal.        Behavior: Behavior normal.        Thought Content: Thought content normal.     Comments: Well groomed, good eye contact, normal speech and thoughts       Results for orders placed or performed in visit on 10/29/23  VITAMIN D 25 Hydroxy (Vit-D Deficiency, Fractures)   Collection Time: 10/29/23  8:16 AM  Result Value Ref Range   Vit D, 25-Hydroxy 37 30 - 100 ng/mL  TSH   Collection Time: 10/29/23  8:16 AM  Result Value Ref Range   TSH 1.45 0.40 - 4.50 mIU/L  CBC with Differential/Platelet   Collection Time: 10/29/23  8:16 AM  Result Value Ref Range   WBC 9.5 3.8 - 10.8 Thousand/uL   RBC 4.36 3.80 - 5.10 Million/uL   Hemoglobin 11.9 11.7 - 15.5 g/dL   HCT 27.2 53.6 - 64.4 %   MCV 86.5 80.0 - 100.0 fL   MCH 27.3 27.0 - 33.0 pg   MCHC 31.6 (L) 32.0 - 36.0 g/dL   RDW 03.4 74.2 - 59.5 %   Platelets 463 (H) 140 - 400 Thousand/uL   MPV 9.8 7.5 - 12.5 fL   Neutro Abs 6,441 1,500 - 7,800 cells/uL   Absolute Lymphocytes 2,527 850 - 3,900 cells/uL   Absolute Monocytes 456 200 - 950 cells/uL   Eosinophils Absolute 48 15 - 500 cells/uL   Basophils Absolute 29 0 - 200 cells/uL   Neutrophils Relative % 67.8 %   Total Lymphocyte 26.6 %   Monocytes Relative 4.8 %   Eosinophils Relative 0.5 %   Basophils Relative 0.3 %  COMPLETE METABOLIC PANEL WITH GFR   Collection Time: 10/29/23  8:16 AM  Result Value Ref Range   Glucose, Bld 106 (H) 65 - 99 mg/dL   BUN 13 7 - 25 mg/dL   Creat 6.38 7.56 - 4.33 mg/dL   eGFR 295 > OR = 60 JO/ACZ/6.60Y3   BUN/Creatinine Ratio SEE NOTE: 6 - 22 (calc)   Sodium 142 135 - 146 mmol/L   Potassium 4.0 3.5 - 5.3 mmol/L   Chloride 104 98 - 110 mmol/L   CO2  28 20 - 32 mmol/L   Calcium 9.7 8.6 - 10.4 mg/dL   Total Protein 7.3 6.1 - 8.1 g/dL   Albumin 4.2 3.6 - 5.1 g/dL   Globulin 3.1 1.9 - 3.7 g/dL (calc)   AG Ratio 1.4 1.0 - 2.5 (calc)   Total Bilirubin 0.4 0.2 - 1.2 mg/dL   Alkaline phosphatase (APISO) 136 37 - 153 U/L   AST 16 10 - 35 U/L   ALT 13 6 - 29 U/L  Lipid panel   Collection Time: 10/29/23  8:16 AM  Result Value Ref Range   Cholesterol 202 (H) <200 mg/dL   HDL 51 > OR = 50 mg/dL   Triglycerides 96 <528 mg/dL   LDL Cholesterol (Calc) 131 (H) mg/dL (calc)   Total CHOL/HDL Ratio 4.0 <5.0 (calc)   Non-HDL Cholesterol (Calc) 151 (H) <130 mg/dL (calc)  Hemoglobin U1L   Collection Time: 10/29/23  8:16 AM  Result Value Ref Range   Hgb A1c MFr Bld 5.8 (H) <5.7 % of total Hgb   Mean Plasma Glucose 120 mg/dL   eAG (mmol/L) 6.6 mmol/L      Assessment & Plan:   Problem List Items Addressed This Visit     Fluid retention   Relevant Medications   hydrochlorothiazide (HYDRODIURIL) 25 MG tablet   Generalized anxiety disorder with panic attacks   Relevant Medications   busPIRone (BUSPAR) 5 MG tablet   Hypercholesteremia   Relevant Medications   hydrochlorothiazide (HYDRODIURIL) 25 MG tablet   Other Relevant Orders   CT CARDIAC SCORING (SELF PAY ONLY)   Morbid obesity (HCC)   Relevant Orders   CT CARDIAC SCORING (SELF PAY ONLY)   Type 2 diabetes mellitus with other specified complication (HCC)   Controlled A1c 5.8 Complications - obesity, sleep apnea  Plan:  -Continue Mounjaro 2.5mg  weekly. -Check A1c in 6 months.  Encourage improved lifestyle - low carb, low sugar diet, reduce portion size, continue improving regular exercise Check CBG , bring log to next visit for review - Future consider statin      Relevant Orders   CT CARDIAC SCORING (SELF PAY ONLY)   Other Visit Diagnoses       Annual physical exam    -  Primary     Encounter for screening mammogram for malignant neoplasm of breast       Relevant Orders    MM 3D SCREENING MAMMOGRAM BILATERAL BREAST     Family history of heart disease       Relevant Orders   CT CARDIAC SCORING (SELF PAY ONLY)     HSV-2 (herpes simplex virus 2) infection       Relevant Medications   valACYclovir (VALTREX) 500 MG tablet     Environmental and seasonal allergies       Relevant Medications   montelukast (SINGULAIR) 10 MG tablet        Updated Health Maintenance information Reviewed recent lab results with patient Encouraged improvement to lifestyle with diet and exercise Goal of weight loss   Breast Cancer Screening Last mammogram was on December 2023. Discussed the importance of regular mammograms. -Order for mammogram placed. Patient to schedule appointment. [facility]  Colorectal Cancer Screening Last Cologuard test was in March 2024 and was negative. Discussed the benefits of regular Cologuard testing and the conditions under which a colonoscopy would be recommended. -Next Cologuard due in March 2027.  Cervical Cancer Screening Last Pap smear was in 2022 and was negative. Discussed the current guidelines for Pap smear frequency. -Next Pap smear due in 2027.  Hyperlipidemia Discussed the patient's cholesterol levels and the potential benefits of a coronary artery calcium score. -Consider coronary artery calcium score.  Anxiety Patient reports ongoing struggles with anxiety despite counseling. Previously prescribed Buspar (as needed) and Lexapro. -Resume Buspar 5mg  three times a day as needed.  Chronic Back Pain / Lumbar Herniated Disc Patient reports ongoing back pain despite conservative management. Discussed the potential benefits and risks of back surgery. -Consider focused visit to discuss herniated disc and potential referral for surgical consultation.  General Health Maintenance / Followup Plans -Continue current medications including Valtrex and Montelukast. Refills provided. -Check blood pressure, cholesterol, and kidney function  in 6 months. -Consider dermatology consultation for skin lesion on face. -Follow-up appointment scheduled for May 06, 2024.         Orders Placed This Encounter  Procedures   MM 3D SCREENING MAMMOGRAM BILATERAL BREAST    Standing Status:   Future    Expiration Date:   11/05/2024    Reason for Exam (SYMPTOM  OR DIAGNOSIS REQUIRED):   Screening bilateral 3D Mammogram Tomo    Preferred imaging location?:   [facility]   CT CARDIAC SCORING (SELF PAY ONLY)    Standing Status:   Future    Expiration Date:   11/05/2024    Is patient pregnant?:   No    Preferred imaging location?:   [facility]    Meds ordered this encounter  Medications   valACYclovir (VALTREX) 500 MG tablet    Sig: TAKE 1 TABLET (500 MG TOTAL) BY MOUTH 3 (THREE) TIMES DAILY AS NEEDED FOR FLARE    Dispense:  30 tablet    Refill:  2   hydrochlorothiazide (HYDRODIURIL) 25 MG tablet    Sig: Take 1 tablet (25 mg total) by mouth daily.    Dispense:  90 tablet    Refill:  3    Add extra refills   montelukast (SINGULAIR) 10 MG tablet    Sig: Take 1 tablet (10 mg total) by mouth at bedtime.    Dispense:  90 tablet    Refill:  3    Add extra refills   busPIRone (BUSPAR) 5 MG tablet    Sig: Take 1 tablet (5 mg total) by mouth 3 (three) times daily as needed (anxiety).    Dispense:  90 tablet    Refill:  2     Follow up plan: Return for 6 month DM A1c, Back / Spine updates.  Saralyn Pilar, DO [facility] 11/06/2023, 8:43 AM

## 2023-11-06 NOTE — Assessment & Plan Note (Signed)
Controlled A1c 5.8 Complications - obesity, sleep apnea  Plan:  -Continue Mounjaro 2.5mg  weekly. -Check A1c in 6 months.  Encourage improved lifestyle - low carb, low sugar diet, reduce portion size, continue improving regular exercise Check CBG , bring log to next visit for review - Future consider statin

## 2023-11-06 NOTE — Patient Instructions (Addendum)
Thank you for coming to the office today.  For Mammogram screening for breast cancer   Call the Imaging Center below anytime to schedule your own appointment now that order has been placed.  [facility] at [facility] [address] [place], [place] [address] Phone: [phone]   You have been referred for a Coronary Calcium Score Cardiac CT Scan. This is a screening test for patients aged 3-50+ with cardiovascular risk factors or who are healthy but would be interested in Cardiovascular Screening for heart disease. Even if there is a family history of heart disease, this imaging can be useful. Typically it can be done every 5+ years or at a different timeline we agree on  The scan will look at the chest and mainly focus on the heart and identify early signs of calcium build up or blockages within the heart arteries. It is not 100% accurate for identifying blockages or heart disease, but it is useful to help [place] predict who may have some early changes or be at risk in the future for a heart attack or cardiovascular problem.  The results are reviewed by a Cardiologist and they will document the results. It should become available on MyChart. Typically the results are divided into percentiles based on other patients of the same demographic and age. So it will compare your risk to others similar to you. If you have a higher score >99 or higher percentile >75%tile, it is recommended to consider Statin cholesterol therapy and or referral to Cardiologist. I will try to help explain your results and if we have questions we can contact the Cardiologist.  You will be contacted for scheduling. Usually it is done at any imaging facility through [facility], [facility] or [facility].  The cost is $99 flat fee total and it does not go through insurance, so no authorization is required.   Please schedule a  Follow-up Appointment to: Return for 6 month DM A1c, Back / Spine updates.  If you have any other questions or concerns, please feel free to call the office or send a message through MyChart. You may also schedule an earlier appointment if necessary.  Additionally, you may be receiving a survey about your experience at our office within a few days to 1 week by e-mail or mail. We value your feedback.  Saralyn Pilar, DO [facility], [place]

## 2023-11-12 ENCOUNTER — Telehealth: Payer: Self-pay

## 2023-11-12 ENCOUNTER — Other Ambulatory Visit: Payer: Self-pay | Admitting: Family Medicine

## 2023-11-12 DIAGNOSIS — E1169 Type 2 diabetes mellitus with other specified complication: Secondary | ICD-10-CM

## 2023-11-12 NOTE — Telephone Encounter (Signed)
Copied from CRM [id number]. Topic: General - Other >> Nov 12, 2023 10:42 AM Macon Large wrote: Reason for CRM: Pt reports that she needs a prior authorization for the [facility] 2.5 MG/0.5ML Pen. Cb# [phone]

## 2023-11-14 NOTE — Telephone Encounter (Signed)
Spoke to patient she will upload a copy of her insurance card for this year into her chart. And the authorization can be started after she does this.

## 2023-11-14 NOTE — Telephone Encounter (Signed)
Please check into status of the recent PA for [facility] 2.5mg  if it has been submitted.  I just received a notification from pharmacy today on this med  It says " Alternative Requested:PLAN LIMIT EXCEEDED.  "  I believe this means that they will not cover the [facility] 2.5mg  dose for long term maintenance or management.  As we have seen before it is only a temporary dose to start patients on the med. A lot of insurance only covers it for one 28 day fill in 365 days.  Could you confirm with the PA status and then notify patient about this? I would suggest new dosage rx up to [facility] 5mg  weekly and may need new PA updated for higher dose.  Let me know if questions or if you are ready for me to place [facility] 5mg  dose.  Thanks

## 2023-11-14 NOTE — Telephone Encounter (Signed)
Requested medication (s) are due for refill today:   Yes  Requested medication (s) are on the active medication list:   Yes   Future visit scheduled:   Yes.   05/06/2024   Last ordered: 09/17/2023 2 ml, 2 refills  Returned because the limit has been exceeded.   See phar. Note.  Requested Prescriptions  Pending Prescriptions Disp Refills   MOUNJARO 2.5 MG/0.5ML Pen [Pharmacy Med Name: MOUNJARO 2.5 MG/0.5 ML PEN]  2    Sig: INJECT 2.5 MG SUBCUTANEOUSLY WEEKLY     Off-Protocol Failed - 11/14/2023 11:16 AM      Failed - Medication not assigned to a protocol, review manually.      Passed - Valid encounter within last 12 months    Recent Outpatient Visits           1 week ago Annual physical exam   [place] [facility] [place], Netta Neat, DO   4 months ago Type 2 diabetes mellitus with other specified complication, without long-term current use of insulin [facility])   [place] [facility] [place], Netta Neat, DO   6 months ago Motion sickness, initial encounter   [place] [facility] Smitty Cords, DO   7 months ago Type 2 diabetes mellitus with other specified complication, without long-term current use of insulin [facility])   [place] [facility] Smitty Cords, DO   1 year ago Annual physical exam   [place] [facility] Smitty Cords, DO       Future Appointments             In 5 months Althea Charon, Netta Neat, DO  [facility], [facility]

## 2023-11-15 ENCOUNTER — Ambulatory Visit
Admission: RE | Admit: 2023-11-15 | Discharge: 2023-11-15 | Disposition: A | Discharge status: Home / Self Care | Payer: Managed Care, Other (non HMO) | Source: Ambulatory Visit | Attending: Family Medicine | Admitting: Family Medicine

## 2023-11-15 DIAGNOSIS — Z1231 Encounter for screening mammogram for malignant neoplasm of breast: Secondary | ICD-10-CM | POA: Diagnosis present

## 2023-11-15 NOTE — Telephone Encounter (Signed)
FYI For our documentation on the [facility] authorization status.  We received the paperwork requesting the lower dose.  I have completed the form to continue Mounjaro 2.5mg  weekly inj, 2mL per 30 days, since patient is successful in managing DM on this A1c 5.8, and lifestyle changes.  I called patient. If it is denied, it is because insurance requests dose increase not maintain at 2.5  She will consider dose inc to 5mg  next time, she will contact us back if we get this next denial.  Saralyn Pilar, DO [facility] 11/15/2023, 1:58 PM

## 2023-11-20 ENCOUNTER — Encounter: Payer: Self-pay | Admitting: Family Medicine

## 2023-11-20 ENCOUNTER — Inpatient Hospital Stay: Admission: RE | Admit: 2023-11-20 | Payer: Self-pay | Source: Ambulatory Visit

## 2023-11-20 DIAGNOSIS — E1169 Type 2 diabetes mellitus with other specified complication: Secondary | ICD-10-CM

## 2023-11-20 MED ORDER — MOUNJARO 5 MG/0.5ML ~~LOC~~ SOAJ: 5.0000 mg | SUBCUTANEOUS | 2 refills | Status: DC

## 2023-11-20 NOTE — Telephone Encounter (Deleted)
Approved for 365 days starting 11/20/23

## 2023-11-22 ENCOUNTER — Ambulatory Visit
Admission: RE | Admit: 2023-11-22 | Discharge: 2023-11-22 | Disposition: A | Discharge status: Home / Self Care | Payer: Self-pay | Source: Ambulatory Visit | Attending: Family Medicine | Admitting: Family Medicine

## 2023-11-22 ENCOUNTER — Encounter: Payer: Self-pay | Admitting: Family Medicine

## 2023-11-22 DIAGNOSIS — E7800 Pure hypercholesterolemia, unspecified: Secondary | ICD-10-CM | POA: Insufficient documentation

## 2023-11-22 DIAGNOSIS — E1169 Type 2 diabetes mellitus with other specified complication: Secondary | ICD-10-CM | POA: Insufficient documentation

## 2023-11-22 DIAGNOSIS — Z8249 Family history of ischemic heart disease and other diseases of the circulatory system: Secondary | ICD-10-CM | POA: Insufficient documentation

## 2023-11-23 ENCOUNTER — Ambulatory Visit: Payer: 59 | Admitting: Psychology

## 2023-11-23 DIAGNOSIS — F331 Major depressive disorder, recurrent, moderate: Secondary | ICD-10-CM | POA: Diagnosis not present

## 2023-11-23 NOTE — Progress Notes (Addendum)
 [place] [facility] Counselor/Therapist Progress Note  Patient ID: Kara Porter, MRN: [id number],    Date: 11/23/2023  Time Spent: 50 mins   start time: 1000   end time 1050  Treatment Type: Individual Therapy  Reported Symptoms: Pt presents for the session via Caregility video; pt grants consent for the session, stating she is in her home with no one else present; pt understands the limits of virtual sessions.  I shared with pt that I am in my office with no one else here either.  Mental Status Exam: Appearance:  Casual     Behavior: Appropriate  Motor: Normal  Speech/Language:  Clear and Coherent  Affect: Appropriate  Mood: depressed  Thought process: normal  Thought content:   WNL  Sensory/Perceptual disturbances:   WNL  Orientation: oriented to person, place, and time/date  Attention: Good  Concentration: Good  Memory: WNL  Fund of knowledge:  Good  Insight:   Good  Judgment:  Good  Impulse Control: Good   Risk Assessment: Danger to Self:  No Self-injurious Behavior: No Danger to Others: No Duty to Warn:no Physical Aggression / Violence:No  Access to Firearms a concern: No  Gang Involvement:No   Subjective: Pt shares that I have been going to church regularly since our last session, I have been to a [employer], I got a massage, and I have joined a small group at [employer] which is unusual for me because I am not a 'joiner', I have also joined a yoga group that starts on Monday.  Pt shares that she is really enjoying the church she is attending with her daughter and is very happy about how friendly the people have been to them.  She has met several women who were also raised [employer] and they have shared experiences and have made similar choices for themselves.  Pt shares that her relationship with Elsie is OK; she is working on herself and feels like that is helping her be more present.  Pt shares that she believes that he may still be communicating with  someone else.  She is working to be very intentional about taking care of herself and not focusing on what he is doing or not doing.  Pt shares that they did have another couples therapy session since we met last time and she shares that Elsie has said that he thinks it is helping.  They are trying to be intentional about acting on the suggestions from the therapist.  They have another session upcoming as well.  Pt shares, I am not loving work right now but I am going to stay until I have a better sense of why I am not loving it.  Encouraged pt to be as intentional as possible about engaging in her self care activities between now and our follow up session in 4 weeks.  Interventions: Cognitive Behavioral Therapy  Diagnosis:Major depressive disorder, recurrent, moderate (HCC)  Plan: Treatment Plan Strengths/Abilities:  Intelligent, Intuitive, Willing to participate in therapy Treatment Preferences:  Outpatient Individual Therapy Statement of Needs:  Patient is to use CBT, mindfulness and coping skills to help manage and/or decrease symptoms associated with their diagnosis. Symptoms:  Depressed/Irritable mood, worry, social withdrawal Problems Addressed:  Depressive thoughts, Sadness, Sleep issues, etc. Long Term Goals:  Pt to reduce overall level, frequency, and intensity of the feelings of depression as evidenced by decreased irritability, negative self talk, and helpless feelings from 6 to 7 days/week to 0 to 1 days/week, per client report, for at  least 3 consecutive months.  Progress: 10% Short Term Goals:  Pt to verbally express understanding of the relationship between feelings of depression and their impact on thinking patterns and behaviors.  Pt to verbalize an understanding of the role that distorted thinking plays in creating fears, excessive worry, and ruminations.  Progress: 10% Target Date:  08/31/2024 Frequency:  Bi-weekly Modality:  Cognitive Behavioral Therapy Interventions by  Therapist:  Therapist will use CBT, Mindfulness exercises, Coping skills and Referrals, as needed by client. Client has verbally approved this treatment plan.  [name], [facility]

## 2023-11-28 ENCOUNTER — Other Ambulatory Visit: Payer: Self-pay | Admitting: Family Medicine

## 2023-11-28 DIAGNOSIS — F410 Panic disorder [episodic paroxysmal anxiety] without agoraphobia: Secondary | ICD-10-CM

## 2023-11-29 NOTE — Telephone Encounter (Signed)
Requested Prescriptions  Refused Prescriptions Disp Refills   busPIRone (BUSPAR) 5 MG tablet [Pharmacy Med Name: BUSPIRONE HCL 5 MG TABLET] 270 tablet 1    Sig: TAKE 1 TABLET (5 MG TOTAL) BY MOUTH 3 (THREE) TIMES DAILY AS NEEDED (ANXIETY).     Psychiatry: Anxiolytics/Hypnotics - Non-controlled Passed - 11/29/2023 12:45 PM      Passed - Valid encounter within last 12 months    Recent Outpatient Visits           3 weeks ago Annual physical exam   [place] [facility] [place], Netta Neat, DO   5 months ago Type 2 diabetes mellitus with other specified complication, without long-term current use of insulin [facility])   [facility] [place], Netta Neat, DO   6 months ago Motion sickness, initial encounter   [place] [facility] Smitty Cords, DO   8 months ago Type 2 diabetes mellitus with other specified complication, without long-term current use of insulin [facility])   [place] [facility] Smitty Cords, DO   1 year ago Annual physical exam   [place] [facility] Smitty Cords, DO       Future Appointments             In 5 months Althea Charon, Netta Neat, DO [facility], [facility]

## 2023-12-21 ENCOUNTER — Ambulatory Visit: Payer: 59 | Admitting: Psychology

## 2023-12-28 ENCOUNTER — Other Ambulatory Visit: Payer: Self-pay | Admitting: Family Medicine

## 2023-12-28 ENCOUNTER — Ambulatory Visit: Payer: 59 | Admitting: Psychology

## 2023-12-28 DIAGNOSIS — F331 Major depressive disorder, recurrent, moderate: Secondary | ICD-10-CM

## 2023-12-28 DIAGNOSIS — F410 Panic disorder [episodic paroxysmal anxiety] without agoraphobia: Secondary | ICD-10-CM

## 2023-12-28 NOTE — Progress Notes (Signed)
 [place] [facility] Counselor/Therapist Progress Note  Patient ID: Kara Porter, MRN: [id number],    Date: 12/28/2023  Time Spent: 50 mins   start time: 1000   end time 1050  Treatment Type: Individual Therapy  Reported Symptoms: Pt presents for the session via Caregility video; pt grants consent for the session, stating she is in her home with no one else present; pt understands the limits of virtual sessions.  I shared with pt that I am in my office with no one else here either.  Mental Status Exam: Appearance:  Casual     Behavior: Appropriate  Motor: Normal  Speech/Language:  Clear and Coherent  Affect: Appropriate  Mood: depressed  Thought process: normal  Thought content:   WNL  Sensory/Perceptual disturbances:   WNL  Orientation: oriented to person, place, and time/date  Attention: Good  Concentration: Good  Memory: WNL  Fund of knowledge:  Good  Insight:   Good  Judgment:  Good  Impulse Control: Good   Risk Assessment: Danger to Self:  No Self-injurious Behavior: No Danger to Others: No Duty to Warn:no Physical Aggression / Violence:No  Access to Firearms a concern: No  Gang Involvement:No   Subjective: Pt shares that "I have been pretty good since our last session.  I am glad the weather is turning better.  I have been struggling with my back for the past 3 wks or so.  I have been trying to walk more as well."  Pt shares she has been going to church regularly and she is enjoying that activity.  She has also started attending a small group meeting in a person's home.  She is glad that she is pushing herself to participate.  Pt is proud of all of the things she is doing to support herself and engage in self care activities for herself.  Pt is also pursuing gardening this Spring and is wanting to start small so it stays manageable.  She is planning to plant tomatoes, lettuce, and scallions.  Pt's daughter has found an 8 wk class at [facility] that has to with canning,  baking, and preserving foods and they have registered for that.  Pt shares that [name] chose to attend church with her last week and said that he enjoyed the time.  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  She is not sure what she wants her next job to be.  Encouraged pt to take her time to consider what she wants to do next for work.  Pt shares that she and [name] did the three free sessions with the couple's therapist and they are talking about whether or not they want to continue with this particular therapist.  Encouraged pt to be as intentional as possible about engaging in her self care activities between now and our follow up session in 6 weeks, due to my vacation.  Interventions: Cognitive Behavioral Therapy  Diagnosis:Major depressive disorder, recurrent, moderate (HCC)  Plan: Treatment Plan Strengths/Abilities:  Intelligent, Intuitive, Willing to participate in therapy Treatment Preferences:  Outpatient Individual Therapy Statement of Needs:  Patient is to use CBT, mindfulness and coping skills to help manage and/or decrease symptoms associated with their diagnosis. Symptoms:  Depressed/Irritable mood, worry, social withdrawal Problems Addressed:  Depressive thoughts, Sadness, Sleep issues, etc. Long Term Goals:  Pt to reduce overall level, frequency, and intensity of the feelings of depression as evidenced by decreased irritability, negative self talk, and helpless feelings from 6 to 7 days/week to 0 to 1 days/week, per client report, for at least 3 consecutive months.  Progress: 10% Short Term Goals:  Pt to verbally express understanding of the relationship between feelings of depression and their impact on thinking patterns and behaviors.  Pt to verbalize an understanding of the role that distorted thinking plays in  creating fears, excessive worry, and ruminations.  Progress: 10% Target Date:  08/31/2024 Frequency:  Bi-weekly Modality:  Cognitive Behavioral Therapy Interventions by Therapist:  Therapist will use CBT, Mindfulness exercises, Coping skills and Referrals, as needed by client. Client has verbally approved this treatment plan.  Karie Kirks, [facility]

## 2023-12-28 NOTE — Telephone Encounter (Signed)
 Requested medications are due for refill today.  no  Requested medications are on the active medications list.  yes  Last refill. 11/06/2023 #90 2 rf  Future visit scheduled.   yes  Notes to clinic.    Pharmacy comment: REQUEST FOR 90 DAYS PRESCRIPTION. DX Code Needed.     Requested Prescriptions  Pending Prescriptions Disp Refills   busPIRone (BUSPAR) 5 MG tablet [Pharmacy Med Name: BUSPIRONE HCL 5 MG TABLET] 270 tablet 1    Sig: Take 1 tablet (5 mg total) by mouth 3 (three) times daily as needed (anxiety).     Psychiatry: Anxiolytics/Hypnotics - Non-controlled Passed - 12/28/2023  4:50 PM      Passed - Valid encounter within last 12 months    Recent Outpatient Visits           1 month ago Annual physical exam   [place] [facility] [place], Netta Neat, DO   6 months ago Type 2 diabetes mellitus with other specified complication, without long-term current use of insulin [facility])   [place] [facility] [place], Netta Neat, DO   7 months ago Motion sickness, initial encounter   [place] [facility] Smitty Cords, DO   9 months ago Type 2 diabetes mellitus with other specified complication, without long-term current use of insulin [facility])   [place] [facility] Smitty Cords, DO   1 year ago Annual physical exam   [place] [facility] Smitty Cords, DO       Future Appointments             In 4 months Althea Charon, Netta Neat, DO [place] [facility], [facility]

## 2024-01-04 LAB — OPHTHALMOLOGY REPORT-SCANNED

## 2024-02-08 ENCOUNTER — Ambulatory Visit (INDEPENDENT_AMBULATORY_CARE_PROVIDER_SITE_OTHER): Admitting: Psychology

## 2024-02-08 DIAGNOSIS — F331 Major depressive disorder, recurrent, moderate: Secondary | ICD-10-CM

## 2024-02-08 NOTE — Progress Notes (Signed)
 [place] [facility] Counselor/Therapist Progress Note  Patient ID: Kara Porter, MRN: [id number],    Date: 02/08/2024  Time Spent: 60 mins   start time: 1000   end time 1100  Treatment Type: Individual Therapy  Reported Symptoms: Pt presents for the session via Caregility video; pt grants consent for the session, stating she is in her home with no one else present; pt understands the limits of virtual sessions.  I shared with pt that I am in my office with no one else here either.  Mental Status Exam: Appearance:  Casual     Behavior: Appropriate  Motor: Normal  Speech/Language:  Clear and Coherent  Affect: Appropriate  Mood: depressed  Thought process: normal  Thought content:   WNL  Sensory/Perceptual disturbances:   WNL  Orientation: oriented to person, place, and time/date  Attention: Good  Concentration: Good  Memory: WNL  Fund of knowledge:  Good  Insight:   Good  Judgment:  Good  Impulse Control: Good   Risk Assessment: Danger to Self:  No Self-injurious Behavior: No Danger to Others: No Duty to Warn:no Physical Aggression / Violence:No  Access to Firearms a concern: No  Gang Involvement:No   Subjective: Pt shares that "I have been OK since our last session.  I feel like I have been in a season; I am hesitant to say I am depressed but I am stressed about my job and about the things going on in [place].  I am still doing things that are good for me with my grand kids, my kids, my husband Kara Porter) and I went to [facility] and that was a great trip, I am still involved in church and that is good."  Pt shares her weight is still stable but she feels fatigued with the eating plan.  She does not feel satisfied with her job but is still pretty sure she does not want the stress of a bigger job.  Pt is concerned about her younger daughter who has struggled with depression in her life and she has a daughter (33 yo) and pt is concerned about that.  The daughter has  started Kindergarten this year and she is struggling; they are talking about holding the child back in Kindergarten for another year.  He daughter has a job and she her attendance is sporadic; pt feels she is fortunate to have kept the job to this point.  Pt shares that she has been busy at home with thing after thing.  She needs a break from everything and is working to get that.  Encouraged pt to be as intentional as possible about engaging in her self care activities between now and our follow up session in 4 weeks.  Interventions: Cognitive Behavioral Therapy  Diagnosis:Major depressive disorder, recurrent, moderate (HCC)  Plan: Treatment Plan Strengths/Abilities:  Intelligent, Intuitive, Willing to participate in therapy Treatment Preferences:  Outpatient Individual Therapy Statement of Needs:  Patient is to use CBT, mindfulness and coping skills to help manage and/or decrease symptoms associated with their diagnosis. Symptoms:  Depressed/Irritable mood, worry, social withdrawal Problems Addressed:  Depressive thoughts, Sadness, Sleep issues, etc. Long Term Goals:  Pt to reduce overall level, frequency, and intensity of the feelings of depression as evidenced by decreased irritability, negative self talk, and helpless feelings from 6 to 7 days/week to 0 to 1 days/week, per client report, for at least 3 consecutive months.  Progress: 10% Short Term Goals:  Pt to verbally express understanding of the relationship between feelings  of depression and their impact on thinking patterns and behaviors.  Pt to verbalize an understanding of the role that distorted thinking plays in creating fears, excessive worry, and ruminations.  Progress: 10% Target Date:  08/31/2024 Frequency:  Bi-weekly Modality:  Cognitive Behavioral Therapy Interventions by Therapist:  Therapist will use CBT, Mindfulness exercises, Coping skills and Referrals, as needed by client. Client has verbally approved this treatment  plan.  Kara Porter, [facility]

## 2024-02-27 ENCOUNTER — Other Ambulatory Visit: Payer: Self-pay | Admitting: Family Medicine

## 2024-02-27 ENCOUNTER — Telehealth: Payer: Self-pay

## 2024-02-27 DIAGNOSIS — E1169 Type 2 diabetes mellitus with other specified complication: Secondary | ICD-10-CM

## 2024-02-27 NOTE — Telephone Encounter (Signed)
 Requested medication (s) are due for refill today - yes  Requested medication (s) are on the active medication list -yes  Future visit scheduled -yes  Last refill: 11/20/23 2ml 2RF  Notes to clinic: off protocol- provider review   Requested Prescriptions  Pending Prescriptions Disp Refills   MOUNJARO  5 MG/0.5ML Pen [Pharmacy Med Name: MOUNJARO  5 MG/0.5 ML PEN]  2    Sig: INJECT 5 MG SUBCUTANEOUSLY WEEKLY     Off-Protocol Failed - 02/27/2024 10:29 AM      Failed - Medication not assigned to a protocol, review manually.      Failed - Valid encounter within last 12 months    Recent Outpatient Visits   None     Future Appointments             In 2 months Karamalegos, Kayleen Party, DO [facility], [facility]               Requested Prescriptions  Pending Prescriptions Disp Refills   MOUNJARO  5 MG/0.5ML Pen [Pharmacy Med Name: MOUNJARO  5 MG/0.5 ML PEN]  2    Sig: INJECT 5 MG SUBCUTANEOUSLY WEEKLY     Off-Protocol Failed - 02/27/2024 10:29 AM      Failed - Medication not assigned to a protocol, review manually.      Failed - Valid encounter within last 12 months    Recent Outpatient Visits   None     Future Appointments             In 2 months Karamalegos, Kayleen Party, DO [facility] [facility], [facility]

## 2024-02-27 NOTE — Telephone Encounter (Signed)
 Spoke with patient explained to her that I had sent the refill request back to Dr. Romeo Co to approve.

## 2024-02-27 NOTE — Telephone Encounter (Signed)
 Copied from CRM [id number]. Topic: Clinical - Medication Question >> Feb 27, 2024 10:54 AM Kara Porter wrote: Reason for CRM: Patient calling to ask if the nurse completed the prescription for MOUNJARO  5 MG/0.5ML Pen for the 5mg  prescription cause this is covered by insurance, and this is the refill she would like this is the last one Dr Linnell Richardson wrote for her  Pt number [phone] (M) MOUNJARO  5 MG/0.5ML Pen

## 2024-03-07 ENCOUNTER — Ambulatory Visit (INDEPENDENT_AMBULATORY_CARE_PROVIDER_SITE_OTHER): Admitting: Psychology

## 2024-03-07 DIAGNOSIS — F331 Major depressive disorder, recurrent, moderate: Secondary | ICD-10-CM

## 2024-03-07 NOTE — Progress Notes (Signed)
 [facility] Counselor/Therapist Progress Note  Patient ID: Kara Porter, MRN: [id number],    Date: 03/07/2024  Time Spent: 60 mins   start time: 1000   end time 1100  Treatment Type: Individual Therapy  Reported Symptoms: Pt presents for the session via Caregility video; pt grants consent for the session, stating she is in her home with no one else present; pt understands the limits of virtual sessions.  I shared with pt that I am in my office with no one else here either.  Mental Status Exam: Appearance:  Casual     Behavior: Appropriate  Motor: Normal  Speech/Language:  Clear and Coherent  Affect: Appropriate  Mood: depressed  Thought process: normal  Thought content:   WNL  Sensory/Perceptual disturbances:   WNL  Orientation: oriented to person, place, and time/date  Attention: Good  Concentration: Good  Memory: WNL  Fund of knowledge:  Good  Insight:   Good  Judgment:  Good  Impulse Control: Good   Risk Assessment: Danger to Self:  No Self-injurious Behavior: No Danger to Others: No Duty to Warn:no Physical Aggression / Violence:No  Access to Firearms a concern: No  Gang Involvement:No   Subjective: Pt shares that "I have been OK since our last session.  I went to see my mom (lives in [place], [place]) and she has a pacemaker and has Afib.  She had an episode while I was there and that was a little unsettling for me."  Pt shares her mom lives with her husband Josefina Nian) and he is in good health and pt is thankful for that so he can care for her.  Pt shares that she has talked with her sister when she got back home.  Pt was glad to have spent time with her mom.  Pt reports some anxiety related to concerns for her younger daughter Jearlean Mince) as it relates to her daughter's mental illness.  Pt shares her daughter has always blamed pt for anything problematic in her daughter's life.  Pt's younger daughter has always spent money quickly; she received partial benefits  from a life insurance policy pt's father had for her and pt's other daughter.  Pt's younger daughter has a child (Amara-5 yo-kindergarten) now and pt is trying to protect her daughter and her grand daughter; pt shares that her daughter struggles to get to work regularly "and is having trouble with her attendance at work."  Pt has concerns for her daughter and especially for Emmet Harm but she is not concerned for her safety.  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  Last night was the first night and pt had a great time.  Pt shares she "gets up everyday not wanting to go to work anymore.  I am looking for a new job and have even interviewed for another job.  I was disappointed in my own performance in that interview."  Encouraged pt to give herself more grace in this situation and to be intentional about improving her performance in each subsequent interview.  She and Sammie Crigler are doing OK at this point.  Encouraged pt to be as intentional as possible about engaging in her self care activities between now and our follow up session in 4 weeks.  Interventions: Cognitive Behavioral Therapy  Diagnosis:Major depressive disorder, recurrent, moderate (HCC)  Plan: Treatment Plan Strengths/Abilities:  Intelligent, Intuitive, Willing to participate in therapy Treatment Preferences:  Outpatient Individual Therapy Statement of Needs:  Patient is  to use CBT, mindfulness and coping skills to help manage and/or decrease symptoms associated with their diagnosis. Symptoms:  Depressed/Irritable mood, worry, social withdrawal Problems Addressed:  Depressive thoughts, Sadness, Sleep issues, etc. Long Term Goals:  Pt to reduce overall level, frequency, and intensity of the feelings of depression as evidenced by decreased irritability, negative self talk, and helpless feelings from 6 to 7 days/week to 0 to 1 days/week, per client report, for at least 3 consecutive months.   Progress: 10% Short Term Goals:  Pt to verbally express understanding of the relationship between feelings of depression and their impact on thinking patterns and behaviors.  Pt to verbalize an understanding of the role that distorted thinking plays in creating fears, excessive worry, and ruminations.  Progress: 10% Target Date:  08/31/2024 Frequency:  Bi-weekly Modality:  Cognitive Behavioral Therapy Interventions by Therapist:  Therapist will use CBT, Mindfulness exercises, Coping skills and Referrals, as needed by client. Client has verbally approved this treatment plan.  Jhonny Moss, [facility]

## 2024-03-14 ENCOUNTER — Other Ambulatory Visit (HOSPITAL_COMMUNITY): Payer: Self-pay

## 2024-03-14 ENCOUNTER — Telehealth: Payer: Self-pay

## 2024-03-14 NOTE — Telephone Encounter (Signed)
 Pharmacy Patient Advocate Encounter   Received notification from CoverMyMeds that prior authorization for Mounjaro  5MG /0.5ML auto-injectorsis required/requested.   Insurance verification completed.   The patient is insured through [employer] .   Per test claim: PA required; PA submitted to above mentioned insurance via CoverMyMeds Key/confirmation #/EOC Key: [id number]) Status is pending

## 2024-03-19 ENCOUNTER — Other Ambulatory Visit (HOSPITAL_COMMUNITY): Payer: Self-pay

## 2024-03-19 NOTE — Telephone Encounter (Signed)
 Pharmacy Patient Advocate Encounter  Received notification from [employer] that Prior Authorization for Mounjaro  5MG /0.5ML auto-injectorshas been APPROVED from 5.30.25 to 6.4.26. Ran test claim, Copay is $RTS, RX WAS LAST FILLED ON 5.14.25. This test claim was processed through [facility]- copay amounts may vary at other pharmacies due to pharmacy/plan contracts, or as the patient moves through the different stages of their insurance plan.   PA #/Case ID/Reference #: (Key: [id number])

## 2024-04-04 ENCOUNTER — Ambulatory Visit: Admitting: Psychology

## 2024-04-04 DIAGNOSIS — F331 Major depressive disorder, recurrent, moderate: Secondary | ICD-10-CM

## 2024-04-04 NOTE — Progress Notes (Signed)
 [facility] [address] [place], [place] [address]  Pulmonary Sleep Medicine   Office Visit Note  Patient Name: Kara Porter DOB: 06/12/1961 MRN [id number]    Chief Complaint: Obstructive Sleep Apnea visit  Brief History:  Hildur is seen today for a 6 month follow up visit for CPAP@ 10 cmH2O. The patient has a 1 year history of sleep apnea. Patient is using PAP nightly.  The patient feels rested after sleeping with PAP.  The patient reports benefiting from PAP use. Reported sleepiness is improved and the Epworth Sleepiness Score is 4 out of 24. The patient does not take naps. The patient complains of the following: none.  The compliance download shows 99% compliance with an average use time of 7 hours 30 minutes. The AHI is 0.4.  The patient does not complain of limb movements disrupting sleep. The patient continues to require PAP therapy in order to eliminate sleep apnea.   ROS  General: (-) fever, (-) chills, (-) night sweat Nose and Sinuses: (-) nasal stuffiness or itchiness, (-) postnasal drip, (-) nosebleeds, (-) sinus trouble. Mouth and Throat: (-) sore throat, (-) hoarseness. Neck: (-) swollen glands, (-) enlarged thyroid , (-) neck pain. Respiratory: - cough, - shortness of breath, - wheezing. Neurologic: - numbness, - tingling. Psychiatric: - anxiety, - depression   Current Medication: Outpatient Encounter Medications as of 04/07/2024  Medication Sig   albuterol  (VENTOLIN  HFA) 108 (90 Base) MCG/ACT inhaler TAKE 2 PUFFS BY MOUTH EVERY 6 HOURS AS NEEDED FOR WHEEZE OR SHORTNESS OF BREATH   Barberry-Oreg Grape-Goldenseal (BERBERINE COMPLEX PO) Take 2 mg by mouth daily.   busPIRone  (BUSPAR ) 5 MG tablet TAKE 1 TABLET (5 MG TOTAL) BY MOUTH 3 (THREE) TIMES DAILY AS NEEDED (ANXIETY).   cholecalciferol (VITAMIN D3) 25 MCG (1000 UNIT) tablet Take 1,000 Units by mouth daily.   clotrimazole -betamethasone  (LOTRISONE ) cream Apply 1-2 times a day for worsening flare  intertrigo, may re-use daily up to 1 week as needed.   Fiber Adult Gummies 2 g CHEW    hydrochlorothiazide  (HYDRODIURIL ) 25 MG tablet Take 1 tablet (25 mg total) by mouth daily.   ipratropium (ATROVENT ) 0.06 % nasal spray Place 2 sprays into both nostrils 4 (four) times daily. For up to 5-7 days then stop.   meloxicam  (MOBIC ) 15 MG tablet TAKE 1 TABLET (15 MG TOTAL) BY MOUTH AS NEEDED.   montelukast  (SINGULAIR ) 10 MG tablet Take 1 tablet (10 mg total) by mouth at bedtime.   MOUNJARO  5 MG/0.5ML Pen Inject 5 mg into the skin once a week.   Multiple Vitamin (MULTIVITAMIN) LIQD Take 5 mLs by mouth daily.   mupirocin  ointment (BACTROBAN ) 2 % Apply 1 Application topically 2 (two) times daily. For 2 weeks (Patient not taking: Reported on 11/06/2023)   NON FORMULARY REDICALM (Patient not taking: Reported on 11/06/2023)   nystatin  (MYCOSTATIN /NYSTOP ) powder Apply 1 Application topically 3 (three) times daily as needed (candidal rash). For 1-2 weeks or until resolved (Patient not taking: Reported on 11/06/2023)   Turmeric (QC TUMERIC COMPLEX PO) Take 1,000 mg by mouth.   valACYclovir  (VALTREX ) 500 MG tablet TAKE 1 TABLET (500 MG TOTAL) BY MOUTH 3 (THREE) TIMES DAILY AS NEEDED FOR FLARE   No facility-administered encounter medications on file as of 04/07/2024.    Surgical History: Past Surgical History:  Procedure Laterality Date   CESAREAN SECTION  1991 and 1997   GALLBLADDER SURGERY  1993   HYSTEROSCOPY  2018    Medical History: Past Medical History:  Diagnosis Date   Irritable bowel syndrome 2019    Family History: Non contributory to the present illness  Social History: Social History   Socioeconomic History   Marital status: Married    Spouse name: Not on file   Number of children: 2   Years of education: College   Highest education level: Bachelor's degree (e.g., BA, AB, BS)  Occupational History   Occupation: HR Manager    Comment: [employer]  Tobacco Use   Smoking status: Former     Current packs/day: 0.00    Average packs/day: 0.8 packs/day for 6.0 years (4.5 ttl pk-yrs)    Types: Cigarettes    Start date: 2012    Quit date: 2018    Years since quitting: 7.4   Smokeless tobacco: Former  Building services engineer status: Never Used  Substance and Sexual Activity   Alcohol use: Not Currently   Drug use: Never   Sexual activity: Not Currently    Birth control/protection: None  Other Topics Concern   Not on file  Social History Narrative   Not on file   Social Drivers of Health   Financial Resource Strain: Low Risk  (11/05/2023)   Overall Financial Resource Strain (CARDIA)    Difficulty of Paying Living Expenses: Not very hard  Food Insecurity: No Food Insecurity (11/05/2023)   Hunger Vital Sign    Worried About Running Out of Food in the Last Year: Never true    Ran Out of Food in the Last Year: Never true  Transportation Needs: No Transportation Needs (11/05/2023)   PRAPARE - Administrator, Civil Service (Medical): No    Lack of Transportation (Non-Medical): No  Physical Activity: Insufficiently Active (11/05/2023)   Exercise Vital Sign    Days of Exercise per Week: 2 days    Minutes of Exercise per Session: 30 min  Stress: No Stress Concern Present (11/05/2023)   [employer] of Occupational Health - Occupational Stress Questionnaire    Feeling of Stress : Only a little  Social Connections: Moderately Integrated (11/05/2023)   Social Connection and Isolation Panel    Frequency of Communication with Friends and Family: More than three times a week    Frequency of Social Gatherings with Friends and Family: Once a week    Attends Religious Services: More than 4 times per year    Active Member of [employer] or Organizations: No    Attends Banker Meetings: Not on file    Marital Status: Married  Intimate Partner Violence: Unknown (01/20/2022)   Received from [facility]   HITS    Physically Hurt: Not on file    Insult or Talk Down  To: Not on file    Threaten Physical Harm: Not on file    Scream or Curse: Not on file    Vital Signs: Blood pressure 119/74, pulse 68, resp. rate 16, height 5' 4 (1.626 m), weight 215 lb (97.5 kg), last menstrual period 11/24/2017, SpO2 98%. Body mass index is 36.9 kg/m.    Examination: General Appearance: The patient is well-developed, well-nourished, and in no distress. Neck Circumference: 37 cm Skin: Gross inspection of skin unremarkable. Head: normocephalic, no gross deformities. Eyes: no gross deformities noted. ENT: ears appear grossly normal Neurologic: Alert and oriented. No involuntary movements.  STOP BANG RISK ASSESSMENT S (snore) Have you been told that you snore?     NO   T (tired) Are you often tired, fatigued, or sleepy during the day?  NO  O (obstruction) Do you stop breathing, choke, or gasp during sleep? NO   P (pressure) Do you have or are you being treated for high blood pressure? NO   B (BMI) Is your body index greater than 35 kg/m? YES   A (age) Are you 61 years old or older? YES   N (neck) Do you have a neck circumference greater than 16 inches?   NO   G (gender) Are you a female? NO   TOTAL STOP/BANG "YES" ANSWERS 2       A STOP-Bang score of 2 or less is considered low risk, and a score of 5 or more is high risk for having either moderate or severe OSA. For people who score 3 or 4, doctors may need to perform further assessment to determine how likely they are to have OSA.         EPWORTH SLEEPINESS SCALE:  Scale:  (0)= no chance of dozing; (1)= slight chance of dozing; (2)= moderate chance of dozing; (3)= high chance of dozing  Chance  Situtation    Sitting and reading: 1    Watching TV: 1    Sitting Inactive in public: 0    As a passenger in car: 1      Lying down to rest: 1    Sitting and talking: 0    Sitting quielty after lunch: 0    In a car, stopped in traffic: 0   TOTAL SCORE:   4 out of 24    SLEEP  STUDIES:  PSG (04/23/2023) AHI 13.2/Hr REM 41.9/hr min SP02 81% Titration - CPAP@10cmh20    CPAP COMPLIANCE DATA:  Date Range: 10/07/2023-04/03/2024  Average Daily Use: 7 hours 30 minutes  Median Use: 7 hours 34 minutes  Compliance for > 4 Hours: 99%  AHI: 0.4 respiratory events per hour  Days Used: 179/180 days  Mask Leak: 19.8  95th Percentile Pressure: 10         LABS: No results found for this or any previous visit (from the past 2160 hours).  Radiology: CT CARDIAC SCORING (SELF PAY ONLY) Addendum Date: 12/01/2023 ADDENDUM REPORT: 12/01/2023 22:31 EXAM: OVER-READ INTERPRETATION CT CHEST The following report is an over-read performed by radiologist Dr. Norman Hopper of [facility] on 12/01/2023. This over-read does not include interpretation of cardiac or coronary anatomy or pathology. The coronary calcium score interpretation by the cardiologist is attached. COMPARISON:  None. FINDINGS: Cardiovascular: No significant extracardiac vascular findings. Normal heart size. No pericardial effusion. Mediastinum/Nodes: No enlarged mediastinal lymph nodes. The visible trachea and esophagus demonstrate no significant findings. Lungs/Pleura: The visible lungs are clear. No pleural effusion. Upper Abdomen: No acute abnormality. Musculoskeletal: No chest wall mass or suspicious bone lesions identified. IMPRESSION: No significant extracardiac findings. Electronically Signed   By: Norman Hopper M.D.   On: 12/01/2023 22:31   Result Date: 12/01/2023 CLINICAL DATA:  Risk stratification EXAM: Coronary Calcium Score TECHNIQUE: The patient was scanned on a Siemens Somatom scanner. Axial non-contrast 3 mm slices were carried out through the heart. The data set was analyzed on a dedicated work station and scored using the Agatson method. FINDINGS: Non-cardiac: See separate report from [facility]. Ascending Aorta: Normal size Pericardium: Normal Coronary arteries: Normal origin of  left and right coronary arteries. Distribution of arterial calcifications if present, as noted below; LM 0 LAD 0 LCx 0 RCA 0 Total 0 IMPRESSION AND RECOMMENDATION: 1. Coronary calcium score of 0. 2. CAC 0, CAC-DRS A0. 3. Continue heart  healthy lifestyle and risk factor modification. Electronically Signed: By: Redell Cave M.D. On: 11/22/2023 15:05    No results found.  No results found.    Assessment and Plan: Patient Active Problem List   Diagnosis Date Noted   CPAP use counseling 10/08/2023   Restless leg syndrome 10/08/2023   Major depressive disorder, recurrent, moderate (HCC) 08/18/2022   Generalized anxiety disorder with panic attacks 08/18/2022   Psychophysiological insomnia 08/18/2022   Excessive daytime sleepiness 02/15/2021   OSA (obstructive sleep apnea) 02/15/2021   Weight gain 01/24/2021   Hyperglycemia 01/24/2021   Spinal stenosis of lumbar region without neurogenic claudication 08/06/2019   Thrombocytosis 08/28/2018   Type 2 diabetes mellitus with other specified complication (HCC) 08/25/2018   Hypercholesteremia 07/25/2018   Seborrheic keratoses 07/24/2018   Morbid obesity (HCC) 07/24/2018   Fluid retention 07/24/2018   Irritable bowel syndrome 10/16/2017   1. OSA (obstructive sleep apnea) (Primary) The patient does tolerate PAP and reports significant  benefit from PAP use. The patient was reminded how to clean equipment and advised to replace supplies routinely. The patient was also counselled on weight loss. The compliance is excellent. The AHI is 0.4.   OSA on cpap- controlled. Continue with excellent compliance with pap. CPAP continues to be medically necessary to treat this patient's OSA. F/u one year.     2. CPAP use counseling CPAP Counseling: had a lengthy discussion with the patient regarding the importance of PAP therapy in management of the sleep apnea. Patient appears to understand the risk factor reduction and also understands the risks  associated with untreated sleep apnea. Patient will try to make a good faith effort to remain compliant with therapy. Also instructed the patient on proper cleaning of the device including the water must be changed daily if possible and use of distilled water is preferred. Patient understands that the machine should be regularly cleaned with appropriate recommended cleaning solutions that do not damage the PAP machine for example given white vinegar and water rinses. Other methods such as ozone treatment may not be as good as these simple methods to achieve cleaning.   3. Restless leg syndrome For the most part asymptomatic. She has occasional achiness in her legs but not true restlessness, which she had prior to cpap. Suggested tylenol.      General Counseling: I have discussed the findings of the evaluation and examination with Donzell.  I have also discussed any further diagnostic evaluation thatmay be needed or ordered today. Brion verbalizes understanding of the findings of todays visit. We also reviewed her medications today and discussed drug interactions and side effects including but not limited excessive drowsiness and altered mental states. We also discussed that there is always a risk not just to her but also people around her. she has been encouraged to call the office with any questions or concerns that should arise related to todays visit.  No orders of the defined types were placed in this encounter.       I have personally obtained a history, examined the patient, evaluated laboratory and imaging results, formulated the assessment and plan and placed orders. This patient was seen today by Lauraine Lay, PA-C in collaboration with Dr. Elfreda Bathe.   Elfreda DELENA Bathe, MD [facility] Diplomate ABMS Pulmonary Critical Care Medicine and Sleep Medicine

## 2024-04-04 NOTE — Progress Notes (Signed)
 [facility] Counselor/Therapist Progress Note  Patient ID: Kara Porter, MRN: [id number],    Date: 04/04/2024  Time Spent: 30 mins   start time: 1000   end time 1030  Treatment Type: Individual Therapy  Reported Symptoms: Pt presents for the session via Caregility video; pt grants consent for the session, stating she is in her home with no one else present; pt understands the limits of virtual sessions.  I shared with pt that I am in my office with no one else here either.  Mental Status Exam: Appearance:  Casual     Behavior: Appropriate  Motor: Normal  Speech/Language:  Clear and Coherent  Affect: Appropriate  Mood: depressed  Thought process: normal  Thought content:   WNL  Sensory/Perceptual disturbances:   WNL  Orientation: oriented to person, place, and time/date  Attention: Good  Concentration: Good  Memory: WNL  Fund of knowledge:  Good  Insight:   Good  Judgment:  Good  Impulse Control: Good   Risk Assessment: Danger to Self:  No Self-injurious Behavior: No Danger to Others: No Duty to Warn:no Physical Aggression / Violence:No  Access to Firearms a concern: No  Gang Involvement:No   Subjective: Pt shares that I have been OK since our last session.  I have been trying to find another job and have been applying and interviewing for other positions.  With that comes the rejection of not being selected and I am having to deal with that but that is going well as of now.  I am still doing the canning class at [facility] and I am thoroughly enjoying those classes.  I have joined a new 4-week class in July at church and I am looking forward to that.  I have even scheduled another massage at the place in [place] and was able to get one with my favorite masseuse so I am very excited about that.  I am also going to [place] to visit my father's grave over 4th of July weekend; I am not really looking forward to going but I feel like I have to do that.  Talked pt through  approaching the visit differently and trying to be present while she is there at the [facility] on this visit, using all of her senses to take in the experience of this visit and then using those memories to support her in the future to, possibly, take the place of a future visit.  She is also considering her mom's condition (61 yo) which is not good at this point; her mom is not doing great but she is not in imminent danger at this point.  Pt shares that she is really good at encouraging others in their efforts; she is trying to take the same effort and direct it on herself.  Congratulated pt for these efforts and encouraged her to continue with them.  Encouraged pt to be as intentional as possible about engaging in her self care activities between now and our follow up session in 4 weeks.  Interventions: Cognitive Behavioral Therapy  Diagnosis:Major depressive disorder, recurrent, moderate (HCC)  Plan: Treatment Plan Strengths/Abilities:  Intelligent, Intuitive, Willing to participate in therapy Treatment Preferences:  Outpatient Individual Therapy Statement of Needs:  Patient is to use CBT, mindfulness and coping skills to help manage and/or decrease symptoms associated with their diagnosis. Symptoms:  Depressed/Irritable mood, worry, social withdrawal Problems Addressed:  Depressive thoughts, Sadness, Sleep issues, etc. Long Term Goals:  Pt to reduce overall level, frequency, and intensity of the  feelings of depression as evidenced by decreased irritability, negative self talk, and helpless feelings from 6 to 7 days/week to 0 to 1 days/week, per client report, for at least 3 consecutive months.  Progress: 10% Short Term Goals:  Pt to verbally express understanding of the relationship between feelings of depression and their impact on thinking patterns and behaviors.  Pt to verbalize an understanding of the role that distorted thinking plays in creating fears, excessive worry, and ruminations.   Progress: 10% Target Date:  08/31/2024 Frequency:  Bi-weekly Modality:  Cognitive Behavioral Therapy Interventions by Therapist:  Therapist will use CBT, Mindfulness exercises, Coping skills and Referrals, as needed by client. Client has verbally approved this treatment plan.  Jhonny Moss, [facility]

## 2024-04-07 ENCOUNTER — Ambulatory Visit (INDEPENDENT_AMBULATORY_CARE_PROVIDER_SITE_OTHER): Admitting: Internal Medicine

## 2024-04-07 VITALS — BP 119/74 | HR 68 | Resp 16 | Ht 64.0 in | Wt 215.0 lb

## 2024-04-07 DIAGNOSIS — G4733 Obstructive sleep apnea (adult) (pediatric): Secondary | ICD-10-CM

## 2024-04-07 DIAGNOSIS — Z7189 Other specified counseling: Secondary | ICD-10-CM

## 2024-04-07 DIAGNOSIS — G2581 Restless legs syndrome: Secondary | ICD-10-CM | POA: Diagnosis not present

## 2024-04-07 NOTE — Patient Instructions (Signed)

## 2024-04-09 NOTE — Progress Notes (Signed)
 [facility] [address] [place], [place] [address]  Pulmonary Sleep Medicine   Office Visit Note  Patient Name: Kara Porter DOB: 19-May-1961 MRN [id number]  Date of Service: 04/09/2024  Complaints/HPI:   Office Spirometry Results:     ROS  General: (-) fever, (-) chills, (-) night sweats, (-) weakness Skin: (-) rashes, (-) itching,. Eyes: (-) visual changes, (-) redness, (-) itching. Nose and Sinuses: (-) nasal stuffiness or itchiness, (-) postnasal drip, (-) nosebleeds, (-) sinus trouble. Mouth and Throat: (-) sore throat, (-) hoarseness. Neck: (-) swollen glands, (-) enlarged thyroid , (-) neck pain. Respiratory:  cough, (-) bloody sputum, shortness of breath,  wheezing. Cardiovascular:  ankle swelling, (-) chest pain. Lymphatic: (-) lymph node enlargement. Neurologic: (-) numbness, (-) tingling. Psychiatric: (-) anxiety, (-) depression   Current Medication: Outpatient Encounter Medications as of 04/07/2024  Medication Sig   albuterol  (VENTOLIN  HFA) 108 (90 Base) MCG/ACT inhaler TAKE 2 PUFFS BY MOUTH EVERY 6 HOURS AS NEEDED FOR WHEEZE OR SHORTNESS OF BREATH   Barberry-Oreg Grape-Goldenseal (BERBERINE COMPLEX PO) Take 2 mg by mouth daily.   busPIRone  (BUSPAR ) 5 MG tablet TAKE 1 TABLET (5 MG TOTAL) BY MOUTH 3 (THREE) TIMES DAILY AS NEEDED (ANXIETY).   cholecalciferol (VITAMIN D3) 25 MCG (1000 UNIT) tablet Take 1,000 Units by mouth daily.   clotrimazole -betamethasone  (LOTRISONE ) cream Apply 1-2 times a day for worsening flare intertrigo, may re-use daily up to 1 week as needed.   Fiber Adult Gummies 2 g CHEW    hydrochlorothiazide  (HYDRODIURIL ) 25 MG tablet Take 1 tablet (25 mg total) by mouth daily.   ipratropium (ATROVENT ) 0.06 % nasal spray Place 2 sprays into both nostrils 4 (four) times daily. For up to 5-7 days then stop.   meloxicam  (MOBIC ) 15 MG tablet TAKE 1 TABLET (15 MG TOTAL) BY MOUTH AS NEEDED.   montelukast  (SINGULAIR ) 10 MG tablet Take 1 tablet  (10 mg total) by mouth at bedtime.   MOUNJARO  5 MG/0.5ML Pen Inject 5 mg into the skin once a week.   Multiple Vitamin (MULTIVITAMIN) LIQD Take 5 mLs by mouth daily.   mupirocin  ointment (BACTROBAN ) 2 % Apply 1 Application topically 2 (two) times daily. For 2 weeks (Patient not taking: Reported on 11/06/2023)   NON FORMULARY REDICALM (Patient not taking: Reported on 11/06/2023)   nystatin  (MYCOSTATIN /NYSTOP ) powder Apply 1 Application topically 3 (three) times daily as needed (candidal rash). For 1-2 weeks or until resolved (Patient not taking: Reported on 11/06/2023)   Turmeric (QC TUMERIC COMPLEX PO) Take 1,000 mg by mouth.   valACYclovir  (VALTREX ) 500 MG tablet TAKE 1 TABLET (500 MG TOTAL) BY MOUTH 3 (THREE) TIMES DAILY AS NEEDED FOR FLARE   No facility-administered encounter medications on file as of 04/07/2024.    Surgical History: Past Surgical History:  Procedure Laterality Date   CESAREAN SECTION  1991 and 1997   GALLBLADDER SURGERY  1993   HYSTEROSCOPY  2018    Medical History: Past Medical History:  Diagnosis Date   Irritable bowel syndrome 2019    Family History: Family History  Problem Relation Age of Onset   Hyperlipidemia Mother    Heart disease Mother    Cancer Father    Hyperlipidemia Father    Breast cancer Neg Hx    Adrenal disorder Neg Hx     Social History: Social History   Socioeconomic History   Marital status: Married    Spouse name: Not on file   Number of children: 2   Years  of education: College   Highest education level: Bachelor's degree (e.g., BA, AB, BS)  Occupational History   Occupation: HR Manager    Comment: [employer]  Tobacco Use   Smoking status: Former    Current packs/day: 0.00    Average packs/day: 0.8 packs/day for 6.0 years (4.5 ttl pk-yrs)    Types: Cigarettes    Start date: 2012    Quit date: 2018    Years since quitting: 7.4   Smokeless tobacco: Former  Building services engineer status: Never Used  Substance and Sexual  Activity   Alcohol use: Not Currently   Drug use: Never   Sexual activity: Not Currently    Birth control/protection: None  Other Topics Concern   Not on file  Social History Narrative   Not on file   Social Drivers of Health   Financial Resource Strain: Low Risk  (11/05/2023)   Overall Financial Resource Strain (CARDIA)    Difficulty of Paying Living Expenses: Not very hard  Food Insecurity: No Food Insecurity (11/05/2023)   Hunger Vital Sign    Worried About Running Out of Food in the Last Year: Never true    Ran Out of Food in the Last Year: Never true  Transportation Needs: No Transportation Needs (11/05/2023)   PRAPARE - Administrator, Civil Service (Medical): No    Lack of Transportation (Non-Medical): No  Physical Activity: Insufficiently Active (11/05/2023)   Exercise Vital Sign    Days of Exercise per Week: 2 days    Minutes of Exercise per Session: 30 min  Stress: No Stress Concern Present (11/05/2023)   [employer] of Occupational Health - Occupational Stress Questionnaire    Feeling of Stress : Only a little  Social Connections: Moderately Integrated (11/05/2023)   Social Connection and Isolation Panel    Frequency of Communication with Friends and Family: More than three times a week    Frequency of Social Gatherings with Friends and Family: Once a week    Attends Religious Services: More than 4 times per year    Active Member of [employer] or Organizations: No    Attends Banker Meetings: Not on file    Marital Status: Married  Intimate Partner Violence: Unknown (01/20/2022)   Received from [facility]   HITS    Physically Hurt: Not on file    Insult or Talk Down To: Not on file    Threaten Physical Harm: Not on file    Scream or Curse: Not on file    Vital Signs: Blood pressure 119/74, pulse 68, resp. rate 16, height 5' 4 (1.626 m), weight 215 lb (97.5 kg), last menstrual period 11/24/2017, SpO2 98%.  Examination: General  Appearance: The patient is well-developed, well-nourished, and in no distress. Skin: Gross inspection of skin unremarkable. Head: normocephalic, no gross deformities. Eyes: no gross deformities noted. ENT: ears appear grossly normal no exudates. Neck: Supple. No thyromegaly. No LAD. Respiratory: . Cardiovascular: Normal S1 and S2 without murmur or rub. Extremities: No cyanosis. pulses are equal. Neurologic: Alert and oriented. No involuntary movements.  LABS: No results found for this or any previous visit (from the past 2160 hours).  Radiology: CT CARDIAC SCORING (SELF PAY ONLY) Addendum Date: 12/01/2023 ADDENDUM REPORT: 12/01/2023 22:31 EXAM: OVER-READ INTERPRETATION CT CHEST The following report is an over-read performed by radiologist Dr. Norman Hopper of [facility] on 12/01/2023. This over-read does not include interpretation of cardiac or coronary anatomy or pathology. The coronary calcium score  interpretation by the cardiologist is attached. COMPARISON:  None. FINDINGS: Cardiovascular: No significant extracardiac vascular findings. Normal heart size. No pericardial effusion. Mediastinum/Nodes: No enlarged mediastinal lymph nodes. The visible trachea and esophagus demonstrate no significant findings. Lungs/Pleura: The visible lungs are clear. No pleural effusion. Upper Abdomen: No acute abnormality. Musculoskeletal: No chest wall mass or suspicious bone lesions identified. IMPRESSION: No significant extracardiac findings. Electronically Signed   By: Norman Hopper M.D.   On: 12/01/2023 22:31   Result Date: 12/01/2023 CLINICAL DATA:  Risk stratification EXAM: Coronary Calcium Score TECHNIQUE: The patient was scanned on a Siemens Somatom scanner. Axial non-contrast 3 mm slices were carried out through the heart. The data set was analyzed on a dedicated work station and scored using the Agatson method. FINDINGS: Non-cardiac: See separate report from [facility]. Ascending  Aorta: Normal size Pericardium: Normal Coronary arteries: Normal origin of left and right coronary arteries. Distribution of arterial calcifications if present, as noted below; LM 0 LAD 0 LCx 0 RCA 0 Total 0 IMPRESSION AND RECOMMENDATION: 1. Coronary calcium score of 0. 2. CAC 0, CAC-DRS A0. 3. Continue heart healthy lifestyle and risk factor modification. Electronically Signed: By: Redell Cave M.D. On: 11/22/2023 15:05    No results found.  No results found.  Assessment and Plan: Patient Active Problem List   Diagnosis Date Noted   CPAP use counseling 10/08/2023   Restless leg syndrome 10/08/2023   Major depressive disorder, recurrent, moderate (HCC) 08/18/2022   Generalized anxiety disorder with panic attacks 08/18/2022   Psychophysiological insomnia 08/18/2022   Excessive daytime sleepiness 02/15/2021   OSA (obstructive sleep apnea) 02/15/2021   Weight gain 01/24/2021   Hyperglycemia 01/24/2021   Spinal stenosis of lumbar region without neurogenic claudication 08/06/2019   Thrombocytosis 08/28/2018   Type 2 diabetes mellitus with other specified complication (HCC) 08/25/2018   Hypercholesteremia 07/25/2018   Seborrheic keratoses 07/24/2018   Morbid obesity (HCC) 07/24/2018   Fluid retention 07/24/2018   Irritable bowel syndrome 10/16/2017      General Counseling: I have discussed the findings of the evaluation and examination with Donzell.  I have also discussed any further diagnostic evaluation thatmay be needed or ordered today. Lynell verbalizes understanding of the findings of todays visit. We also reviewed her medications today and discussed drug interactions and side effects including but not limited excessive drowsiness and altered mental states. We also discussed that there is always a risk not just to her but also people around her. she has been encouraged to call the office with any questions or concerns that should arise related to todays visit.  No orders of the  defined types were placed in this encounter.    Time spent:   I have personally obtained a history, examined the patient, evaluated laboratory and imaging results, formulated the assessment and plan and placed orders.    Elfreda DELENA Bathe, MD [facility] Pulmonary and Critical Care Sleep medicine

## 2024-05-02 ENCOUNTER — Ambulatory Visit (INDEPENDENT_AMBULATORY_CARE_PROVIDER_SITE_OTHER): Payer: Self-pay | Admitting: Psychology

## 2024-05-02 DIAGNOSIS — F331 Major depressive disorder, recurrent, moderate: Secondary | ICD-10-CM

## 2024-05-02 NOTE — Progress Notes (Signed)
 [facility] Counselor/Therapist Progress Note  Patient ID: Kara Porter, MRN: [id number],    Date: 05/02/2024  Time Spent: 55 mins   start time: 1000   end time 1055  Treatment Type: Individual Therapy  Reported Symptoms: Pt presents for the session via Caregility video; pt grants consent for the session, stating she is in her home with no one else present; pt understands the limits of virtual sessions.  I shared with pt that I am in my office with no one else here either.  Mental Status Exam: Appearance:  Casual     Behavior: Appropriate  Motor: Normal  Speech/Language:  Clear and Coherent  Affect: Appropriate  Mood: depressed  Thought process: normal  Thought content:   WNL  Sensory/Perceptual disturbances:   WNL  Orientation: oriented to person, place, and time/date  Attention: Good  Concentration: Good  Memory: WNL  Fund of knowledge:  Good  Insight:   Good  Judgment:  Good  Impulse Control: Good   Risk Assessment: Danger to Self:  No Self-injurious Behavior: No Danger to Others: No Duty to Warn:no Physical Aggression / Violence:No  Access to Firearms a concern: No  Gang Involvement:No   Subjective: Pt shares that I have been OK since our last session.  I did go to [place] to visit my father's grave and that was very mechanical for me.  I was able to finalize his grand daughter getting the utilities in her name and out of my father's name.  I have joined another life group at church and it is only 4 sessions and started this past Monday.  I enjoyed the group.  Pt shares that there was a girl in her last group that was in this one as well and pt went out of her way to meet her again and pt felt good about that interaction; this was her effort to continue building community for herself.  Pt continues to go to her canning class at [facility] and she is enjoying that; both her life group and canning class will both be going each week in July.  Pt shares that she was  talking with her sister last night and some things came up that she had not planned to mention.  She told her sister that she has felt lighter since my dad and step mom passed away (dad-a year and a half ago).  Both parents were very cruel to pt and her sister by telling them they would never become anything in their lives and they were useless.  Pt shares that she now feels less impacted by the perspectives of others and is able to focus only on her own perspective of herself.  Pt is continuing to look for another job because her boss is not a good person for her to be working with.  Pt shares that it is unusual for her to be able to focus on herself and what her needs; she acknowledges the benefits of counseling in her life in the time that we have worked together.  Pt shares that she did have her massage and she really was able to enjoy it more this time and that was great for her.  She is planning to schedule another one soon.  Pt shares that her mom was hospitalized again and had to have her heart rhythm corrected.  Her mom is doing OK but she seems to be frail.; her mom and step dad live in [place].  Encouraged pt to be as intentional as  possible about engaging in her self care activities between now and our follow up session in 4 weeks.  Interventions: Cognitive Behavioral Therapy  Diagnosis:Major depressive disorder, recurrent, moderate (HCC)  Plan: Treatment Plan Strengths/Abilities:  Intelligent, Intuitive, Willing to participate in therapy Treatment Preferences:  Outpatient Individual Therapy Statement of Needs:  Patient is to use CBT, mindfulness and coping skills to help manage and/or decrease symptoms associated with their diagnosis. Symptoms:  Depressed/Irritable mood, worry, social withdrawal Problems Addressed:  Depressive thoughts, Sadness, Sleep issues, etc. Long Term Goals:  Pt to reduce overall level, frequency, and intensity of the feelings of depression as evidenced by decreased  irritability, negative self talk, and helpless feelings from 6 to 7 days/week to 0 to 1 days/week, per client report, for at least 3 consecutive months.  Progress: 10% Short Term Goals:  Pt to verbally express understanding of the relationship between feelings of depression and their impact on thinking patterns and behaviors.  Pt to verbalize an understanding of the role that distorted thinking plays in creating fears, excessive worry, and ruminations.  Progress: 10% Target Date:  08/31/2024 Frequency:  Bi-weekly Modality:  Cognitive Behavioral Therapy Interventions by Therapist:  Therapist will use CBT, Mindfulness exercises, Coping skills and Referrals, as needed by client. Client has verbally approved this treatment plan.  [name], [facility]

## 2024-05-06 ENCOUNTER — Encounter: Payer: Self-pay | Admitting: Family Medicine

## 2024-05-06 ENCOUNTER — Ambulatory Visit: Payer: Self-pay | Admitting: Family Medicine

## 2024-05-06 ENCOUNTER — Other Ambulatory Visit: Payer: Self-pay | Admitting: Family Medicine

## 2024-05-06 VITALS — BP 120/68 | HR 77 | Ht 64.0 in | Wt 213.1 lb

## 2024-05-06 DIAGNOSIS — E7800 Pure hypercholesterolemia, unspecified: Secondary | ICD-10-CM

## 2024-05-06 DIAGNOSIS — Z0000 Encounter for general adult medical examination without abnormal findings: Secondary | ICD-10-CM

## 2024-05-06 DIAGNOSIS — M1612 Unilateral primary osteoarthritis, left hip: Secondary | ICD-10-CM

## 2024-05-06 DIAGNOSIS — D2271 Melanocytic nevi of right lower limb, including hip: Secondary | ICD-10-CM | POA: Diagnosis not present

## 2024-05-06 DIAGNOSIS — M51362 Other intervertebral disc degeneration, lumbar region with discogenic back pain and lower extremity pain: Secondary | ICD-10-CM

## 2024-05-06 DIAGNOSIS — E1169 Type 2 diabetes mellitus with other specified complication: Secondary | ICD-10-CM | POA: Diagnosis not present

## 2024-05-06 DIAGNOSIS — Z7985 Long-term (current) use of injectable non-insulin antidiabetic drugs: Secondary | ICD-10-CM

## 2024-05-06 DIAGNOSIS — M5442 Lumbago with sciatica, left side: Secondary | ICD-10-CM

## 2024-05-06 DIAGNOSIS — G8929 Other chronic pain: Secondary | ICD-10-CM

## 2024-05-06 DIAGNOSIS — Z1283 Encounter for screening for malignant neoplasm of skin: Secondary | ICD-10-CM

## 2024-05-06 DIAGNOSIS — M25552 Pain in left hip: Secondary | ICD-10-CM

## 2024-05-06 DIAGNOSIS — E559 Vitamin D deficiency, unspecified: Secondary | ICD-10-CM

## 2024-05-06 LAB — POCT GLYCOSYLATED HEMOGLOBIN (HGB A1C): Hemoglobin A1C: 5.4 % (ref 4.0–5.6)

## 2024-05-06 MED ORDER — MOUNJARO 5 MG/0.5ML ~~LOC~~ SOAJ: 5.0000 mg | SUBCUTANEOUS | 5 refills | Status: DC

## 2024-05-06 NOTE — Patient Instructions (Addendum)
 Thank you for coming to the office today.  Referral to [facility].  ----------------  Referral to [facility] for Back vs Left Hip  [address] ([address] 2) [place], [place] [address]  Contact Phone: [phone]  Recent Labs    06/27/23 1003 10/29/23 0816 05/06/24 0911  HGBA1C 5.6 5.8* 5.4     Please schedule a Follow-up Appointment to: Return for 6 month fasting lab > 1 week later Annual Physical.  If you have any other questions or concerns, please feel free to call the office or send a message through MyChart. You may also schedule an earlier appointment if necessary.  Additionally, you may be receiving a survey about your experience at our office within a few days to 1 week by e-mail or mail. We value your feedback.  Marsa Officer, DO [facility], [place]

## 2024-05-06 NOTE — Progress Notes (Signed)
 Subjective:    Patient ID: Kara Porter, female    DOB: 1960-11-08, 63 y.o.   MRN: [id number]  Kara Porter is a 63 y.o. female presenting on 05/06/2024 for Medical Management of Chronic Issues   HPI  Discussed the use of AI scribe software for clinical note transcription with the patient, who gave verbal consent to proceed.  History of Present Illness   Kara Porter is a 64 year old female who presents for follow-up on back pain and dermatology referral.  Chronic back pain and lower extremity symptoms - Ongoing back pain significantly impairs quality of life - Pain worsens after walking, resulting in severe limitation ('done in the water') - Known herniated disc; prior spinal injections at [facility] with unsatisfactory results back in 2020 - Previous treatments include chiropractic care, acupuncture, and melatonin for sleep, with partial relief - Uses magnesium at night for restless leg symptoms - MRI of the spine performed in 2020 - No interest in disc surgery; considering further evaluation for pain source  L Hip Pain, chronic - Left hip x-ray performed in 2017 - Interested in further evaluation to determine pain etiology if this could be related to her pain instead of low back  Atypical Nevus / Cutaneous lesions and dermatology follow-up - History of precancerous skin lesions identified on previous biopsy - Seeking new dermatologist due to dissatisfaction with prior provider in [place] - Desires follow-up for ongoing dermatologic care  Type 2 Diabetes Type 2 Diabetes / Morbid Obesity BMI >36 Controlled DM Meds:  Mounjaro  5mg  weekly inj, prefers the low dose, since she is improving her lifestyle mostly OFF Metformin  - Off Ozempic /Wegovy  previously - Monitoring blood glucose with improvement in A1c from 5.8 to 5.4 - Currently using Mounjaro  for appetite control - Focused on further weight loss with a goal of losing an additional ten pounds - Intends to adjust  portion sizes to support weight loss      Sleep apnea provider / Pulmonology suggested Magnesium at night to help manage symptoms    OSA on CPAP She received her CPAP machine, setting 10 pressure. She has light weight mask that works well and she feels good with it. Recently started on CPAP - Today reports that sleep apnea is well controlled. She uses the CPAP machine every night. Tolerates the machine well, and thinks that sleeps better with it and feels good. No new concerns or symptoms.     Health Maintenance:   Due for next Mammogram anytime, last was 09/2022. Negative. Order is in today.   Shingrix  due in future.   Cologuard 12/2022, negative, next due 12/2025.   Next Cervical CA Screen pap smear 2027.       05/06/2024    8:47 AM 11/06/2023    8:20 AM 06/27/2023    9:41 AM  Depression screen PHQ 2/9  Decreased Interest 0 1 1  Down, Depressed, Hopeless 0 1 0  PHQ - 2 Score 0 2 1  Altered sleeping 1 1 0  Tired, decreased energy 1 1 1   Change in appetite 1 0 0  Feeling bad or failure about yourself  0 1 0  Trouble concentrating 1 1 0  Moving slowly or fidgety/restless 0 0 0  Suicidal thoughts 0 0 0  PHQ-9 Score 4 6 2   Difficult doing work/chores Not difficult at all  Not difficult at all       05/06/2024    8:47 AM 11/06/2023    8:20 AM 06/27/2023  9:41 AM 11/07/2022    8:06 AM  GAD 7 : Generalized Anxiety Score  Nervous, Anxious, on Edge 1 1 1 1   Control/stop worrying 1 0 0 1  Worry too much - different things 1 1 1 1   Trouble relaxing 1 1 1 1   Restless 0 0 0 0  Easily annoyed or irritable 1 1 1 2   Afraid - awful might happen 0 0 0 1  Total GAD 7 Score 5 4 4 7   Anxiety Difficulty Not difficult at all Not difficult at all  Somewhat difficult    Social History   Tobacco Use   Smoking status: Former    Current packs/day: 0.00    Average packs/day: 0.8 packs/day for 6.0 years (4.5 ttl pk-yrs)    Types: Cigarettes    Start date: 2012    Quit date: 2018     Years since quitting: 7.5   Smokeless tobacco: Former  Building services engineer status: Never Used  Substance Use Topics   Alcohol use: Not Currently   Drug use: Never    Review of Systems Per HPI unless specifically indicated above     Objective:    BP 120/68 (BP Location: Left Arm, Patient Position: Sitting, Cuff Size: Large)   Pulse 77   Ht 5' 4 (1.626 m)   Wt 213 lb 2 oz (96.7 kg)   LMP 11/24/2017 (Exact Date)   SpO2 95%   BMI 36.58 kg/m   Wt Readings from Last 3 Encounters:  05/06/24 213 lb 2 oz (96.7 kg)  04/07/24 215 lb (97.5 kg)  11/06/23 214 lb (97.1 kg)    Physical Exam Vitals and nursing note reviewed.  Constitutional:      General: She is not in acute distress.    Appearance: She is well-developed. She is obese. She is not diaphoretic.     Comments: Well-appearing, comfortable, cooperative  HENT:     Head: Normocephalic and atraumatic.  Eyes:     General:        Right eye: No discharge.        Left eye: No discharge.     Conjunctiva/sclera: Conjunctivae normal.  Neck:     Thyroid : No thyromegaly.  Cardiovascular:     Rate and Rhythm: Normal rate and regular rhythm.     Heart sounds: Normal heart sounds. No murmur heard. Pulmonary:     Effort: Pulmonary effort is normal. No respiratory distress.     Breath sounds: Normal breath sounds. No wheezing or rales.  Musculoskeletal:        General: Normal range of motion.     Cervical back: Normal range of motion and neck supple.  Lymphadenopathy:     Cervical: No cervical adenopathy.  Skin:    General: Skin is warm and dry.     Findings: No erythema or rash.  Neurological:     Mental Status: She is alert and oriented to person, place, and time.  Psychiatric:        Behavior: Behavior normal.     Comments: Well groomed, good eye contact, normal speech and thoughts     I have personally reviewed the radiology report from 07/28/19 on MRI Lumbar Spine.  MRI Spine Lumbar WO IV Contrast  Anatomical  Region Laterality Modality  T-spine -- Magnetic Resonance  L-spine -- --  Pelvis -- --   Impression  IMPRESSION: 1.  Moderate degenerative disc disease at L5-S1 with mild subarticular stenosis on the left and moderate foraminal stenosis  on the left. 2.  Mild spinal stenosis L4-5.  Electronically Signed by: Debby Chess Narrative  MRI lumbar spine:  INDICATION: Low back pain with numbness and tingling extending into the left leg.  TECHNIQUE: Sagittal and axial T1 and T2-weighted sequences were performed. Additional sagittal STIR images were performed.  COMPARISON: None available  FINDINGS: #  Vertebral bodies: No compression fracture. #  Alignment: Normal. #  Marrow signal: No significant abnormality. #  Conus medullaris: Normal. Terminates at T12-L1 with no evidence of tethering. #  Lower thoracic segments: No significant abnormality.  #  L1-2: Unremarkable #  L2-3: Mild degenerative disc disease. Mild facet joint arthritis. #  L3-4: Mild degenerative disc disease. Mild facet joint arthritis. There is some thickening of the ligamentum flavum. Very small synovial cyst underneath the right ligamentum flavum. No significant spinal or foraminal stenosis. #  L4-5: Mild degenerative disc disease. Moderate facet joint arthritis. Mild thickening of the ligamentum flavum. Mild spinal stenosis. #  L5-S1: Moderate degenerative disc disease. Mild retrolisthesis of L5 on S1. Mild subarticular stenosis on the left. Moderate foraminal stenosis on the left. Procedure Note  Chess Debby NOVAK, MD - 07/29/2019 Formatting of this note might be different from the original. MRI lumbar spine:  INDICATION: Low back pain with numbness and tingling extending into the left leg.  TECHNIQUE: Sagittal and axial T1 and T2-weighted sequences were performed. Additional sagittal STIR images were performed.  COMPARISON: None available  FINDINGS: #  Vertebral bodies: No compression fracture. #   Alignment: Normal. #  Marrow signal: No significant abnormality. #  Conus medullaris: Normal. Terminates at T12-L1 with no evidence of tethering. #  Lower thoracic segments: No significant abnormality.  #  L1-2: Unremarkable #  L2-3: Mild degenerative disc disease. Mild facet joint arthritis. #  L3-4: Mild degenerative disc disease. Mild facet joint arthritis. There is some thickening of the ligamentum flavum. Very small synovial cyst underneath the right ligamentum flavum. No significant spinal or foraminal stenosis. #  L4-5: Mild degenerative disc disease. Moderate facet joint arthritis. Mild thickening of the ligamentum flavum. Mild spinal stenosis. #  L5-S1: Moderate degenerative disc disease. Mild retrolisthesis of L5 on S1. Mild subarticular stenosis on the left. Moderate foraminal stenosis on the left.   IMPRESSION: 1.  Moderate degenerative disc disease at L5-S1 with mild subarticular stenosis on the left and moderate foraminal stenosis on the left. 2.  Mild spinal stenosis L4-5.  Electronically Signed by: Debby Chess Exam End: 07/28/19 19:34   Specimen Collected: 07/29/19 14:01 Last Resulted: 07/29/19 14:06  Received From: [facility]  Result Received: 12/01/23 22:30   ------------------------------  XR Hip Left  Anatomical Region Laterality Modality  Hip -- Computed Radiography   Impression  IMPRESSION:  Mild degenerative change. Narrative  LEFT HIP 2 VIEWS:  HISTORY: Back pain, left groin pain.      COMPARISON: None  TECHNIQUE: 2 views of the left hip to include pelvis  FINDINGS:  Diffuse osteopenia. Degenerative change at the lower lumbar spine. Normal-appearing SI joints. Mild degenerative change at bilateral hips. No evidence of acute fracture. Procedure Note  Hinn, Cordella Aran, MD - 07/27/2016 Formatting of this note might be different from the original. LEFT HIP 2 VIEWS:  HISTORY: Back pain, left groin pain.      COMPARISON:  None  TECHNIQUE: 2 views of the left hip to include pelvis  FINDINGS:  Diffuse osteopenia. Degenerative change at the lower lumbar spine. Normal-appearing SI joints. Mild degenerative change at bilateral hips. No  evidence of acute fracture.   IMPRESSION:  Mild degenerative change. Exam End: 07/27/16 12:07   Specimen Collected: 07/27/16 12:24 Last Resulted: 07/27/16 12:25  Received From: [facility]  Result Received: 09/05/22 08:52     Results for orders placed or performed in visit on 05/06/24  POCT HgB A1C   Collection Time: 05/06/24  9:11 AM  Result Value Ref Range   Hemoglobin A1C 5.4 4.0 - 5.6 %   HbA1c POC (<> result, manual entry)     HbA1c, POC (prediabetic range)     HbA1c, POC (controlled diabetic range)        Assessment & Plan:   Problem List Items Addressed This Visit     Morbid obesity (HCC)   Relevant Medications   MOUNJARO  5 MG/0.5ML Pen   Type 2 diabetes mellitus with other specified complication (HCC) - Primary   Relevant Medications   MOUNJARO  5 MG/0.5ML Pen   Other Relevant Orders   POCT HgB A1C (Completed)   Other Visit Diagnoses       Atypical nevus of right lower leg       Relevant Orders   Ambulatory referral to Dermatology     Skin cancer screening       Relevant Orders   Ambulatory referral to Dermatology     Degeneration of intervertebral disc of lumbar region with discogenic back pain and lower extremity pain       Relevant Orders   Ambulatory referral to Orthopedic Surgery     Chronic left hip pain       Relevant Orders   Ambulatory referral to Orthopedic Surgery     Primary osteoarthritis of left hip       Relevant Orders   Ambulatory referral to Orthopedic Surgery     Chronic bilateral low back pain with left-sided sciatica       Relevant Orders   Ambulatory referral to Orthopedic Surgery     Long-term current use of injectable noninsulin antidiabetic medication           Low Back Pain / Lumbar Spinal  Stenosis Back Pain with Herniated Disc Chronic Left Hip Pain Chronic back pain with herniated disc confirmed by MRI. Pain affects quality of life, worsens with walking. Considering orthopedic evaluation to differentiate hip and spine issues. Prefers non-surgical spine management. - Refer to [facility] in [place] for evaluation of back and hip issues. - Discuss potential hip replacement if hip issues are confirmed. - Avoid back surgery at this time.  Pre-cancerous Skin Lesion Previous biopsy indicated a precancerous lesion. Seeking second opinion and follow-up. - Refer to [facility] in [place] for follow-up on precancerous skin lesion.  Type 2 Diabetes Mellitus Type 2 diabetes well-controlled with A1c of 5.4. Focused on lifestyle changes and weight management. Mounjaro  effective in appetite control. - Continue Mounjaro  at the current dose. - Encourage continued lifestyle changes and portion control to achieve weight loss goals.        Orders Placed This Encounter  Procedures   Ambulatory referral to Dermatology    Referral Priority:   Routine    Referral Type:   Consultation    Referral Reason:   Specialty Services Required    Requested Specialty:   Dermatology    Number of Visits Requested:   1   Ambulatory referral to Orthopedic Surgery    Referral Priority:   Routine    Referral Type:   Surgical    Referral Reason:   Specialty Services Required  Requested Specialty:   Orthopedic Surgery    Number of Visits Requested:   1   POCT HgB A1C    Meds ordered this encounter  Medications   MOUNJARO  5 MG/0.5ML Pen    Sig: Inject 5 mg into the skin once a week.    Dispense:  2 mL    Refill:  5    Follow up plan: Return for 6 month fasting lab > 1 week later Annual Physical.  Future labs 11/06/24  Marsa Officer, DO [facility] 05/06/2024, 9:05 AM

## 2024-05-30 ENCOUNTER — Ambulatory Visit: Admitting: Psychology

## 2024-05-30 DIAGNOSIS — F331 Major depressive disorder, recurrent, moderate: Secondary | ICD-10-CM

## 2024-05-30 NOTE — Progress Notes (Signed)
 [facility] Counselor/Therapist Progress Note  Patient ID: Vedanshi Massaro, MRN: [id number],    Date: 05/30/2024  Time Spent: 30 mins   start time: 1000   end time 1030  Treatment Type: Individual Therapy  Reported Symptoms: Pt presents for the session via Caregility video; pt grants consent for the session, stating she is in her home with no one else present; pt understands the limits of virtual sessions.  I shared with pt that I am in my office with no one else here either.  Mental Status Exam: Appearance:  Casual     Behavior: Appropriate  Motor: Normal  Speech/Language:  Clear and Coherent  Affect: Appropriate  Mood: depressed  Thought process: normal  Thought content:   WNL  Sensory/Perceptual disturbances:   WNL  Orientation: oriented to person, place, and time/date  Attention: Good  Concentration: Good  Memory: WNL  Fund of knowledge:  Good  Insight:   Good  Judgment:  Good  Impulse Control: Good   Risk Assessment: Danger to Self:  No Self-injurious Behavior: No Danger to Others: No Duty to Warn:no Physical Aggression / Violence:No  Access to Firearms a concern: No  Gang Involvement:No   Subjective: Pt shares that I have been OK since our last session.  I am feeling a little off recently and I am not sure why.  I have been seeking options for treatment for my back pain; I have an MRI scheduled for next Saturday and then the orthopedist will then tell me what his plan is.  I am just not feeling like myself.  Pt would like to take a week off from work but she does not have the time available.  Pt is planning to take two trips coming up soon; I am taking a trip to [place] in early October and Elsie is going with me.  I am also talking all 8 of my family members here to go to [facility] for Thanksgiving to see my mom (61 yo).  Her health is not good and I want to be sure everyone gets a chance to see my mom.  Pt shares they have a Ring doorbell camera and she  was able to see that Elsie was on the front porch and she could hear his conversation on the video and he was again talking to a woman and calling her Jacinta.  Pt is frustrated by this situation but she is not overwhelmed by it.  She has talked with him about it and he got defensive but she would not allow him to gaslight her.  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  She also finished her last [employer] at church and is taking a break right now.  She is also considering options for volunteering at church.  Pt shares that her sister is in [facility] visiting their mom and pt is concerned for her mom's health issues.  Pt's birthday is at the end of the month; she is taking the day off from work and has a massage scheduled for that day.  Encouraged pt to be as intentional as possible about engaging in her self care activities between now and our follow up session in 4 weeks.  Interventions: Cognitive Behavioral Therapy  Diagnosis:Major depressive disorder, recurrent, moderate (HCC)  Plan: Treatment Plan Strengths/Abilities:  Intelligent, Intuitive, Willing to participate in therapy Treatment Preferences:  Outpatient Individual Therapy Statement of Needs:  Patient is to use CBT, mindfulness and coping skills to help manage and/or decrease symptoms associated with their diagnosis. Symptoms:  Depressed/Irritable mood, worry, social withdrawal Problems Addressed:  Depressive thoughts, Sadness, Sleep issues, etc. Long Term Goals:  Pt to reduce overall level, frequency, and intensity of the feelings of depression as evidenced by decreased irritability, negative self talk, and helpless feelings from 6 to 7 days/week to 0 to 1 days/week, per client report, for at least 3 consecutive months.  Progress: 10% Short Term Goals:  Pt to verbally express understanding of the relationship between feelings of depression and their  impact on thinking patterns and behaviors.  Pt to verbalize an understanding of the role that distorted thinking plays in creating fears, excessive worry, and ruminations.  Progress: 10% Target Date:  08/31/2024 Frequency:  Bi-weekly Modality:  Cognitive Behavioral Therapy Interventions by Therapist:  Therapist will use CBT, Mindfulness exercises, Coping skills and Referrals, as needed by client. Client has verbally approved this treatment plan.  [name], [facility]

## 2024-06-27 ENCOUNTER — Ambulatory Visit: Admitting: Psychology

## 2024-07-11 ENCOUNTER — Ambulatory Visit: Admitting: Psychology

## 2024-07-11 DIAGNOSIS — F331 Major depressive disorder, recurrent, moderate: Secondary | ICD-10-CM | POA: Diagnosis not present

## 2024-07-11 NOTE — Progress Notes (Signed)
 [facility] Counselor/Therapist Progress Note  Patient ID: Correna Meacham, MRN: [id number],    Date: 07/11/2024  Time Spent: 60 mins   start time: 1000   end time 1100  Treatment Type: Individual Therapy  Reported Symptoms: Pt presents for the session via Caregility video; pt grants consent for the session, stating she is in her home with no one else present; pt understands the limits of virtual sessions.  I shared with pt that I am in my office with no one else here either.  Mental Status Exam: Appearance:  Casual     Behavior: Appropriate  Motor: Normal  Speech/Language:  Clear and Coherent  Affect: Appropriate  Mood: depressed  Thought process: normal  Thought content:   WNL  Sensory/Perceptual disturbances:   WNL  Orientation: oriented to person, place, and time/date  Attention: Good  Concentration: Good  Memory: WNL  Fund of knowledge:  Good  Insight:   Good  Judgment:  Good  Impulse Control: Good   Risk Assessment: Danger to Self:  No Self-injurious Behavior: No Danger to Others: No Duty to Warn:no Physical Aggression / Violence:No  Access to Firearms a concern: No  Gang Involvement:No   Subjective: Pt shares that I think I may have been backsliding a little bit since our last session.  I have been more anxious and worried about things.  My daughter and I have joined the church we have been visiting and that decision made me so anxious that I did not sleep at all on Sunday night.  I don't want to be judged, I don't know if I can measure up, etc.  Pt shares that she was feeling an imposter syndrome after  formally joining the church.  Pt shares that nothing bad has happened to pt at church since she joined.  Pt shares that she enjoys the church she is attending.  Pt continues to look for another job and that is stressful for her.  Pt shares that she does have herniated discs in her back and the surgeon says that there is nothing that surgery will do for  pt.  The pain has gotten less and that is good for pt.  Pt has a massage scheduled for tomorrow.  Pt and Elsie are going to [place] next week and pt is looking forward to that trip.  She is looking forward to the rest from the trip.  I am also talking all 8 of my family members here to go to [facility] for Thanksgiving to see my mom (49 yo).  Her health is not good and I want to be sure everyone gets a chance to see my mom.  Encouraged pt to be as intentional as possible about engaging in her self care activities between now and our follow up session in 4 weeks.  Interventions: Cognitive Behavioral Therapy  Diagnosis:Major depressive disorder, recurrent, moderate (HCC)  Plan: Treatment Plan Strengths/Abilities:  Intelligent, Intuitive, Willing to participate in therapy Treatment Preferences:  Outpatient Individual Therapy Statement of Needs:  Patient is to use CBT, mindfulness and coping skills to help manage and/or decrease symptoms associated with their diagnosis. Symptoms:  Depressed/Irritable mood, worry, social withdrawal Problems Addressed:  Depressive thoughts, Sadness, Sleep issues, etc. Long Term Goals:  Pt to reduce overall level, frequency, and intensity of the feelings of depression as evidenced by decreased irritability, negative self talk, and helpless feelings from 6 to 7 days/week to 0 to 1 days/week, per client report, for at least 61 consecutive months.  Progress:  10% Short Term Goals:  Pt to verbally express understanding of the relationship between feelings of depression and their impact on thinking patterns and behaviors.  Pt to verbalize an understanding of the role that distorted thinking plays in creating fears, excessive worry, and ruminations.  Progress: 10% Target Date:  08/31/2024 Frequency:  Bi-weekly Modality:  Cognitive Behavioral Therapy Interventions by Therapist:  Therapist will use CBT, Mindfulness exercises, Coping skills and Referrals, as needed by client. Client  has verbally approved this treatment plan.  [name], [facility]

## 2024-08-06 ENCOUNTER — Ambulatory Visit: Payer: Self-pay

## 2024-08-06 NOTE — Telephone Encounter (Signed)
 FYI Only or Action Required?: FYI only for provider.  Patient was last seen in primary care on 05/06/2024 by Edman Marsa PARAS, DO.  Called Nurse Triage reporting Anxiety.  Symptoms began several months ago.  Interventions attempted: Other: Therapy.  Symptoms are: stable.  Triage Disposition: See PCP When Office is Open (Within 3 Days)  Patient/caregiver understands and will follow disposition?: Yes   Copied from CRM #[id number]. Topic: Clinical - Red Word Triage >> Aug 06, 2024  9:29 AM Tobias CROME wrote: Red Word that prompted transfer to Nurse Triage: anxiety and depression has come back Reason for Disposition  MODERATE anxiety (e.g., persistent or frequent anxiety symptoms; interferes with sleep, school, or work)  Answer Assessment - Initial Assessment Questions Offered 24 hour helpline number for [facility]. Patient has an appt upcoming with her therapist Francis Macintosh with [facility] on Oct 31. Patient states the earliest she can see her PCP is on Nov 5 due to work schedule. Appt booked.  1. CONCERN: Did anything happen that prompted you to call today?      Feeling overly anxious and concerned, mind racing, lack of motivation  3. ONSET: How long have you been feeling this way? (e.g., hours, days, weeks)     In August 5. FUNCTIONAL IMPAIRMENT: How have these feelings affected your ability to do daily activities? Have you had more difficulty than usual doing your normal daily activities? (e.g., getting better, same, worse; self-care, school, work, interactions)     Feeling low motivation, no desire to do anything 6. HISTORY: Have you felt this way before? Have you ever been diagnosed with an anxiety problem in the past? (e.g., generalized anxiety disorder, panic attacks, PTSD). If Yes, ask: How was this problem treated? (e.g., medicines, counseling, etc.)     Yes, previously was on medication  7. RISK OF HARM - SUICIDAL IDEATION: Do you ever have  thoughts of hurting or killing yourself? If Yes, ask:  Do you have these feelings now? Do you have a plan on how you would do this?     No 8. TREATMENT:  What has been done so far to treat this anxiety? (e.g., medicines, relaxation strategies). What has helped?     Seeing a therapist  9. THERAPIST: Do you have a counselor or therapist? If Yes, ask: What is their name?     Yes - [facility] Francis Macintosh 11. PATIENT SUPPORT: Who is with you now? Who do you live with? Do you have family or friends who you can talk to?        Yes 12. OTHER SYMPTOMS: Do you have any other symptoms? (e.g., feeling depressed, trouble concentrating, trouble sleeping, trouble breathing, palpitations or fast heartbeat, chest pain, sweating, nausea, or diarrhea)       Lethargy, lack of motivation  Protocols used: Anxiety and Panic Attack-A-AH

## 2024-08-10 ENCOUNTER — Other Ambulatory Visit: Payer: Self-pay | Admitting: Family Medicine

## 2024-08-10 DIAGNOSIS — M48061 Spinal stenosis, lumbar region without neurogenic claudication: Secondary | ICD-10-CM

## 2024-08-12 NOTE — Telephone Encounter (Signed)
 Requested medication (s) are due for refill today - expired Rx  Requested medication (s) are on the active medication list -yes  Future visit scheduled -yes  Last refill: 07/18/23 #30 3RF  Notes to clinic: Manual review required, expired Rx  Requested Prescriptions  Pending Prescriptions Disp Refills   meloxicam  (MOBIC ) 15 MG tablet [Pharmacy Med Name: MELOXICAM  15 MG TABLET] 30 tablet 3    Sig: TAKE 1 TABLET (15 MG TOTAL) BY MOUTH AS NEEDED.     Analgesics:  COX2 Inhibitors Failed - 08/12/2024 11:14 AM      Failed - Manual Review: Labs are only required if the patient has taken medication for more than 8 weeks.      Passed - HGB in normal range and within 360 days    Hemoglobin  Date Value Ref Range Status  10/29/2023 11.9 11.7 - 15.5 g/dL Final         Passed - Cr in normal range and within 360 days    Creat  Date Value Ref Range Status  10/29/2023 0.58 0.50 - 1.05 mg/dL Final   Creatinine, Urine  Date Value Ref Range Status  06/27/2023 281 (H) 20 - 275 mg/dL Final         Passed - HCT in normal range and within 360 days    HCT  Date Value Ref Range Status  10/29/2023 37.7 35.0 - 45.0 % Final         Passed - AST in normal range and within 360 days    AST  Date Value Ref Range Status  10/29/2023 16 10 - 35 U/L Final         Passed - ALT in normal range and within 360 days    ALT  Date Value Ref Range Status  10/29/2023 13 6 - 29 U/L Final         Passed - eGFR is 30 or above and within 360 days    GFR, Est African American  Date Value Ref Range Status  08/18/2020 119 > OR = 60 mL/min/1.22m2 Final   GFR, Est Non African American  Date Value Ref Range Status  08/18/2020 102 > OR = 60 mL/min/1.18m2 Final   eGFR  Date Value Ref Range Status  10/29/2023 104 > OR = 60 mL/min/1.34m2 Final         Passed - Patient is not pregnant      Passed - Valid encounter within last 12 months    Recent Outpatient Visits           3 months ago Type 2 diabetes  mellitus with other specified complication, without long-term current use of insulin (HCC)   [place] [facility] [place], Marsa PARAS, DO       Future Appointments             In 2 months Alm Delon SAILOR, DO [facility]               Requested Prescriptions  Pending Prescriptions Disp Refills   meloxicam  (MOBIC ) 15 MG tablet [Pharmacy Med Name: MELOXICAM  15 MG TABLET] 30 tablet 3    Sig: TAKE 1 TABLET (15 MG TOTAL) BY MOUTH AS NEEDED.     Analgesics:  COX2 Inhibitors Failed - 08/12/2024 11:14 AM      Failed - Manual Review: Labs are only required if the patient has taken medication for more than 8 weeks.      Passed - HGB in normal range and  within 360 days    Hemoglobin  Date Value Ref Range Status  10/29/2023 11.9 11.7 - 15.5 g/dL Final         Passed - Cr in normal range and within 360 days    Creat  Date Value Ref Range Status  10/29/2023 0.58 0.50 - 1.05 mg/dL Final   Creatinine, Urine  Date Value Ref Range Status  06/27/2023 281 (H) 20 - 275 mg/dL Final         Passed - HCT in normal range and within 360 days    HCT  Date Value Ref Range Status  10/29/2023 37.7 35.0 - 45.0 % Final         Passed - AST in normal range and within 360 days    AST  Date Value Ref Range Status  10/29/2023 16 10 - 35 U/L Final         Passed - ALT in normal range and within 360 days    ALT  Date Value Ref Range Status  10/29/2023 13 6 - 29 U/L Final         Passed - eGFR is 30 or above and within 360 days    GFR, Est African American  Date Value Ref Range Status  08/18/2020 119 > OR = 60 mL/min/1.58m2 Final   GFR, Est Non African American  Date Value Ref Range Status  08/18/2020 102 > OR = 60 mL/min/1.33m2 Final   eGFR  Date Value Ref Range Status  10/29/2023 104 > OR = 60 mL/min/1.21m2 Final         Passed - Patient is not pregnant      Passed - Valid encounter within last 12 months    Recent Outpatient Visits            3 months ago Type 2 diabetes mellitus with other specified complication, without long-term current use of insulin [facility])   [place] [facility] [place], Marsa PARAS, DO       Future Appointments             In 2 months Alm Delon SAILOR, DO [facility]

## 2024-08-15 ENCOUNTER — Ambulatory Visit: Admitting: Psychology

## 2024-08-15 DIAGNOSIS — F331 Major depressive disorder, recurrent, moderate: Secondary | ICD-10-CM | POA: Diagnosis not present

## 2024-08-15 NOTE — Progress Notes (Signed)
 [facility] Counselor/Therapist Progress Note  Patient ID: Kara Porter, MRN: [id number],    Date: 08/15/2024  Time Spent: 57 mins   start time: 1000   end time 1057  Treatment Type: Individual Therapy  Reported Symptoms: Pt presents for the session via Caregility video; pt grants consent for the session, stating she is in her home with no one else present; pt understands the limits of virtual sessions.  I shared with pt that I am in my office with no one else here either.  Mental Status Exam: Appearance:  Casual     Behavior: Appropriate  Motor: Normal  Speech/Language:  Clear and Coherent  Affect: Appropriate  Mood: depressed  Thought process: normal  Thought content:   WNL  Sensory/Perceptual disturbances:   WNL  Orientation: oriented to person, place, and time/date  Attention: Good  Concentration: Good  Memory: WNL  Fund of knowledge:  Good  Insight:   Good  Judgment:  Good  Impulse Control: Good   Risk Assessment: Danger to Self:  No Self-injurious Behavior: No Danger to Others: No Duty to Warn:no Physical Aggression / Violence:No  Access to Firearms a concern: No  Gang Involvement:No   Subjective: Pt shares that I have not been great since our last session.  I have made an appt with my PCP for next week because I feel like the anxiety and depression are coming back on me more regularly.  Work is more stressful recently but that may be because of my mood.  I believe that work is a toxic environment and that is not good.  I still want to go to [facility] [place] with my extended family (sister and daughters) so everyone can see my mom (69 yo) because I believe that my mom is not doing well physically and to create this family event for mom is the best gift we can give my mom.  Pt shares that her mom is white [employer]) and she has remarried a white man a number of years ago and her husband has always been good for pt's mom.  Pt shares that pt's  sister is out of work and her husband is a manufacturing engineer and has been furloughed now for more than a month and it is hard on them financially.  Pt finds herself trying to help everyone in her family (mom, sister, daughters, and herself) at the same time.  She is aware that this is happening and she understands how this is effecting her.  Pt is hopeful that her PCP will be able to help her with medication.  She is sure that her job is hard on right now; she is still applying for other jobs but nothing has hit for her yet.  Pt is having experience being hard on herself again recently.  She has volunteered with the church a couple of things lately but she has fallen off of my self care activities lately and I know that is not good for me.  Pt wants to schedule another massage for her soon as well.  Pt shares that she and Elsie went to [place] about 4 wks ago and they had a great time; I am so glad to have had that trip.        Encouraged pt to be as intentional as possible about engaging in her self care activities between now and our follow up session in 4 weeks.  Interventions: Cognitive Behavioral Therapy  Diagnosis:Major depressive disorder, recurrent, moderate (HCC)  Plan:  Treatment Plan Strengths/Abilities:  Intelligent, Intuitive, Willing to participate in therapy Treatment Preferences:  Outpatient Individual Therapy Statement of Needs:  Patient is to use CBT, mindfulness and coping skills to help manage and/or decrease symptoms associated with their diagnosis. Symptoms:  Depressed/Irritable mood, worry, social withdrawal Problems Addressed:  Depressive thoughts, Sadness, Sleep issues, etc. Long Term Goals:  Pt to reduce overall level, frequency, and intensity of the feelings of depression as evidenced by decreased irritability, negative self talk, and helpless feelings from 6 to 7 days/week to 0 to 1 days/week, per client report, for at least 3 consecutive months.  Progress:  10% Short Term Goals:  Pt to verbally express understanding of the relationship between feelings of depression and their impact on thinking patterns and behaviors.  Pt to verbalize an understanding of the role that distorted thinking plays in creating fears, excessive worry, and ruminations.  Progress: 10% Target Date:  08/31/2024 Frequency:  Bi-weekly Modality:  Cognitive Behavioral Therapy Interventions by Therapist:  Therapist will use CBT, Mindfulness exercises, Coping skills and Referrals, as needed by client. Client has verbally approved this treatment plan.  [name], [facility]

## 2024-08-20 ENCOUNTER — Ambulatory Visit: Admitting: Family Medicine

## 2024-08-20 ENCOUNTER — Encounter: Payer: Self-pay | Admitting: Family Medicine

## 2024-08-20 VITALS — BP 124/76 | HR 72 | Ht 64.0 in | Wt 216.2 lb

## 2024-08-20 DIAGNOSIS — F331 Major depressive disorder, recurrent, moderate: Secondary | ICD-10-CM

## 2024-08-20 DIAGNOSIS — F410 Panic disorder [episodic paroxysmal anxiety] without agoraphobia: Secondary | ICD-10-CM

## 2024-08-20 DIAGNOSIS — F5104 Psychophysiologic insomnia: Secondary | ICD-10-CM | POA: Diagnosis not present

## 2024-08-20 DIAGNOSIS — F411 Generalized anxiety disorder: Secondary | ICD-10-CM | POA: Diagnosis not present

## 2024-08-20 MED ORDER — BUSPIRONE HCL 5 MG PO TABS: 5.0000 mg | ORAL_TABLET | Freq: Two times a day (BID) | ORAL | 2 refills | Status: DC | PRN

## 2024-08-20 MED ORDER — TRAZODONE HCL 50 MG PO TABS: 25.0000 mg | ORAL_TABLET | Freq: Every evening | ORAL | 2 refills | Status: DC | PRN

## 2024-08-20 MED ORDER — ESCITALOPRAM OXALATE 5 MG PO TABS: 5.0000 mg | ORAL_TABLET | Freq: Every day | ORAL | 2 refills | Status: DC

## 2024-08-20 NOTE — Progress Notes (Unsigned)
 Subjective:    Patient ID: Kara Porter, female    DOB: 03-Aug-1961, 63 y.o.   MRN: [id number]  Kara Porter is a 63 y.o. female presenting on 08/20/2024 for Anxiety and Depression   HPI  Discussed the use of AI scribe software for clinical note transcription with the patient, who gave verbal consent to proceed.  History of Present Illness   Kara Porter is a 63 year old female with anxiety and depression who presents with worsening mental health symptoms.  Generalized Anxiety Disorder w/ Panic Attacks Major Depression Recurrent, Moderate  2 years ago initial evaluation, 08/18/22 for Anxiety, at that time triggered by care-giving for her sick father who eventually passed. Previously 2023 she was treated with Rx medication Lexapro  + referral Psychology therapy + FMLA leave  Current stressors with her job and outside stressors in the world triggers, feels overwhelmed, also stress with her mother is ill and they are working to provide care for her in near future.  Not endorsing seasonal affective component  - Worsening anxiety and demotivation over recent weeks - Excessive worry about inconsequential matters - Lack of energy and desire to engage in previously enjoyable activities - Decreased motivation to participate in therapy sessions - History of seasonal affective disorder, with typical symptom onset in February and March - Short-tempered and irritable at work, with decreased ability to manage stress  Sleep disturbance - Difficulty maintaining sleep despite use of CPAP machine - Persistent sleep disruption contributing to fatigue and decreased focus - Decreased ability to focus at work - Impaired concentration associated with mood and sleep disturbances  Psychiatric treatment history and medication use - Consistent engagement in monthly therapy sessions for the past two years with benefit - Has come off of rx medications, she prefers to avoid rx long term - Current use of  herbal supplement Ready Calm, containing 5-HTP, L-theanine, and lemon balm - Previous trials of Lexapro , Buspar , and trazodone , discontinued due to concerns about side effects and dependency - Preference for minimal pharmaceutical intervention         08/20/2024    8:29 AM 05/06/2024    8:47 AM 11/06/2023    8:20 AM  Depression screen PHQ 2/9  Decreased Interest 2 0 1  Down, Depressed, Hopeless 2 0 1  PHQ - 2 Score 4 0 2  Altered sleeping 2 1 1   Tired, decreased energy 2 1 1   Change in appetite 2 1 0  Feeling bad or failure about yourself  2 0 1  Trouble concentrating 3 1 1   Moving slowly or fidgety/restless 1 0 0  Suicidal thoughts 0 0 0  PHQ-9 Score 16 4 6   Difficult doing work/chores Somewhat difficult Not difficult at all        08/20/2024    8:30 AM 05/06/2024    8:47 AM 11/06/2023    8:20 AM 06/27/2023    9:41 AM  GAD 7 : Generalized Anxiety Score  Nervous, Anxious, on Edge 3 1 1 1   Control/stop worrying 3 1 0 0  Worry too much - different things 3 1 1 1   Trouble relaxing 3 1 1 1   Restless 2 0 0 0  Easily annoyed or irritable 2 1 1 1   Afraid - awful might happen 3 0 0 0  Total GAD 7 Score 19 5 4 4   Anxiety Difficulty Very difficult Not difficult at all Not difficult at all     Social History   Tobacco Use   Smoking status: Former  Current packs/day: 0.00    Average packs/day: 0.8 packs/day for 6.0 years (4.5 ttl pk-yrs)    Types: Cigarettes    Start date: 2012    Quit date: 2018    Years since quitting: 7.8   Smokeless tobacco: Former  Building Services Engineer status: Never Used  Substance Use Topics   Alcohol use: Not Currently   Drug use: Never    Review of Systems Per HPI unless specifically indicated above     Objective:    BP 124/76 (BP Location: Right Arm, Patient Position: Sitting, Cuff Size: Large)   Pulse 72   Ht 5' 4 (1.626 m)   Wt 216 lb 4 oz (98.1 kg)   LMP 11/24/2017 (Exact Date)   SpO2 95%   BMI 37.12 kg/m   Wt Readings from Last  3 Encounters:  08/20/24 216 lb 4 oz (98.1 kg)  05/06/24 213 lb 2 oz (96.7 kg)  04/07/24 215 lb (97.5 kg)    Physical Exam Vitals and nursing note reviewed.  Constitutional:      General: She is not in acute distress.    Appearance: Normal appearance. She is well-developed. She is not diaphoretic.     Comments: Well-appearing, comfortable, cooperative  HENT:     Head: Normocephalic and atraumatic.  Eyes:     General:        Right eye: No discharge.        Left eye: No discharge.     Conjunctiva/sclera: Conjunctivae normal.  Cardiovascular:     Rate and Rhythm: Normal rate.  Pulmonary:     Effort: Pulmonary effort is normal.  Skin:    General: Skin is warm and dry.     Findings: No erythema or rash.  Neurological:     Mental Status: She is alert and oriented to person, place, and time.  Psychiatric:        Mood and Affect: Mood normal.        Behavior: Behavior normal.        Thought Content: Thought content normal.     Comments: Well groomed, good eye contact, normal speech and thoughts. Anxious     Results for orders placed or performed in visit on 05/06/24  POCT HgB A1C   Collection Time: 05/06/24  9:11 AM  Result Value Ref Range   Hemoglobin A1C 5.4 4.0 - 5.6 %   HbA1c POC (<> result, manual entry)     HbA1c, POC (prediabetic range)     HbA1c, POC (controlled diabetic range)        Assessment & Plan:   Problem List Items Addressed This Visit     Generalized anxiety disorder with panic attacks   Relevant Medications   escitalopram  (LEXAPRO ) 5 MG tablet   traZODone  (DESYREL ) 50 MG tablet   busPIRone  (BUSPAR ) 5 MG tablet   Major depressive disorder, recurrent, moderate (HCC) - Primary   Relevant Medications   escitalopram  (LEXAPRO ) 5 MG tablet   traZODone  (DESYREL ) 50 MG tablet   busPIRone  (BUSPAR ) 5 MG tablet   Psychophysiological insomnia   Relevant Medications   traZODone  (DESYREL ) 50 MG tablet     Generalized anxiety disorder with comorbid depression  and insomnia Increased anxiety, demotivation, and insomnia exacerbated by stressors. Lexapro  preferred for efficacy and low side effects. Trazodone  safe for sleep. Buspar  used for acute anxiety. Discussed therapy importance and potential medication adjustments.  Similar to prior flare 08/2022, however this is not as severe, seems she has presented earlier prior  to worsening mood/anxiety   - Restarted Lexapro  5 mg daily, option to increase to 10 mg. - Discontinue Ready Calm 3-7 days before Lexapro  to avoid serotonin syndrome. Given 5-HTP modulation - Continue behavioral therapy, discuss medication plan with therapist. - Prescribed trazodone  for sleep as needed. - Prescribed Buspar  for acute anxiety, adjusted to twice daily. PRN - Follow-up in 3 months to assess medication efficacy and adjust treatment.  - In past she required FMLA leave and intermittent leave to manage her mental health. Offered again she will consider if necessary in future     No orders of the defined types were placed in this encounter.   Meds ordered this encounter  Medications   escitalopram  (LEXAPRO ) 5 MG tablet    Sig: Take 1 tablet (5 mg total) by mouth daily.    Dispense:  30 tablet    Refill:  2   traZODone  (DESYREL ) 50 MG tablet    Sig: Take 0.5-1 tablets (25-50 mg total) by mouth at bedtime as needed for sleep.    Dispense:  30 tablet    Refill:  2   busPIRone  (BUSPAR ) 5 MG tablet    Sig: Take 1 tablet (5 mg total) by mouth 2 (two) times daily as needed (anxiety).    Dispense:  60 tablet    Refill:  2    F41.1    Follow up plan: Return in about 3 months (around 11/20/2024) for 3 month follow-up Anxiety/Depression updates.   Marsa Officer, DO [facility] 08/20/2024, 8:35 AM

## 2024-08-20 NOTE — Patient Instructions (Addendum)
 Thank you for coming to the office today.  Medications ordered  Start with low dose Lexapro  5mg , we can adjust in future if needed. Give it 3-4 weeks to take full effect  Stop Redi Calm before taking lexapro , 3-7 days before.  Keep up with therapy  And let me know if need anything sooner.  Please schedule a Follow-up Appointment to: Return in about 3 months (around 11/20/2024) for 3 month follow-up Anxiety/Depression updates.  If you have any other questions or concerns, please feel free to call the office or send a message through MyChart. You may also schedule an earlier appointment if necessary.  Additionally, you may be receiving a survey about your experience at our office within a few days to 1 week by e-mail or mail. We value your feedback.  Marsa Officer, DO [facility] [place], [place]

## 2024-09-11 ENCOUNTER — Other Ambulatory Visit: Payer: Self-pay | Admitting: Family Medicine

## 2024-09-11 DIAGNOSIS — F410 Panic disorder [episodic paroxysmal anxiety] without agoraphobia: Secondary | ICD-10-CM

## 2024-09-11 DIAGNOSIS — F5104 Psychophysiologic insomnia: Secondary | ICD-10-CM

## 2024-09-11 DIAGNOSIS — F331 Major depressive disorder, recurrent, moderate: Secondary | ICD-10-CM

## 2024-09-16 NOTE — Telephone Encounter (Signed)
 Requested Prescriptions  Refused Prescriptions Disp Refills   escitalopram  (LEXAPRO ) 5 MG tablet [Pharmacy Med Name: ESCITALOPRAM  5 MG TABLET] 90 tablet 1    Sig: TAKE 1 TABLET (5 MG TOTAL) BY MOUTH DAILY.     Psychiatry:  Antidepressants - SSRI Passed - 09/16/2024 11:56 AM      Passed - Completed PHQ-2 or PHQ-9 in the last 360 days      Passed - Valid encounter within last 6 months    Recent Outpatient Visits           3 weeks ago Major depressive disorder, recurrent, moderate (HCC)   [place] [facility] [place], Marsa PARAS, DO   4 months ago Type 2 diabetes mellitus with other specified complication, without long-term current use of insulin [facility])   [place] [facility] [place], Marsa PARAS, DO       Future Appointments             In 1 month Alm Delon SAILOR, DO [facility]             busPIRone  (BUSPAR ) 5 MG tablet [Pharmacy Med Name: BUSPIRONE  HCL 5 MG TABLET] 180 tablet 1    Sig: TAKE 1 TABLET (5 MG TOTAL) BY MOUTH TWICE A DAY AS NEEDED FOR ANXIETY     Psychiatry: Anxiolytics/Hypnotics - Non-controlled Passed - 09/16/2024 11:56 AM      Passed - Valid encounter within last 12 months    Recent Outpatient Visits           3 weeks ago Major depressive disorder, recurrent, moderate (HCC)   [place] [facility] [place], Marsa PARAS, DO   4 months ago Type 2 diabetes mellitus with other specified complication, without long-term current use of insulin [facility])   [facility] [place], Marsa PARAS, DO       Future Appointments             In 1 month Alm Delon SAILOR, DO [facility]             traZODone  (DESYREL ) 50 MG tablet [Pharmacy Med Name: TRAZODONE  50 MG TABLET] 90 tablet 1    Sig: TAKE 0.5-1 TABLETS BY MOUTH AT BEDTIME AS NEEDED FOR SLEEP.     Psychiatry: Antidepressants - Serotonin Modulator Passed - 09/16/2024 11:56 AM      Passed  - Completed PHQ-2 or PHQ-9 in the last 360 days      Passed - Valid encounter within last 6 months    Recent Outpatient Visits           3 weeks ago Major depressive disorder, recurrent, moderate (HCC)   [place] [facility] [place], Marsa PARAS, DO   4 months ago Type 2 diabetes mellitus with other specified complication, without long-term current use of insulin [facility])   [facility] [place] Marsa PARAS, DO       Future Appointments             In 1 month Alm Delon SAILOR, DO [facility]

## 2024-09-19 ENCOUNTER — Ambulatory Visit: Admitting: Psychology

## 2024-09-26 ENCOUNTER — Ambulatory Visit: Admitting: Psychology

## 2024-09-26 DIAGNOSIS — F331 Major depressive disorder, recurrent, moderate: Secondary | ICD-10-CM

## 2024-09-26 NOTE — Progress Notes (Signed)
  Behavioral Health Counselor/Therapist Progress Note  Patient ID: Kara Porter, MRN: [id number],    Date: 09/26/2024  Time Spent: 47 mins   start time: 1000   end time 1047  Treatment Type: Individual Therapy  Reported Symptoms: Pt presents for the session via Caregility video; pt grants consent for the session, stating she is in her home with no one else present; pt understands the limits of virtual sessions.  I shared with pt that I am in my office with no one else here either.  Mental Status Exam: Appearance:  Casual     Behavior: Appropriate  Motor: Normal  Speech/Language:  Clear and Coherent  Affect: Appropriate  Mood: depressed  Thought process: normal  Thought content:   WNL  Sensory/Perceptual disturbances:   WNL  Orientation: oriented to person, place, and time/date  Attention: Good  Concentration: Good  Memory: WNL  Fund of knowledge:  Good  Insight:   Good  Judgment:  Good  Impulse Control: Good   Risk Assessment: Danger to Self:  No Self-injurious Behavior: No Danger to Others: No Duty to Warn:no Physical Aggression / Violence:No  Access to Firearms a concern: No  Gang Involvement:No   Notified of retirement  Subjective: Pt shares that I have not been great since our last session.  I did go to my PCP and he did prescribe Lexapro  5mg ; the first week I did not like it because I felt like a zombie.  That feeling has lessened.  I believe the anxiety is less now.  Talked with pt about how it takes about 4 wks for the Lexapro  to build up in her system.  She shares that she feels better about the medication now.  Pt is considering a LOA from work but she is trying to push through without it right now.  Her manager is less available to her right now because the manager's sister is being treated for cancer and she is distracted.  Pt shares that her mom is in the hospital again and was in the hospital the week before they went for Thanksgiving.  Her whole  family went to see pt's mom (31 yo) in [facility] for Thanksgiving.  Her mom was very frail but it was great to see her.  Pt was confronted with the realization that her mom will not be here forever.  Pt shares that her mom is white [employer]) and she has remarried a white man a number of years ago and her husband has always been good for pt's mom.  Pt shares that she has interviewed for two jobs since our last session; I have second interviews with each job opportunity.  Pt shares that she has put up her holiday decorations and she set a budget and bought gifts; I am just not experiencing joy this time of year.  Pt continues to be involved in her church and knows that is good for her.  Pt continues to walk as much as she can; her annual physical is next month and she is looking forward to having her lab work done; she has a massage scheduled for next week and she is looking forward to that.  Encouraged pt to be as intentional as possible about engaging in her self care activities between now and our follow up session in 4 weeks.  Interventions: Cognitive Behavioral Therapy  Diagnosis:Major depressive disorder, recurrent, moderate (HCC)  Plan: Treatment Plan Strengths/Abilities:  Intelligent, Intuitive, Willing to participate in therapy Treatment Preferences:  Outpatient Individual Therapy  Statement of Needs:  Patient is to use CBT, mindfulness and coping skills to help manage and/or decrease symptoms associated with their diagnosis. Symptoms:  Depressed/Irritable mood, worry, social withdrawal Problems Addressed:  Depressive thoughts, Sadness, Sleep issues, etc. Long Term Goals:  Pt to reduce overall level, frequency, and intensity of the feelings of depression as evidenced by decreased irritability, negative self talk, and helpless feelings from 6 to 7 days/week to 0 to 1 days/week, per client report, for at least 3 consecutive months.  Progress: 10% Short Term Goals:  Pt to verbally express  understanding of the relationship between feelings of depression and their impact on thinking patterns and behaviors.  Pt to verbalize an understanding of the role that distorted thinking plays in creating fears, excessive worry, and ruminations.  Progress: 10% Target Date:  08/31/2025 Frequency:  Bi-weekly Modality:  Cognitive Behavioral Therapy Interventions by Therapist:  Therapist will use CBT, Mindfulness exercises, Coping skills and Referrals, as needed by client. Client has verbally approved this treatment plan.  [name], [facility]

## 2024-10-24 ENCOUNTER — Ambulatory Visit: Admitting: Psychology

## 2024-10-24 DIAGNOSIS — F331 Major depressive disorder, recurrent, moderate: Secondary | ICD-10-CM

## 2024-10-24 NOTE — Progress Notes (Signed)
 "  [facility] Counselor/Therapist Progress Note  Patient ID: Kara Porter, MRN: [id number],    Date: 10/24/2024  Time Spent: 50 mins   start time: 1000   end time 1050  Treatment Type: Individual Therapy  Reported Symptoms: Pt presents for the session via Caregility video; pt grants consent for the session, stating she is in her home with no one else present; pt understands the limits of virtual sessions.  I shared with pt that I am in my office with no one else here either.  Mental Status Exam: Appearance:  Casual     Behavior: Appropriate  Motor: Normal  Speech/Language:  Clear and Coherent  Affect: Appropriate  Mood: depressed  Thought process: normal  Thought content:   WNL  Sensory/Perceptual disturbances:   WNL  Orientation: oriented to person, place, and time/date  Attention: Good  Concentration: Good  Memory: WNL  Fund of knowledge:  Good  Insight:   Good  Judgment:  Good  Impulse Control: Good   Risk Assessment: Danger to Self:  No Self-injurious Behavior: No Danger to Others: No Duty to Warn:no Physical Aggression / Violence:No  Access to Firearms a concern: No  Gang Involvement:No   Notified of retirement  Subjective: Pt shares that I have been OK since our last session.  The holidays were good for us  and I have good news; I have a new job and I feel very good about that.  I finished my last job last week and I start the new job with [employer] on Monday.  I am really excited about this new opportunity.  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  She will be making more money and it is a promotion for her.  Pt shares she gave her notice to her previous production designer, theatre/television/film and her manager was not engaging with her at all about her leaving; pt had to ask the senior director for her final reference because her psychologist, educational never responded.  This situation confirmed for pt that she did the right thing by  leaving the previous job.  Pt is taking this week off between the previous job and the new job.  She has been engaging in self care all week; she went to a yoga class, she spent Wednesday with her daughter and that was great for pt.  Pt understands that there will be a learning curve for her in the new job and she also brings skills and abilities to this new position.  Pt continues to take her Lexapro  daily and she knows that it is being beneficial for her; she knows that she is not over-reacting to situations anymore.  Pt shares that her mom is still living in [facility] and was discharged from the hospital; she is sounding better now and pt feels good about that situation.  Her mom is a recovering alcoholic and she goes to [employer]; she is also going to church as well; she is doing PT as well since she was hospitalized for a while.  Pt and her daughter went to a Christmas eve service at church and she has been going to church since then as well.  She will be volunteering in the nursery at church and that is a big deal for her as well because she has trouble joining things.  She and her daughter will also entering back into the [employer] groups at church soon.  Pt continues to walk as  much as she can on her walking pad in the home; her annual physical is later this month and she is looking forward to having her lab work done; pt shares that she had her massage a couple of weeks ago and she enjoyed that; she has also enjoyed her yoga class as well.  Encouraged pt to be as intentional as possible about engaging in her self care activities between now and our follow up session in 4 weeks.  Interventions: Cognitive Behavioral Therapy  Diagnosis:Major depressive disorder, recurrent, moderate (HCC)  Plan: Treatment Plan Strengths/Abilities:  Intelligent, Intuitive, Willing to participate in therapy Treatment Preferences:  Outpatient Individual Therapy Statement of Needs:  Patient is to use CBT, mindfulness and  coping skills to help manage and/or decrease symptoms associated with their diagnosis. Symptoms:  Depressed/Irritable mood, worry, social withdrawal Problems Addressed:  Depressive thoughts, Sadness, Sleep issues, etc. Long Term Goals:  Pt to reduce overall level, frequency, and intensity of the feelings of depression as evidenced by decreased irritability, negative self talk, and helpless feelings from 6 to 7 days/week to 0 to 1 days/week, per client report, for at least 3 consecutive months.  Progress: 10% Short Term Goals:  Pt to verbally express understanding of the relationship between feelings of depression and their impact on thinking patterns and behaviors.  Pt to verbalize an understanding of the role that distorted thinking plays in creating fears, excessive worry, and ruminations.  Progress: 10% Target Date:  08/31/2025 Frequency:  Bi-weekly Modality:  Cognitive Behavioral Therapy Interventions by Therapist:  Therapist will use CBT, Mindfulness exercises, Coping skills and Referrals, as needed by client. Client has verbally approved this treatment plan.  [name], [facility] "

## 2024-11-03 ENCOUNTER — Encounter: Payer: Self-pay | Admitting: Dermatology

## 2024-11-03 ENCOUNTER — Ambulatory Visit: Admitting: Dermatology

## 2024-11-03 VITALS — BP 110/62

## 2024-11-03 DIAGNOSIS — D1801 Hemangioma of skin and subcutaneous tissue: Secondary | ICD-10-CM | POA: Diagnosis not present

## 2024-11-03 DIAGNOSIS — W908XXA Exposure to other nonionizing radiation, initial encounter: Secondary | ICD-10-CM

## 2024-11-03 DIAGNOSIS — L918 Other hypertrophic disorders of the skin: Secondary | ICD-10-CM

## 2024-11-03 DIAGNOSIS — L304 Erythema intertrigo: Secondary | ICD-10-CM

## 2024-11-03 DIAGNOSIS — L814 Other melanin hyperpigmentation: Secondary | ICD-10-CM

## 2024-11-03 DIAGNOSIS — Z1283 Encounter for screening for malignant neoplasm of skin: Secondary | ICD-10-CM

## 2024-11-03 DIAGNOSIS — D229 Melanocytic nevi, unspecified: Secondary | ICD-10-CM

## 2024-11-03 DIAGNOSIS — L811 Chloasma: Secondary | ICD-10-CM | POA: Diagnosis not present

## 2024-11-03 DIAGNOSIS — L821 Other seborrheic keratosis: Secondary | ICD-10-CM | POA: Diagnosis not present

## 2024-11-03 DIAGNOSIS — Z872 Personal history of diseases of the skin and subcutaneous tissue: Secondary | ICD-10-CM | POA: Diagnosis not present

## 2024-11-03 DIAGNOSIS — L578 Other skin changes due to chronic exposure to nonionizing radiation: Secondary | ICD-10-CM

## 2024-11-03 NOTE — Progress Notes (Signed)
 "  New Patient Visit   Subjective  Kara Porter is a 64 y.o. female who presents for the following: Skin Cancer Screening and Full Body Skin Exam - She had a full body skin exam last year with another dermatologist but did not feel good about the experience and would like another doctor to look her over. She did have a biopsy on her right leg but she does not know the results. Her mother has a history of squamous cell carcinoma and basal cell carcinoma.  The patient presents for Total-Body Skin Exam (TBSE) for skin cancer screening and mole check. The patient has spots, moles and lesions to be evaluated, some may be new or changing and the patient may have concern these could be cancer.    The following portions of the chart were reviewed this encounter and updated as appropriate: medications, allergies, medical history  Review of Systems:  No other skin or systemic complaints except as noted in HPI or Assessment and Plan.  Objective  Well appearing patient in no apparent distress; mood and affect are within normal limits.  A full examination was performed including scalp, head, eyes, ears, nose, lips, neck, chest, axillae, abdomen, back, buttocks, bilateral upper extremities, bilateral lower extremities, hands, feet, fingers, toes, fingernails, and toenails. All findings within normal limits unless otherwise noted below.   Relevant physical exam findings are noted in the Assessment and Plan.    Assessment & Plan   SKIN CANCER SCREENING PERFORMED TODAY.  ACTINIC DAMAGE - Chronic condition, secondary to cumulative UV/sun exposure - diffuse scaly erythematous macules with underlying dyspigmentation - Recommend daily broad spectrum sunscreen SPF 30+ to sun-exposed areas, reapply every 2 hours as needed.  - Staying in the shade or wearing long sleeves, sun glasses (UVA+UVB protection) and wide brim hats (4-inch brim around the entire circumference of the hat) are also recommended for sun  protection.  - Call for new or changing lesions.  LENTIGINES, SEBORRHEIC KERATOSES, HEMANGIOMAS - Benign normal skin lesions - Benign-appearing - Call for any changes  MELANOCYTIC NEVI - Tan-brown and/or pink-flesh-colored symmetric macules and papules - Benign appearing on exam today - Observation - Call clinic for new or changing moles - Recommend daily use of broad spectrum spf 30+ sunscreen to sun-exposed areas.   MELASMA Exam: reticulated hyperpigmented patches at face   Melasma is a chronic; persistent condition of hyperpigmented patches generally on the face, worse in summer due to higher UV exposure.    Heredity; thyroid  disease; sun exposure; pregnancy; birth control pills; epilepsy medication and darker skin may predispose to Melasma.   Recommendations include: - Sun avoidance and daily broad spectrum (UVA/UVB) tinted mineral sunscreen SPF 30+, with Zinc or Titanium Dioxide.   Recommend daily broad spectrum (UVA/UVB) tinted mineral sunscreen SPF 30+, with Zinc or Titanium Dioxide.   INTERTRIGO Exam: Erythematous macerated patches in body folds   Intertrigo is a chronic recurrent rash that occurs in skin fold areas that may be associated with friction; heat; moisture; yeast; fungus; and bacteria.  It is exacerbated by increased movement / activity; sweating; and higher atmospheric temperature.  Use of an absorbant powder such as Zeasorb AF powder or other OTC antifungal powder to the area daily can prevent rash recurrence. Other options to help keep the area dry include blow drying the area after bathing or using antiperspirant products such as Duradry sweat minimizing gel.  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  Treatment Plan: We will request the pathology result from [facility]. Recommend sunscreen over scar.   Acrochordons (Skin Tags) - Fleshy, skin-colored  pedunculated papules of neck - Benign appearing.  - Observe. - If desired, they can be removed with an in office procedure that is not covered by insurance. - Please call the clinic if you notice any new or changing lesions.       Return in about 1 year (around 11/03/2025) for TBSE.  I, Roseline Hutchinson, CMA, am acting as scribe for [employer], DO .   Documentation: I have reviewed the above documentation for accuracy and completeness, and I agree with the above.  Delon Lenis, DO    "

## 2024-11-03 NOTE — Patient Instructions (Signed)

## 2024-11-06 ENCOUNTER — Other Ambulatory Visit: Payer: Self-pay

## 2024-11-06 DIAGNOSIS — E7800 Pure hypercholesterolemia, unspecified: Secondary | ICD-10-CM

## 2024-11-06 DIAGNOSIS — E559 Vitamin D deficiency, unspecified: Secondary | ICD-10-CM

## 2024-11-06 DIAGNOSIS — Z0000 Encounter for general adult medical examination without abnormal findings: Secondary | ICD-10-CM

## 2024-11-06 DIAGNOSIS — E1169 Type 2 diabetes mellitus with other specified complication: Secondary | ICD-10-CM

## 2024-11-07 ENCOUNTER — Other Ambulatory Visit: Payer: Self-pay | Admitting: Family Medicine

## 2024-11-07 DIAGNOSIS — E1169 Type 2 diabetes mellitus with other specified complication: Secondary | ICD-10-CM

## 2024-11-07 LAB — CBC WITH DIFFERENTIAL/PLATELET
Absolute Lymphocytes: 2349 {cells}/uL (ref 850–3900)
Absolute Monocytes: 465 {cells}/uL (ref 200–950)
Basophils Absolute: 33 {cells}/uL (ref 0–200)
Basophils Relative: 0.4 %
Eosinophils Absolute: 50 {cells}/uL (ref 15–500)
Eosinophils Relative: 0.6 %
HCT: 43.3 % (ref 35.9–46.0)
Hemoglobin: 13.6 g/dL (ref 11.7–15.5)
MCH: 28.8 pg (ref 27.0–33.0)
MCHC: 31.4 g/dL — ABNORMAL LOW (ref 31.6–35.4)
MCV: 91.5 fL (ref 81.4–101.7)
MPV: 9.9 fL (ref 7.5–12.5)
Monocytes Relative: 5.6 %
Neutro Abs: 5403 {cells}/uL (ref 1500–7800)
Neutrophils Relative %: 65.1 %
Platelets: 399 Thousand/uL (ref 140–400)
RBC: 4.73 Million/uL (ref 3.80–5.10)
RDW: 13.4 % (ref 11.0–15.0)
Total Lymphocyte: 28.3 %
WBC: 8.3 Thousand/uL (ref 3.8–10.8)

## 2024-11-07 LAB — MICROALBUMIN / CREATININE URINE RATIO
Creatinine, Urine: 191 mg/dL (ref 20–275)
Microalb Creat Ratio: 5 mg/g{creat}
Microalb, Ur: 1 mg/dL

## 2024-11-07 LAB — COMPREHENSIVE METABOLIC PANEL WITH GFR
AG Ratio: 1.6 (calc) (ref 1.0–2.5)
ALT: 15 U/L (ref 6–29)
AST: 17 U/L (ref 10–35)
Albumin: 4.5 g/dL (ref 3.6–5.1)
Alkaline phosphatase (APISO): 126 U/L (ref 37–153)
BUN: 15 mg/dL (ref 7–25)
CO2: 28 mmol/L (ref 20–32)
Calcium: 9.8 mg/dL (ref 8.6–10.4)
Chloride: 104 mmol/L (ref 98–110)
Creat: 0.58 mg/dL (ref 0.50–1.05)
Globulin: 2.9 g/dL (ref 1.9–3.7)
Glucose, Bld: 97 mg/dL (ref 65–99)
Potassium: 4.4 mmol/L (ref 3.5–5.3)
Sodium: 140 mmol/L (ref 135–146)
Total Bilirubin: 0.6 mg/dL (ref 0.2–1.2)
Total Protein: 7.4 g/dL (ref 6.1–8.1)
eGFR: 103 mL/min/1.73m2

## 2024-11-07 LAB — LIPID PANEL
Cholesterol: 207 mg/dL — ABNORMAL HIGH
HDL: 56 mg/dL
LDL Cholesterol (Calc): 128 mg/dL — ABNORMAL HIGH
Non-HDL Cholesterol (Calc): 151 mg/dL — ABNORMAL HIGH
Total CHOL/HDL Ratio: 3.7 (calc)
Triglycerides: 121 mg/dL

## 2024-11-07 LAB — HEMOGLOBIN A1C
Hgb A1c MFr Bld: 5.5 %
Mean Plasma Glucose: 111 mg/dL
eAG (mmol/L): 6.2 mmol/L

## 2024-11-07 LAB — TSH: TSH: 1.43 m[IU]/L (ref 0.40–4.50)

## 2024-11-07 LAB — VITAMIN D 25 HYDROXY (VIT D DEFICIENCY, FRACTURES): Vit D, 25-Hydroxy: 42 ng/mL (ref 30–100)

## 2024-11-07 MED ORDER — MOUNJARO 5 MG/0.5ML ~~LOC~~ SOAJ: 5.0000 mg | SUBCUTANEOUS | 5 refills | Status: AC

## 2024-11-07 NOTE — Telephone Encounter (Signed)
 Copied from CRM #[id number]. Topic: Clinical - Medication Refill >> Nov 07, 2024  9:26 AM Tiffany B wrote: Patient would like this expedited and would like to stay on course.  Medication: MOUNJARO  5 MG/0.5ML Pen  Has the patient contacted their pharmacy? Yes, contact PCP office a new script is needed.  This is the patient's preferred pharmacy:  [id number] [place], [place] - [address] [place] [address] Phone: [phone] Fax: [phone]   Is this the correct pharmacy for this prescription? Yes If no, delete pharmacy and type the correct one.   Has the prescription been filled recently? Yes  Is the patient out of the medication? Yes  Has the patient been seen for an appointment in the last year OR does the patient have an upcoming appointment? Yes  Can we respond through MyChart? Yes  Agent: Please be advised that Rx refills may take up to 3 business days. We ask that you follow-up with your pharmacy.

## 2024-11-14 ENCOUNTER — Encounter: Payer: Self-pay | Admitting: Family Medicine

## 2024-11-15 ENCOUNTER — Other Ambulatory Visit: Payer: Self-pay | Admitting: Family Medicine

## 2024-11-15 DIAGNOSIS — F410 Panic disorder [episodic paroxysmal anxiety] without agoraphobia: Secondary | ICD-10-CM

## 2024-11-15 DIAGNOSIS — F331 Major depressive disorder, recurrent, moderate: Secondary | ICD-10-CM

## 2024-11-15 DIAGNOSIS — F5104 Psychophysiologic insomnia: Secondary | ICD-10-CM

## 2024-11-17 NOTE — Telephone Encounter (Signed)
 Requested Prescriptions  Pending Prescriptions Disp Refills   escitalopram  (LEXAPRO ) 5 MG tablet [Pharmacy Med Name: ESCITALOPRAM  5 MG TABLET] 30 tablet 2    Sig: TAKE 1 TABLET (5 MG TOTAL) BY MOUTH DAILY.     Psychiatry:  Antidepressants - SSRI Failed - 11/17/2024  4:43 PM      Failed - Valid encounter within last 6 months    Recent Outpatient Visits           2 months ago Major depressive disorder, recurrent, moderate (HCC)   [place] [facility] [place], Marsa PARAS, DO   6 months ago Type 2 diabetes mellitus with other specified complication, without long-term current use of insulin [facility])   [place] [facility] [place], Marsa PARAS, DO              Passed - Completed PHQ-2 or PHQ-9 in the last 360 days       busPIRone  (BUSPAR ) 5 MG tablet [Pharmacy Med Name: BUSPIRONE  HCL 5 MG TABLET] 60 tablet 2    Sig: TAKE 1 TABLET (5 MG TOTAL) BY MOUTH TWICE A DAY AS NEEDED FOR ANXIETY     Psychiatry: Anxiolytics/Hypnotics - Non-controlled Passed - 11/17/2024  4:43 PM      Passed - Valid encounter within last 12 months    Recent Outpatient Visits           2 months ago Major depressive disorder, recurrent, moderate (HCC)   [place] [facility] [place], Marsa PARAS, DO   6 months ago Type 2 diabetes mellitus with other specified complication, without long-term current use of insulin [facility])   [place] [facility] [place], Marsa PARAS, DO               traZODone  (DESYREL ) 50 MG tablet [Pharmacy Med Name: TRAZODONE  50 MG TABLET] 30 tablet 2    Sig: TAKE 0.5-1 TABLETS BY MOUTH AT BEDTIME AS NEEDED FOR SLEEP.     Psychiatry: Antidepressants - Serotonin Modulator Failed - 11/17/2024  4:43 PM      Failed - Valid encounter within last 6 months    Recent Outpatient Visits           2 months ago Major depressive disorder, recurrent, moderate (HCC)   [place] [facility]  [place], Marsa PARAS, DO   6 months ago Type 2 diabetes mellitus with other specified complication, without long-term current use of insulin [facility])   [place] [facility] [place], Marsa PARAS, DO              Passed - Completed PHQ-2 or PHQ-9 in the last 360 days

## 2024-11-18 ENCOUNTER — Encounter: Payer: Self-pay | Admitting: Family Medicine

## 2024-11-20 ENCOUNTER — Encounter: Payer: Self-pay | Admitting: Family Medicine

## 2024-11-20 ENCOUNTER — Ambulatory Visit: Admitting: Family Medicine

## 2024-11-20 VITALS — BP 108/60 | HR 75 | Ht 64.0 in | Wt 212.2 lb

## 2024-11-20 DIAGNOSIS — B009 Herpesviral infection, unspecified: Secondary | ICD-10-CM | POA: Diagnosis not present

## 2024-11-20 DIAGNOSIS — J3089 Other allergic rhinitis: Secondary | ICD-10-CM | POA: Diagnosis not present

## 2024-11-20 DIAGNOSIS — Z7985 Long-term (current) use of injectable non-insulin antidiabetic drugs: Secondary | ICD-10-CM | POA: Diagnosis not present

## 2024-11-20 DIAGNOSIS — G4733 Obstructive sleep apnea (adult) (pediatric): Secondary | ICD-10-CM | POA: Diagnosis not present

## 2024-11-20 DIAGNOSIS — F411 Generalized anxiety disorder: Secondary | ICD-10-CM | POA: Diagnosis not present

## 2024-11-20 DIAGNOSIS — F3341 Major depressive disorder, recurrent, in partial remission: Secondary | ICD-10-CM | POA: Diagnosis not present

## 2024-11-20 DIAGNOSIS — Z1231 Encounter for screening mammogram for malignant neoplasm of breast: Secondary | ICD-10-CM

## 2024-11-20 DIAGNOSIS — M48061 Spinal stenosis, lumbar region without neurogenic claudication: Secondary | ICD-10-CM | POA: Diagnosis not present

## 2024-11-20 DIAGNOSIS — F410 Panic disorder [episodic paroxysmal anxiety] without agoraphobia: Secondary | ICD-10-CM

## 2024-11-20 DIAGNOSIS — R609 Edema, unspecified: Secondary | ICD-10-CM | POA: Diagnosis not present

## 2024-11-20 DIAGNOSIS — E1169 Type 2 diabetes mellitus with other specified complication: Secondary | ICD-10-CM

## 2024-11-20 DIAGNOSIS — Z0000 Encounter for general adult medical examination without abnormal findings: Secondary | ICD-10-CM

## 2024-11-20 MED ORDER — HYDROCHLOROTHIAZIDE 25 MG PO TABS: 25.0000 mg | ORAL_TABLET | Freq: Every day | ORAL | 3 refills | Status: AC

## 2024-11-20 MED ORDER — MONTELUKAST SODIUM 10 MG PO TABS: 10.0000 mg | ORAL_TABLET | Freq: Every day | ORAL | 3 refills | Status: AC

## 2024-11-20 MED ORDER — ESCITALOPRAM OXALATE 5 MG PO TABS: 5.0000 mg | ORAL_TABLET | Freq: Every day | ORAL | 3 refills | Status: AC

## 2024-11-20 MED ORDER — MELOXICAM 15 MG PO TABS: 15.0000 mg | ORAL_TABLET | ORAL | 3 refills | Status: AC | PRN

## 2024-11-20 MED ORDER — VALACYCLOVIR HCL 500 MG PO TABS: ORAL_TABLET | ORAL | 2 refills | Status: AC

## 2024-11-20 NOTE — Progress Notes (Signed)
 "  Subjective:    Patient ID: Kara Porter, female    DOB: [birth year], 64 y.o.   MRN: [id number]  Kara Porter is a 64 y.o. female presenting on 11/20/2024 for Annual Exam   HPI  Discussed the use of AI scribe software for clinical note transcription with the patient, who gave verbal consent to proceed.  History of Present Illness   [name] is a 64 year old female who presents for an annual physical exam.   Vaccination concerns - Hesitant to receive shingles vaccine due to concerns about side effects, especially after starting a new job - Open to receiving pneumonia vaccine, perceives it as easy and without significant side effects  Osteoarthritis and nsaid use - Taking meloxicam  five days per week, especially on weekends with increased activity - Increased frequency as advised by orthopedic specialist - Concerned about potential renal effects from meloxicam   Hypertension and diuretic therapy - Blood pressure well controlled with hydrochlorothiazide  - Uses hydrochlorothiazide  primarily for fluid management - Considering dose reduction to 12.5 mg due to occasional strong urine and possible dehydration  Morbid Obesity BMI >36 Obesity and weight management - Currently on Mounjaro  5 mg for weight management - Weight reduced from 216 to 212 pounds - Actively pursuing further weight loss through dietary changes and exercise tailored to back issues  Hematologic monitoring and periodontal symptoms - Regularly monitors hemoglobin levels due to history of blood donation - Experiences gum bleeding attributed to periodontal issues - Hemoglobin levels currently stable     Type 2 Diabetes with Hyperlipidemia, Controlled DM Well controlled A1c 5.5 Meds:  Mounjaro  5mg  weekly inj, prefers the low dose, since she is improving her lifestyle mostly OFF Metformin    HYPERLIPIDEMIA: - Reports no concerns. Last lipid panel 10/2024, controlled down to LDL 128 Fam history of  hyperlipidemia Not on statin Improving lifestyle and managing weight   Morbid Obesity BMI >36 Significant improved wt   OSA on CPAP She received her CPAP machine, setting 10 pressure. She has light weight mask that works well and she feels good with it. - Today reports that sleep apnea is well controlled. She uses the CPAP machine every night. Tolerates the machine well, and thinks that sleeps better with it and feels good. No new concerns or symptoms. - She has nearly 100% compliance rate and doing well on it   Generalized Anxiety Disorder w/ Panic Attacks Major Depression Recurrent, in partial remission improved On Escitalopram   5 mg daily Buspar  is AS NEEDED      IBS / Constipation/ Functional GI    Health Maintenance:   Due for next Mammogram anytime, last was 09/2022. Negative. Order is in today.   Shingrix due in future. Hesitant  Prevnar-20 pneumonia vaccine declined today.   Cologuard 12/2022, negative, next due 12/2025.   Next Cervical CA Screen pap smear 2027.     11/20/2024    8:32 AM 08/20/2024    8:29 AM 05/06/2024    8:47 AM  Depression screen PHQ 2/9  Decreased Interest 1 2 0  Down, Depressed, Hopeless 0 2 0  PHQ - 2 Score 1 4 0  Altered sleeping 0 2 1  Tired, decreased energy 1 2 1   Change in appetite 1 2 1   Feeling bad or failure about yourself  0 2 0  Trouble concentrating  3 1  Moving slowly or fidgety/restless 0 1 0  Suicidal thoughts 0 0 0  PHQ-9 Score 3 16  4    Difficult doing work/chores  Somewhat difficult Somewhat difficult Not difficult at all     Data saved with a previous flowsheet row definition       11/20/2024    8:32 AM 08/20/2024    8:30 AM 05/06/2024    8:47 AM 11/06/2023    8:20 AM  GAD 7 : Generalized Anxiety Score  Nervous, Anxious, on Edge 0 3  1  1    Control/stop worrying 0 3  1  0   Worry too much - different things 0 3  1  1    Trouble relaxing 1 3  1  1    Restless 0 2  0  0   Easily annoyed or irritable 1 2  1  1    Afraid  - awful might happen 0 3  0  0   Total GAD 7 Score 2 19 5 4   Anxiety Difficulty Not difficult at all Very difficult Not difficult at all Not difficult at all     Data saved with a previous flowsheet row definition     Past Medical History:  Diagnosis Date   Irritable bowel syndrome 2019   Past Surgical History:  Procedure Laterality Date   CESAREAN SECTION  1991 and 1997   GALLBLADDER SURGERY  1993   HYSTEROSCOPY  2018   Social History   Socioeconomic History   Marital status: Married    Spouse name: Not on file   Number of children: 2   Years of education: College   Highest education level: Bachelor's degree (e.g., BA, AB, BS)  Occupational History   Occupation: HR Manager    Comment: [employer]  Tobacco Use   Smoking status: Former    Current packs/day: 0.00    Average packs/day: 0.8 packs/day for 6.0 years (4.5 ttl pk-yrs)    Types: Cigarettes    Start date: 2012    Quit date: 2018    Years since quitting: 8.1   Smokeless tobacco: Former  Building Services Engineer status: Never Used  Substance and Sexual Activity   Alcohol use: Not Currently   Drug use: Never   Sexual activity: Not Currently    Birth control/protection: None  Other Topics Concern   Not on file  Social History Narrative   Not on file   Social Drivers of Health   Tobacco Use: Medium Risk (11/20/2024)   Patient History    Smoking Tobacco Use: Former    Smokeless Tobacco Use: Former    Passive Exposure: Not on Actuary Strain: Low Risk (08/19/2024)   Overall Financial Resource Strain (CARDIA)    Difficulty of Paying Living Expenses: Not very hard  Food Insecurity: No Food Insecurity (08/19/2024)   Epic    Worried About Programme Researcher, Broadcasting/film/video in the Last Year: Never true    Ran Out of Food in the Last Year: Never true  Transportation Needs: No Transportation Needs (08/19/2024)   Epic    Lack of Transportation (Medical): No    Lack of Transportation (Non-Medical): No  Physical  Activity: Insufficiently Active (08/19/2024)   Exercise Vital Sign    Days of Exercise per Week: 1 day    Minutes of Exercise per Session: 10 min  Stress: Stress Concern Present (08/19/2024)   [employer] of Occupational Health - Occupational Stress Questionnaire    Feeling of Stress: Rather much  Social Connections: Socially Integrated (08/19/2024)   Social Connection and Isolation Panel    Frequency of Communication with Friends and Family: More than three times  a week    Frequency of Social Gatherings with Friends and Family: Once a week    Attends Religious Services: More than 4 times per year    Active Member of [employer] or Organizations: Yes    Attends Engineer, Structural: More than 4 times per year    Marital Status: Married  Catering Manager Violence: Unknown (01/20/2022)   Received from [facility]   HITS    Physically Hurt: Not on file    Insult or Talk Down To: Not on file    Threaten Physical Harm: Not on file    Scream or Curse: Not on file  Depression (PHQ2-9): Low Risk (11/20/2024)   Depression (PHQ2-9)    PHQ-2 Score: 3  Alcohol Screen: Not on file  Housing: Unknown (08/19/2024)   Epic    Unable to Pay for Housing in the Last Year: No    Number of Times Moved in the Last Year: Not on file    Homeless in the Last Year: No  Utilities: Not on file  Health Literacy: Not on file   Family History  Problem Relation Age of Onset   Hyperlipidemia Mother    Heart disease Mother    Cancer Father    Hyperlipidemia Father    Breast cancer Neg Hx    Adrenal disorder Neg Hx    Current Outpatient Medications on File Prior to Visit  Medication Sig   albuterol  (VENTOLIN  HFA) 108 (90 Base) MCG/ACT inhaler TAKE 2 PUFFS BY MOUTH EVERY 6 HOURS AS NEEDED FOR WHEEZE OR SHORTNESS OF BREATH   Barberry-Oreg Grape-Goldenseal (BERBERINE COMPLEX PO) Take 2 mg by mouth daily.   busPIRone  (BUSPAR ) 5 MG tablet TAKE 1 TABLET (5 MG TOTAL) BY MOUTH TWICE A DAY AS NEEDED FOR ANXIETY    cholecalciferol (VITAMIN D3) 25 MCG (1000 UNIT) tablet Take 1,000 Units by mouth daily.   clotrimazole -betamethasone  (LOTRISONE ) cream Apply 1-2 times a day for worsening flare intertrigo, may re-use daily up to 1 week as needed.   Fiber Adult Gummies 2 g CHEW    hydrocortisone 2.5 % ointment Apply 1 Application topically 2 (two) times daily.   ipratropium (ATROVENT ) 0.06 % nasal spray Place 2 sprays into both nostrils 4 (four) times daily. For up to 5-7 days then stop.   Magnesium 250 MG CAPS Take by mouth.   MOUNJARO  5 MG/0.5ML Pen Inject 5 mg into the skin once a week.   Multiple Vitamin (MULTIVITAMIN) LIQD Take 5 mLs by mouth daily.   traZODone  (DESYREL ) 50 MG tablet TAKE 0.5-1 TABLETS BY MOUTH AT BEDTIME AS NEEDED FOR SLEEP.   No current facility-administered medications on file prior to visit.    Review of Systems  Constitutional:  Negative for activity change, appetite change, chills, diaphoresis, fatigue and fever.  HENT:  Negative for congestion and hearing loss.   Eyes:  Negative for visual disturbance.  Respiratory:  Negative for cough, chest tightness, shortness of breath and wheezing.   Cardiovascular:  Negative for chest pain, palpitations and leg swelling.  Gastrointestinal:  Negative for abdominal pain, constipation, diarrhea, nausea and vomiting.  Genitourinary:  Negative for dysuria, frequency and hematuria.  Musculoskeletal:  Negative for arthralgias and neck pain.  Skin:  Negative for rash.  Neurological:  Negative for dizziness, weakness, light-headedness, numbness and headaches.  Hematological:  Negative for adenopathy.  Psychiatric/Behavioral:  Negative for behavioral problems, dysphoric mood and sleep disturbance.    Per HPI unless specifically indicated above     Objective:  BP 108/60 (BP Location: Right Arm, Patient Position: Sitting, Cuff Size: Normal)   Pulse 75   Ht 5' 4 (1.626 m)   Wt 212 lb 4 oz (96.3 kg)   LMP 11/24/2017   SpO2 95%   BMI  36.43 kg/m   Wt Readings from Last 3 Encounters:  11/20/24 212 lb 4 oz (96.3 kg)  08/20/24 216 lb 4 oz (98.1 kg)  05/06/24 213 lb 2 oz (96.7 kg)    Physical Exam Vitals and nursing note reviewed.  Constitutional:      General: She is not in acute distress.    Appearance: She is well-developed. She is not diaphoretic.     Comments: Well-appearing, comfortable, cooperative  HENT:     Head: Normocephalic and atraumatic.  Eyes:     General:        Right eye: No discharge.        Left eye: No discharge.     Conjunctiva/sclera: Conjunctivae normal.     Pupils: Pupils are equal, round, and reactive to light.  Neck:     Thyroid : No thyromegaly.     Vascular: No carotid bruit.  Cardiovascular:     Rate and Rhythm: Normal rate and regular rhythm.     Pulses: Normal pulses.     Heart sounds: Normal heart sounds. No murmur heard. Pulmonary:     Effort: Pulmonary effort is normal. No respiratory distress.     Breath sounds: Normal breath sounds. No wheezing or rales.  Abdominal:     General: Bowel sounds are normal. There is no distension.     Palpations: Abdomen is soft. There is no mass.     Tenderness: There is no abdominal tenderness.  Musculoskeletal:        General: No tenderness. Normal range of motion.     Cervical back: Normal range of motion and neck supple.     Right lower leg: No edema.     Left lower leg: No edema.     Comments: Upper / Lower Extremities: - Normal muscle tone, strength bilateral upper extremities 5/5, lower extremities 5/5  Lymphadenopathy:     Cervical: No cervical adenopathy.  Skin:    General: Skin is warm and dry.     Findings: No erythema or rash.  Neurological:     Mental Status: She is alert and oriented to person, place, and time.     Comments: Distal sensation intact to light touch all extremities  Psychiatric:        Mood and Affect: Mood normal.        Behavior: Behavior normal.        Thought Content: Thought content normal.      Comments: Well groomed, good eye contact, normal speech and thoughts     Diabetic Foot Exam - Simple   Simple Foot Form Diabetic Foot exam was performed with the following findings: Yes 11/20/2024  8:58 AM  Visual Inspection No deformities, no ulcerations, no other skin breakdown bilaterally: Yes Sensation Testing Intact to touch and monofilament testing bilaterally: Yes Pulse Check Posterior Tibialis and Dorsalis pulse intact bilaterally: Yes Comments     CLINICAL DATA:  Risk stratification   EXAM: Coronary Calcium Score   TECHNIQUE: The patient was scanned on a Siemens Somatom scanner. Axial non-contrast 3 mm slices were carried out through the heart. The data set was analyzed on a dedicated work station and scored using the Agatson method.   FINDINGS: Non-cardiac: See separate report from [facility].  Ascending Aorta: Normal size   Pericardium: Normal   Coronary arteries: Normal origin of left and right coronary arteries. Distribution of arterial calcifications if present, as noted below;   LM 0   LAD 0   LCx 0   RCA 0   Total 0   IMPRESSION AND RECOMMENDATION: 1. Coronary calcium score of 0.   2. CAC 0, CAC-DRS A0.   3. Continue heart healthy lifestyle and risk factor modification.   Electronically Signed: By: Redell Cave M.D. On: 11/22/2023 15:05  Results for orders placed or performed in visit on 11/06/24  VITAMIN D  25 Hydroxy (Vit-D Deficiency, Fractures)   Collection Time: 11/06/24  7:55 AM  Result Value Ref Range   Vit D, 25-Hydroxy 42 30 - 100 ng/mL  Comprehensive metabolic panel with GFR   Collection Time: 11/06/24  7:55 AM  Result Value Ref Range   Glucose, Bld 97 65 - 99 mg/dL   BUN 15 7 - 25 mg/dL   Creat 9.41 9.49 - 8.94 mg/dL   eGFR 896 > OR = 60 fO/fpw/8.26f7   BUN/Creatinine Ratio SEE NOTE: 6 - 22 (calc)   Sodium 140 135 - 146 mmol/L   Potassium 4.4 3.5 - 5.3 mmol/L   Chloride 104 98 - 110 mmol/L   CO2 28 20  - 32 mmol/L   Calcium 9.8 8.6 - 10.4 mg/dL   Total Protein 7.4 6.1 - 8.1 g/dL   Albumin 4.5 3.6 - 5.1 g/dL   Globulin 2.9 1.9 - 3.7 g/dL (calc)   AG Ratio 1.6 1.0 - 2.5 (calc)   Total Bilirubin 0.6 0.2 - 1.2 mg/dL   Alkaline phosphatase (APISO) 126 37 - 153 U/L   AST 17 10 - 35 U/L   ALT 15 6 - 29 U/L  TSH   Collection Time: 11/06/24  7:55 AM  Result Value Ref Range   TSH 1.43 0.40 - 4.50 mIU/L  Microalbumin / creatinine urine ratio   Collection Time: 11/06/24  7:55 AM  Result Value Ref Range   Creatinine, Urine 191 20 - 275 mg/dL   Microalb, Ur 1.0 mg/dL   Microalb Creat Ratio 5 <30 mg/g creat  CBC with Differential/Platelet   Collection Time: 11/06/24  7:55 AM  Result Value Ref Range   WBC 8.3 3.8 - 10.8 Thousand/uL   RBC 4.73 3.80 - 5.10 Million/uL   Hemoglobin 13.6 11.7 - 15.5 g/dL   HCT 56.6 64.0 - 53.9 %   MCV 91.5 81.4 - 101.7 fL   MCH 28.8 27.0 - 33.0 pg   MCHC 31.4 (L) 31.6 - 35.4 g/dL   RDW 86.5 88.9 - 84.9 %   Platelets 399 140 - 400 Thousand/uL   MPV 9.9 7.5 - 12.5 fL   Neutro Abs 5,403 1,500 - 7,800 cells/uL   Absolute Lymphocytes 2,349 850 - 3,900 cells/uL   Absolute Monocytes 465 200 - 950 cells/uL   Eosinophils Absolute 50 15 - 500 cells/uL   Basophils Absolute 33 0 - 200 cells/uL   Neutrophils Relative % 65.1 %   Total Lymphocyte 28.3 %   Monocytes Relative 5.6 %   Eosinophils Relative 0.6 %   Basophils Relative 0.4 %  Hemoglobin A1c   Collection Time: 11/06/24  7:55 AM  Result Value Ref Range   Hgb A1c MFr Bld 5.5 <5.7 %   Mean Plasma Glucose 111 mg/dL   eAG (mmol/L) 6.2 mmol/L  Lipid panel   Collection Time: 11/06/24  7:55 AM  Result Value Ref Range  Cholesterol 207 (H) <200 mg/dL   HDL 56 > OR = 50 mg/dL   Triglycerides 878 <849 mg/dL   LDL Cholesterol (Calc) 128 (H) mg/dL (calc)   Total CHOL/HDL Ratio 3.7 <5.0 (calc)   Non-HDL Cholesterol (Calc) 151 (H) <130 mg/dL (calc)      Assessment & Plan:   Problem List Items Addressed This  Visit     Fluid retention   Relevant Medications   hydrochlorothiazide  (HYDRODIURIL ) 25 MG tablet   Generalized anxiety disorder with panic attacks   Relevant Medications   escitalopram  (LEXAPRO ) 5 MG tablet   Major depressive disorder, recurrent, in partial remission   Relevant Medications   escitalopram  (LEXAPRO ) 5 MG tablet   Spinal stenosis of lumbar region without neurogenic claudication   Relevant Medications   meloxicam  (MOBIC ) 15 MG tablet   Type 2 diabetes mellitus with other specified complication (HCC)   Other Visit Diagnoses       Annual physical exam    -  Primary     Encounter for screening mammogram for malignant neoplasm of breast       Relevant Orders   MM 3D SCREENING MAMMOGRAM BILATERAL BREAST     Environmental and seasonal allergies       Relevant Medications   montelukast  (SINGULAIR ) 10 MG tablet     HSV-2 (herpes simplex virus 2) infection       Relevant Medications   valACYclovir  (VALTREX ) 500 MG tablet     Long-term current use of injectable noninsulin antidiabetic medication         OSA on CPAP            Updated Health Maintenance information Reviewed recent lab results with patient Encouraged improvement to lifestyle with diet and exercise Goal of weight loss  Major depressive disorder, recurrent, in partial remission Mood improved with Lexapro , indicating partial remission. - Continue Lexapro  as prescribed.  Generalized anxiety disorder with panic attacks Anxiety managed with Buspar  as needed. - Continue Buspar  as needed.  Type 2 diabetes mellitus with other specified complication with Hyperlipidemia A1c 5.5, excellent glycemic control. Weight loss positively impacted management. - Continue current diabetes management plan. Mounjaro  5mg  weekly inj, no dose adjustment   Hypertension with fluid retention Blood pressure well controlled. Hydrochlorothiazide  effective for fluid retention. - Continue hydrochlorothiazide  at current dose. -  Monitor for signs of dehydration or hypotension.  Obstructive sleep apnea Compliant with CPAP therapy, reports good sleep quality. - Continue CPAP therapy.  Lumbar spinal stenosis Meloxicam  used more frequently as advised. Kidney function stable. - Continue meloxicam  as needed for pain management.  Allergic rhinitis Fewer allergy attacks reported. - Continue Singulair  as prescribed.  Herpes simplex virus infection Valtrex  used for flare-ups. - Prescribed Valtrex  30 pills for flare-ups.  General Health Maintenance Hesitant about shingles vaccine. Pneumonia vaccine recommended. Mammogram due February 2026. Colon and cervical cancer screenings up to date. Pausing COVID boosters. - Consider pneumonia vaccine. - Schedule mammogram for February 2026. - Continue to pause COVID boosters.         Orders Placed This Encounter  Procedures   MM 3D SCREENING MAMMOGRAM BILATERAL BREAST    Standing Status:   Future    Expiration Date:   11/20/2025    Reason for Exam (SYMPTOM  OR DIAGNOSIS REQUIRED):   Screening bilateral 3D Mammogram Tomo    Preferred imaging location?:   [facility]    Meds ordered this encounter  Medications   escitalopram  (LEXAPRO ) 5 MG tablet    Sig:  Take 1 tablet (5 mg total) by mouth daily.    Dispense:  90 tablet    Refill:  3    Please update to 90 day rx   hydrochlorothiazide  (HYDRODIURIL ) 25 MG tablet    Sig: Take 1 tablet (25 mg total) by mouth daily.    Dispense:  90 tablet    Refill:  3    Add extra refills   meloxicam  (MOBIC ) 15 MG tablet    Sig: Take 1 tablet (15 mg total) by mouth as needed.    Dispense:  90 tablet    Refill:  3   montelukast  (SINGULAIR ) 10 MG tablet    Sig: Take 1 tablet (10 mg total) by mouth at bedtime.    Dispense:  90 tablet    Refill:  3    Add extra refills   valACYclovir  (VALTREX ) 500 MG tablet    Sig: TAKE 1 TABLET (500 MG TOTAL) BY MOUTH 3 (THREE) TIMES DAILY AS NEEDED FOR FLARE    Dispense:  30 tablet     Refill:  2     Follow up plan: Return for 6 month DM A1c, mood, weight updates.  Marsa Officer, DO [facility] 11/20/2024, 8:52 AM  "

## 2024-11-20 NOTE — Patient Instructions (Addendum)
 Thank you for coming to the office today.  For Mammogram screening for breast cancer   Call the Imaging Center below anytime to schedule your own appointment now that order has been placed.  [facility] [address] [place], [place] [address] Phone: [phone]  Future Prevnar-20 vaccine + Shingles vaccine  Refilled medications  Keep Mounjaro  5mg  weekly  Okay to take Meloxicam  more, kidneys look fine!  Please schedule a Follow-up Appointment to: Return for 6 month DM A1c, mood, weight updates.  If you have any other questions or concerns, please feel free to call the office or send a message through MyChart. You may also schedule an earlier appointment if necessary.  Additionally, you may be receiving a survey about your experience at our office within a few days to 1 week by e-mail or mail. We value your feedback.  Marsa Officer, DO [facility], [place]

## 2024-11-28 ENCOUNTER — Ambulatory Visit: Admitting: Psychology

## 2024-12-23 ENCOUNTER — Ambulatory Visit

## 2025-05-15 ENCOUNTER — Other Ambulatory Visit

## 2025-05-21 ENCOUNTER — Ambulatory Visit: Admitting: Family Medicine
